# Patient Record
Sex: Male | Born: 1940
Health system: Southern US, Community
[De-identification: ages and names within clinical notes are randomized; demographics above are authoritative.]

## PROBLEM LIST (undated history)

## (undated) DIAGNOSIS — Z973 Presence of spectacles and contact lenses: Secondary | ICD-10-CM

## (undated) DIAGNOSIS — K08109 Complete loss of teeth, unspecified cause, unspecified class: Secondary | ICD-10-CM

## (undated) DIAGNOSIS — Z972 Presence of dental prosthetic device (complete) (partial): Secondary | ICD-10-CM

## (undated) DIAGNOSIS — Z87448 Personal history of other diseases of urinary system: Secondary | ICD-10-CM

## (undated) DIAGNOSIS — K219 Gastro-esophageal reflux disease without esophagitis: Secondary | ICD-10-CM

## (undated) DIAGNOSIS — Z87442 Personal history of urinary calculi: Secondary | ICD-10-CM

## (undated) DIAGNOSIS — K449 Diaphragmatic hernia without obstruction or gangrene: Secondary | ICD-10-CM

## (undated) DIAGNOSIS — E7800 Pure hypercholesterolemia, unspecified: Secondary | ICD-10-CM

## (undated) DIAGNOSIS — Z974 Presence of external hearing-aid: Secondary | ICD-10-CM

## (undated) DIAGNOSIS — N400 Enlarged prostate without lower urinary tract symptoms: Secondary | ICD-10-CM

## (undated) DIAGNOSIS — C679 Malignant neoplasm of bladder, unspecified: Secondary | ICD-10-CM

## (undated) HISTORY — DX: Pure hypercholesterolemia, unspecified: E78.00

## (undated) HISTORY — PX: OTHER SURGICAL HISTORY: SHX169

## (undated) HISTORY — PX: COLONOSCOPY WITH ESOPHAGOGASTRODUODENOSCOPY (EGD): SHX5779

## (undated) HISTORY — DX: Gastro-esophageal reflux disease without esophagitis: K21.9

## (undated) HISTORY — PX: VASECTOMY: SHX75

---

## 2001-01-21 ENCOUNTER — Encounter: Payer: Self-pay | Admitting: Urology

## 2001-01-21 ENCOUNTER — Ambulatory Visit (HOSPITAL_COMMUNITY): Admission: RE | Admit: 2001-01-21 | Discharge: 2001-01-21 | Payer: Self-pay | Admitting: Urology

## 2001-06-20 ENCOUNTER — Emergency Department (HOSPITAL_COMMUNITY): Admission: EM | Admit: 2001-06-20 | Discharge: 2001-06-21 | Payer: Self-pay | Admitting: Emergency Medicine

## 2001-06-21 ENCOUNTER — Encounter: Payer: Self-pay | Admitting: Emergency Medicine

## 2002-06-07 ENCOUNTER — Ambulatory Visit (HOSPITAL_COMMUNITY): Admission: RE | Admit: 2002-06-07 | Discharge: 2002-06-07 | Payer: Self-pay | Admitting: Internal Medicine

## 2003-05-31 ENCOUNTER — Observation Stay (HOSPITAL_COMMUNITY): Admission: RE | Admit: 2003-05-31 | Discharge: 2003-06-01 | Payer: Self-pay | Admitting: General Surgery

## 2005-05-23 ENCOUNTER — Ambulatory Visit (HOSPITAL_COMMUNITY): Admission: RE | Admit: 2005-05-23 | Discharge: 2005-05-23 | Payer: Self-pay | Admitting: Pulmonary Disease

## 2005-11-18 ENCOUNTER — Ambulatory Visit (HOSPITAL_COMMUNITY): Admission: RE | Admit: 2005-11-18 | Discharge: 2005-11-18 | Payer: Self-pay | Admitting: Pulmonary Disease

## 2006-01-01 ENCOUNTER — Ambulatory Visit: Payer: Self-pay | Admitting: Internal Medicine

## 2006-10-13 HISTORY — PX: UMBILICAL HERNIA REPAIR: SHX196

## 2011-05-12 ENCOUNTER — Other Ambulatory Visit (HOSPITAL_COMMUNITY): Payer: Self-pay | Admitting: Internal Medicine

## 2011-05-12 DIAGNOSIS — R17 Unspecified jaundice: Secondary | ICD-10-CM

## 2011-05-14 ENCOUNTER — Ambulatory Visit (HOSPITAL_COMMUNITY)
Admission: RE | Admit: 2011-05-14 | Discharge: 2011-05-14 | Disposition: A | Discharge status: Home / Self Care | Payer: Medicare Other | Source: Ambulatory Visit | Attending: Internal Medicine | Admitting: Internal Medicine

## 2011-05-14 DIAGNOSIS — K7689 Other specified diseases of liver: Secondary | ICD-10-CM | POA: Insufficient documentation

## 2011-05-14 DIAGNOSIS — R17 Unspecified jaundice: Secondary | ICD-10-CM

## 2011-05-26 ENCOUNTER — Ambulatory Visit (INDEPENDENT_AMBULATORY_CARE_PROVIDER_SITE_OTHER): Payer: Medicare Other | Admitting: Gastroenterology

## 2011-05-26 ENCOUNTER — Encounter: Payer: Self-pay | Admitting: Gastroenterology

## 2011-05-26 VITALS — BP 141/89 | HR 68 | Temp 97.8°F | Ht 68.0 in | Wt 173.0 lb

## 2011-05-26 DIAGNOSIS — R1031 Right lower quadrant pain: Secondary | ICD-10-CM | POA: Insufficient documentation

## 2011-05-26 DIAGNOSIS — R17 Unspecified jaundice: Secondary | ICD-10-CM | POA: Insufficient documentation

## 2011-05-26 NOTE — Progress Notes (Signed)
Cc to PCP 

## 2011-05-26 NOTE — Progress Notes (Signed)
Referring Provider: Dwana Melena, MD Primary Care Physician:  Dwana Melena, MD Primary Gastroenterologist:  Dr. Jena Gauss   Chief Complaint  Patient presents with  . Abdominal Pain  . Abnormal Lab    HPI:   [name] is a pleasant 70 year old Caucasian male who presents today at the request of Dr. Margo Aye secondary to elevated total bilirubin. All other LFTs normal. Tbili was 2.0 on July 2012. [place] of abdomen August 1 with question fatty infiltration of liver. CBD 3mm diameter normal.  Pt denies any fever or chills. Denies lack of appetite. Weight is stable. Reports RLQ discomfort, sometimes wrapping around to back, X 1 month. Intermittent, mainly with movement. Works part-time, does lifting/bending/pulling. Described as "having to go to bathroom". No alleviating factors. Takes Advil prn. Denies melena or brbpr.   Last colonoscopy in 2003 with normal rectum, scattered AVMs throughout sigmoid segment. Has distant family hx of colon cancer, with paternal grandfather.   Pt states prior to being told about "fatty liver", he did not notice the RLQ discomfort. States he wonders if he is worried about it now and notices it more.   Past Medical History  Diagnosis Date  . Kidney stones   . Bladder stone   . Hypercholesterolemia   . GERD (gastroesophageal reflux disease)   . S/P colonoscopy Aug 2003     Normal rectum. Scattered AVMs throughout sigmoid segment     Past Surgical History  Procedure Date  . Umbilical hernia repair   . Mole removal   . Cystoscopy     Current Outpatient Prescriptions  Medication Sig Dispense Refill  . aspirin 325 MG tablet Take 325 mg by mouth daily.        . niacin (NIASPAN) 500 MG CR tablet Take 500 mg by mouth at bedtime.        Marland Kitchen omeprazole (PRILOSEC) 20 MG capsule Take 20 mg by mouth daily.          Allergies as of 05/26/2011  . (No Known Allergies)    Family History  Problem Relation Age of Onset  . Colon cancer Paternal Grandfather     History    Social History  . Marital Status: Single    Spouse Name: N/A    Number of Children: N/A  . Years of Education: N/A   Occupational History  . Not on file.   Social History Main Topics  . Smoking status: Former Games developer  . Smokeless tobacco: Not on file   Comment: quit around 1998  . Alcohol Use: Yes     Socially  . Drug Use: Not on file  . Sexually Active: Not on file    Review of Systems: Gen: Denies any fever, chills, loss of appetite, fatigue, weight loss. CV: Denies chest pain, heart palpitations, syncope, peripheral edema. Resp: Denies shortness of breath with rest, cough, wheezing GI: Denies dysphagia or odynophagia. Denies hematemesis, fecal incontinence, or jaundice.  GU : Denies urinary burning, urinary frequency, urinary incontinence.  MS: Denies joint pain, muscle weakness, cramps, limited movement Derm: Denies rash, itching, dry skin Psych: Denies depression, anxiety, confusion or memory loss  Heme: Denies bruising, bleeding, and enlarged lymph nodes.  Physical Exam: BP 141/89  Pulse 68  Temp(Src) 97.8 F (36.6 C) (Temporal)  Ht 5\' 8"  (1.727 m)  Wt 173 lb (78.472 kg)  BMI 26.30 kg/m2 General:   Alert and oriented. Well-developed, well-nourished, pleasant and cooperative. Head:  Normocephalic and atraumatic. Eyes:  Conjunctiva pink, sclera clear, no icterus.   Conjunctiva pink.  Ears:  Normal auditory acuity. Nose:  No deformity, discharge,  or lesions. Mouth:  No deformity or lesions, mucosa pink and moist.  Neck:  Supple, without mass or thyromegaly. Lungs:  Clear to auscultation bilaterally, without wheezing, rales, or rhonchi.  Heart:  S1, S2 present without murmurs noted.  Abdomen:  +BS, soft, mildly obese, non-tender and non-distended. Without mass or HSM. No rebound or guarding. No hernias noted. No CVA tenderness.  Rectal:  Deferred  Msk:  Symmetrical without gross deformities. Normal posture. Extremities:  Without clubbing or edema. Neurologic:   Alert and  oriented x4;  grossly normal neurologically. Skin:  Intact, warm and dry without significant lesions or rashes Cervical Nodes:  No significant cervical adenopathy. Psych:  Alert and cooperative. Normal mood and affect.

## 2011-05-26 NOTE — Patient Instructions (Signed)
Please complete stool sample and return to our office. We may be proceeding with a colonoscopy if indicated. Also, complete labs. We will contact you with the results and further plan.   Please start incorporating high fiber foods to your diet. See handout.   Recommend 1-2# weight loss per week until ideal body weight through exercise & diet. Low fat/cholesterol diet. Gradually increase exercise from 15 min daily up to 1 hr per day 5 days/week. Limit alcohol use.   We will see you back in 6 months. If anything needs to be done in interim after review of labs/stool sample, we will do so.   Thanks!

## 2011-05-26 NOTE — Assessment & Plan Note (Signed)
70 year old Caucasian male with only elevated bilirubin at 2.0 on recent CMP. All other LFTs completely normal. [place] of abdomen with fatty liver, no CBD dilation, stones. RLQ discomfort, wrapping to back intermittently, X 1 month, worsened with activity. No melena or brbpr. No change in bowel habits. Otherwise asymptomatic. No wt loss, fever, chills, jaundice. Will proceed with hepatic function panel. Question of Gilbert's syndrome remains in differential. Fatty liver on [place] with need for dietary/behavior modification. Will proceed with further work-up as needed after review of updated HFP. RLQ etiology unclear at this time; addressed under "RLQ pain".   HFP Recommend 1-2# weight loss per week until ideal body weight through exercise & diet. Low fat/cholesterol diet. Gradually increase exercise from 15 min daily up to 1 hr per day 5 days/week. Limit alcohol use. Further recommendations after labs reviewed. Consider viral markers, etc.

## 2011-05-26 NOTE — Assessment & Plan Note (Signed)
Intermittent X 1 month, sometimes wrapping around to back. Worsened with movement. Works part-time doing bending/lifting/pulling. No change in bowel habits, but does report it feeling like "has to have a BM". No alleviating factors. No melena or brbpr. Question musculoskeletal etiology vs GI. Last colonoscopy in 2003. Distant hx of colon cancer in family (paternal grandfather). Obtain ifobt. Consider colonoscopy in near future vs CT scan, depending on ifobt findings. In interim, high fiber diet. Further recommendations to follow.  6 months follow-up.

## 2011-05-29 ENCOUNTER — Ambulatory Visit (INDEPENDENT_AMBULATORY_CARE_PROVIDER_SITE_OTHER): Payer: Medicare Other | Admitting: Gastroenterology

## 2011-05-29 DIAGNOSIS — R109 Unspecified abdominal pain: Secondary | ICD-10-CM

## 2011-05-29 NOTE — Progress Notes (Signed)
Noted.   Has pt completed labs (HFP?)

## 2011-06-02 NOTE — Progress Notes (Signed)
Mailed letter to pt

## 2011-06-03 LAB — HEPATIC FUNCTION PANEL
ALT: 17 U/L (ref 0–53)
Alkaline Phosphatase: 70 U/L (ref 39–117)
Indirect Bilirubin: 2 mg/dL — ABNORMAL HIGH (ref 0.0–0.9)
Total Protein: 6.5 g/dL (ref 6.0–8.3)

## 2011-06-10 NOTE — Progress Notes (Signed)
Quick Note:  Tried to call pt- NA ______ 

## 2011-06-11 ENCOUNTER — Telehealth: Payer: Self-pay

## 2011-06-11 ENCOUNTER — Other Ambulatory Visit: Payer: Self-pay

## 2011-06-11 DIAGNOSIS — R945 Abnormal results of liver function studies: Secondary | ICD-10-CM

## 2011-06-11 NOTE — Telephone Encounter (Signed)
Pt called and was informed of his iFOBT results negative, and also of his LFT's. He is aware to repeat labs in 3 months. He said he is not having pain now and is doing much better. Lab orders on file.

## 2011-06-17 ENCOUNTER — Other Ambulatory Visit: Payer: Self-pay | Admitting: Gastroenterology

## 2011-07-29 ENCOUNTER — Telehealth: Payer: Self-pay | Admitting: Gastroenterology

## 2011-07-29 NOTE — Telephone Encounter (Signed)
Pt came to window wanting to set up CT. He was told if he continued having abd pain to call or come by and we would set him up. I told [name] that I would have someone call him or he could wait. He said to call him at [phone] or [phone]

## 2011-07-30 NOTE — Telephone Encounter (Signed)
Pt came by to schedule CT- would you like for me to schedule it or have him come in for an OV and do it at that time

## 2011-07-30 NOTE — Telephone Encounter (Signed)
If he is agreeable, set up CT prior to OV so results may be reviewed at that time.  Have HFP, Creatinine drawn prior to CT.   Continue high fiber diet Miralax 17 grams daily as needed for BM This way, if colonoscopy needs to be set up at our appt, we can do so then.

## 2011-07-31 ENCOUNTER — Other Ambulatory Visit: Payer: Self-pay | Admitting: Gastroenterology

## 2011-07-31 NOTE — Telephone Encounter (Signed)
Pt advised of plan - will have blood work prior to CT and OV after CT-

## 2011-07-31 NOTE — Telephone Encounter (Signed)
CD has informed pt. Lab order faxed to lab.

## 2011-07-31 NOTE — Telephone Encounter (Signed)
Tried to call pt- LMOM 

## 2011-08-01 ENCOUNTER — Other Ambulatory Visit: Payer: Self-pay | Admitting: Gastroenterology

## 2011-08-02 LAB — HEPATIC FUNCTION PANEL
Alkaline Phosphatase: 81 U/L (ref 39–117)
Bilirubin, Direct: 0.4 mg/dL — ABNORMAL HIGH (ref 0.0–0.3)
Indirect Bilirubin: 1.9 mg/dL — ABNORMAL HIGH (ref 0.0–0.9)
Total Protein: 7 g/dL (ref 6.0–8.3)

## 2011-08-05 ENCOUNTER — Other Ambulatory Visit: Payer: Self-pay | Admitting: Gastroenterology

## 2011-08-05 DIAGNOSIS — R109 Unspecified abdominal pain: Secondary | ICD-10-CM

## 2011-08-05 NOTE — Telephone Encounter (Signed)
CT scheduled for 08/07/11 @ 11:00- LMOM

## 2011-08-07 ENCOUNTER — Telehealth: Payer: Self-pay | Admitting: Gastroenterology

## 2011-08-07 ENCOUNTER — Ambulatory Visit (HOSPITAL_COMMUNITY): Payer: Medicare Other

## 2011-08-07 NOTE — Telephone Encounter (Signed)
Pt is scheduled for CT on 11/01

## 2011-08-07 NOTE — Telephone Encounter (Signed)
Did pt reschedule CT or not show for the scheduled appt?

## 2011-08-14 ENCOUNTER — Ambulatory Visit (HOSPITAL_COMMUNITY)
Admission: RE | Admit: 2011-08-14 | Discharge: 2011-08-14 | Disposition: A | Discharge status: Home / Self Care | Payer: Medicare Other | Source: Ambulatory Visit | Attending: Gastroenterology | Admitting: Gastroenterology

## 2011-08-14 ENCOUNTER — Other Ambulatory Visit: Payer: Self-pay | Admitting: Gastroenterology

## 2011-08-14 ENCOUNTER — Ambulatory Visit (HOSPITAL_COMMUNITY)
Admission: RE | Admit: 2011-08-14 | Discharge: 2011-08-14 | Disposition: A | Discharge status: Home / Self Care | Payer: Medicare Other | Source: Ambulatory Visit | Attending: Internal Medicine | Admitting: Internal Medicine

## 2011-08-14 DIAGNOSIS — R109 Unspecified abdominal pain: Secondary | ICD-10-CM

## 2011-08-14 DIAGNOSIS — R1031 Right lower quadrant pain: Secondary | ICD-10-CM

## 2011-08-14 DIAGNOSIS — M5126 Other intervertebral disc displacement, lumbar region: Secondary | ICD-10-CM | POA: Insufficient documentation

## 2011-08-14 DIAGNOSIS — N400 Enlarged prostate without lower urinary tract symptoms: Secondary | ICD-10-CM | POA: Insufficient documentation

## 2011-08-14 MED ORDER — IOHEXOL 300 MG/ML  SOLN
100.0000 mL | Freq: Once | INTRAMUSCULAR | Status: AC | PRN
  Administered 2011-08-14: 100 mL via INTRAVENOUS

## 2011-08-15 NOTE — Progress Notes (Signed)
Quick Note:  Similar to 2 mos ago. CT without significant findings. Let's get a CBC on him. I thought we had one on file. How is RLQ discomfort? Bowel movements? Offer f/u visit if needed (was to be seen in 6 mos which would be Jan, but can move up if needed). ______

## 2011-08-15 NOTE — Progress Notes (Signed)
Quick Note:  Normal. Only mild enlargement of prostate. Please have pt follow-up with his PCP regarding this. ______

## 2011-08-18 ENCOUNTER — Other Ambulatory Visit: Payer: Self-pay | Admitting: Gastroenterology

## 2011-08-28 ENCOUNTER — Other Ambulatory Visit: Payer: Self-pay | Admitting: Gastroenterology

## 2011-08-29 LAB — CBC WITH DIFFERENTIAL/PLATELET
Basophils Relative: 0 % (ref 0–1)
Eosinophils Absolute: 0.2 10*3/uL (ref 0.0–0.7)
HCT: 45.8 % (ref 39.0–52.0)
Hemoglobin: 15 g/dL (ref 13.0–17.0)
Lymphs Abs: 1.9 10*3/uL (ref 0.7–4.0)
MCH: 31.4 pg (ref 26.0–34.0)
MCHC: 32.8 g/dL (ref 30.0–36.0)
MCV: 96 fL (ref 78.0–100.0)
Monocytes Absolute: 0.5 10*3/uL (ref 0.1–1.0)
Monocytes Relative: 7 % (ref 3–12)
Neutrophils Relative %: 64 % (ref 43–77)
RBC: 4.77 MIL/uL (ref 4.22–5.81)

## 2011-09-08 NOTE — Progress Notes (Signed)
Quick Note:  No anemia. Offer f/u visit to pt if needed, especially if still having RLQ pain. CT without significant findings. ______

## 2011-09-09 NOTE — Progress Notes (Signed)
Quick Note:  Tried to call pt- LMOM ______ 

## 2011-11-17 ENCOUNTER — Encounter: Payer: Self-pay | Admitting: Internal Medicine

## 2012-02-09 ENCOUNTER — Encounter: Payer: Self-pay | Admitting: Gastroenterology

## 2012-02-09 NOTE — Progress Notes (Unsigned)
Needs OV to set up TCS.  Hx of Gilbert's.

## 2012-03-05 ENCOUNTER — Encounter: Payer: Self-pay | Admitting: Gastroenterology

## 2012-03-05 NOTE — Progress Notes (Signed)
Pt is aware of OV on 6/14 at 11 with KJ and appt card was mailed

## 2012-03-26 ENCOUNTER — Encounter: Payer: Self-pay | Admitting: Urgent Care

## 2012-03-26 ENCOUNTER — Ambulatory Visit (INDEPENDENT_AMBULATORY_CARE_PROVIDER_SITE_OTHER): Payer: Medicare Other | Admitting: Urgent Care

## 2012-03-26 ENCOUNTER — Other Ambulatory Visit: Payer: Self-pay | Admitting: Internal Medicine

## 2012-03-26 VITALS — BP 134/87 | HR 67 | Temp 98.2°F | Ht 69.0 in | Wt 172.6 lb

## 2012-03-26 DIAGNOSIS — R109 Unspecified abdominal pain: Secondary | ICD-10-CM

## 2012-03-26 DIAGNOSIS — R1031 Right lower quadrant pain: Secondary | ICD-10-CM

## 2012-03-26 DIAGNOSIS — K219 Gastro-esophageal reflux disease without esophagitis: Secondary | ICD-10-CM | POA: Insufficient documentation

## 2012-03-26 DIAGNOSIS — Z1211 Encounter for screening for malignant neoplasm of colon: Secondary | ICD-10-CM

## 2012-03-26 DIAGNOSIS — R17 Unspecified jaundice: Secondary | ICD-10-CM

## 2012-03-26 MED ORDER — OMEPRAZOLE 20 MG PO CPDR: 20.0000 mg | DELAYED_RELEASE_CAPSULE | Freq: Every day | ORAL | Status: DC

## 2012-03-26 MED ORDER — PEG 3350-KCL-NA BICARB-NACL 420 G PO SOLR: ORAL | Status: AC

## 2012-03-26 NOTE — Progress Notes (Signed)
Primary Care Physician:  HALL,ZACK, MD Primary Gastroenterologist:  Dr. Rourk  Chief Complaint  Patient presents with  . Colonoscopy    HPI:  [name] is a 70 y.o. male here to set up screening colonoscopy.  He had previously been seen by Anna Sams, NP & had some abdominal pain.  This has completely resolved.  Ultrasound showed fatty liver 05/2011.  CT A/P showed prostatomegaly, but was otherwise normal.  Hx indirect hyperbilirubinemia.  He feels very well.  Hx chronic GERD well controlled on  omeprazole 20mg daily.  Denies dysphagia or odynophagia.   Denies any lower GI symptoms including constipation, diarrhea, rectal bleeding, melena or weight loss. Component     Latest Ref Rng 05/26/2011 08/01/2011  Total Bilirubin     0.3 - 1.2 mg/dL 2.3 (H) 2.3 (H)  Bilirubin, Direct     0.0 - 0.3 mg/dL 0.3 0.4 (H)  Indirect Bilirubin     0.0 - 0.9 mg/dL 2.0 (H) 1.9 (H)  Alkaline Phosphatase     39 - 117 U/L 70 81  AST     0 - 37 U/L 19 22  ALT     0 - 53 U/L 17 23  Total Protein     6.0 - 8.3 g/dL 6.5 7.0  Albumin     3.5 - 5.2 g/dL 4.4 4.8     Past Medical History  Diagnosis Date  . Kidney stones   . Bladder stone   . Hypercholesterolemia   . GERD (gastroesophageal reflux disease)   . S/P colonoscopy Aug 2003     Normal rectum. Scattered AVMs throughout sigmoid segment   . Fatty liver     Past Surgical History  Procedure Date  . Umbilical hernia repair 2008  . Mole removal   . Cystoscopy     Current Outpatient Prescriptions  Medication Sig Dispense Refill  . aspirin 325 MG tablet Take 325 mg by mouth daily.        . fenofibrate (TRICOR) 145 MG tablet Take 1 capsule by mouth Daily.      . niacin (NIASPAN) 500 MG CR tablet Take 500 mg by mouth at bedtime.        . omeprazole (PRILOSEC) 20 MG capsule Take 1 capsule (20 mg total) by mouth daily.  90 capsule  3  . DISCONTD: omeprazole (PRILOSEC) 20 MG capsule Take 20 mg by mouth daily.        . polyethylene  glycol-electrolytes (TRILYTE) 420 G solution Use as directed Also buy 1 fleet enema & 4 dulcolax tablets to use as directed  4000 mL  0    Allergies as of 03/26/2012  . (No Known Allergies)    Family History  Problem Relation Age of Onset  . Colon cancer Paternal Grandfather     History   Social History  . Marital Status: Single    Spouse Name: N/A    Number of Children: 2  . Years of Education: N/A   Occupational History  . retired, [employer]    Social History Main Topics  . Smoking status: Former Smoker    Quit date: 03/26/1998  . Smokeless tobacco: Not on file   Comment: quit around 1998  . Alcohol Use: Yes     Socially beer couple times week  . Drug Use: No  . Sexually Active: Not on file   Other Topics Concern  . Not on file   Social History Narrative   Lives w/ wife      Review of Systems: Gen: Denies any fever, chills, sweats, anorexia, fatigue, weakness, malaise, weight loss, and sleep disorder CV: Denies chest pain, angina, palpitations, syncope, orthopnea, PND, peripheral edema, and claudication. Resp: Denies dyspnea at rest, dyspnea with exercise, cough, sputum, wheezing, coughing up blood, and pleurisy. GI: Denies vomiting blood, jaundice, and fecal incontinence. GU : Denies urinary burning, blood in urine, urinary frequency, urinary hesitancy, nocturnal urination, and urinary incontinence. MS: Denies joint pain, limitation of movement, and swelling, stiffness, low back pain, extremity pain. Denies muscle weakness, cramps, atrophy.  Derm: Denies rash, itching, dry skin, hives, moles, warts, or unhealing ulcers.  Psych: Denies depression, anxiety, memory loss, suicidal ideation, hallucinations, paranoia, and confusion. Heme: Denies bruising, bleeding, and enlarged lymph nodes. Neuro:  Denies any headaches, dizziness, paresthesias. Endo:  Denies any problems with DM, thyroid, adrenal function.  Physical Exam: BP 134/87  Pulse 67  Temp 98.2 F (36.8  C) (Temporal)  Ht 5' 9" (1.753 m)  Wt 172 lb 9.6 oz (78.291 kg)  BMI 25.49 kg/m2 General:   Alert,  Well-developed, well-nourished, pleasant and cooperative in NAD Head:  Normocephalic and atraumatic. Eyes:  Sclera clear, no icterus.   Conjunctiva pink. Ears:  Normal auditory acuity. Nose:  No deformity, discharge, or lesions. Mouth:  No deformity or lesions,oropharynx pink & moist. Neck:  Supple; no masses or thyromegaly. Lungs:  Clear throughout to auscultation.   No wheezes, crackles, or rhonchi. No acute distress. Heart:  Regular rate and rhythm; no murmurs, clicks, rubs,  or gallops. Abdomen:  Normal bowel sounds.  No bruits.  Soft, non-tender and non-distended without masses, hepatosplenomegaly or hernias noted.  No guarding or rebound tenderness.   Rectal:  Deferred. Msk:  Symmetrical without gross deformities. Normal posture. Pulses:  Normal pulses noted. Extremities:  No clubbing or edema. Neurologic:  Alert and  oriented x4;  grossly normal neurologically. Skin:  Intact without significant lesions or rashes. Lymph Nodes:  No significant cervical adenopathy. Psych:  Alert and cooperative. Normal mood and affect.   

## 2012-03-26 NOTE — Assessment & Plan Note (Signed)
[name] is a pleasant 71 y.o. male here to set up screening colonoscopy.  I have discussed risks & benefits which include, but are not limited to, bleeding, infection, perforation & drug reaction.  The patient agrees with this plan & written consent will be obtained.

## 2012-03-26 NOTE — Assessment & Plan Note (Signed)
Resolved

## 2012-03-26 NOTE — Patient Instructions (Addendum)
Omeprazole 20mg  daily Screening colonoscopy & EGD w/ Dr Jena Gauss in the near future

## 2012-03-26 NOTE — Assessment & Plan Note (Addendum)
Chronic GERD well controlled on omeprazole 20mg  daily.  EGD to screen for Barrett's esophagus at the same time as colonoscopy.  I have discussed risks & benefits which include, but are not limited to, bleeding, infection, perforation & drug reaction.  The patient agrees with this plan & written consent will be obtained.

## 2012-03-26 NOTE — Assessment & Plan Note (Signed)
Indirect hyperbilirubinemia (Gilbert's) in setting of fatty liver.

## 2012-03-29 ENCOUNTER — Telehealth: Payer: Self-pay | Admitting: General Practice

## 2012-03-29 NOTE — Telephone Encounter (Signed)
I spoke with [name] and explained to him that his procedure is covered, however he has a $3,900.00 yearly deductible that he has only met $74.00 of.    He stated he would like to proceed with his procedure.   I confirmed insurance benefits with April.

## 2012-03-30 ENCOUNTER — Encounter (HOSPITAL_COMMUNITY): Payer: Self-pay | Admitting: Pharmacy Technician

## 2012-04-01 NOTE — Progress Notes (Signed)
Faxed to PCP

## 2012-04-08 ENCOUNTER — Ambulatory Visit (HOSPITAL_COMMUNITY)
Admission: RE | Admit: 2012-04-08 | Discharge: 2012-04-08 | Disposition: A | Discharge status: Home / Self Care | Payer: Medicare Other | Source: Ambulatory Visit | Attending: Internal Medicine | Admitting: Internal Medicine

## 2012-04-08 ENCOUNTER — Encounter (HOSPITAL_COMMUNITY): Payer: Self-pay

## 2012-04-08 ENCOUNTER — Encounter (HOSPITAL_COMMUNITY)
Admission: RE | Disposition: A | Discharge status: Home / Self Care | Payer: Self-pay | Source: Ambulatory Visit | Attending: Internal Medicine

## 2012-04-08 DIAGNOSIS — E7800 Pure hypercholesterolemia, unspecified: Secondary | ICD-10-CM | POA: Insufficient documentation

## 2012-04-08 DIAGNOSIS — Z1211 Encounter for screening for malignant neoplasm of colon: Secondary | ICD-10-CM

## 2012-04-08 DIAGNOSIS — D126 Benign neoplasm of colon, unspecified: Secondary | ICD-10-CM | POA: Insufficient documentation

## 2012-04-08 DIAGNOSIS — K621 Rectal polyp: Secondary | ICD-10-CM

## 2012-04-08 DIAGNOSIS — D128 Benign neoplasm of rectum: Secondary | ICD-10-CM | POA: Insufficient documentation

## 2012-04-08 DIAGNOSIS — K620 Anal polyp: Secondary | ICD-10-CM

## 2012-04-08 DIAGNOSIS — K2960 Other gastritis without bleeding: Secondary | ICD-10-CM

## 2012-04-08 DIAGNOSIS — K449 Diaphragmatic hernia without obstruction or gangrene: Secondary | ICD-10-CM | POA: Insufficient documentation

## 2012-04-08 DIAGNOSIS — K219 Gastro-esophageal reflux disease without esophagitis: Secondary | ICD-10-CM

## 2012-04-08 DIAGNOSIS — R109 Unspecified abdominal pain: Secondary | ICD-10-CM

## 2012-04-08 SURGERY — COLONOSCOPY WITH ESOPHAGOGASTRODUODENOSCOPY (EGD)
Anesthesia: Moderate Sedation

## 2012-04-08 MED ORDER — BUTAMBEN-TETRACAINE-BENZOCAINE 2-2-14 % EX AERO
INHALATION_SPRAY | CUTANEOUS | Status: DC | PRN
  Administered 2012-04-08: 2 via TOPICAL

## 2012-04-08 MED ORDER — MIDAZOLAM HCL 5 MG/5ML IJ SOLN
INTRAMUSCULAR | Status: DC | PRN
  Administered 2012-04-08: 1 mg via INTRAVENOUS
  Administered 2012-04-08: 2 mg via INTRAVENOUS
  Administered 2012-04-08: 1 mg via INTRAVENOUS
  Administered 2012-04-08: 2 mg via INTRAVENOUS

## 2012-04-08 MED ORDER — STERILE WATER FOR IRRIGATION IR SOLN
Status: DC | PRN
  Administered 2012-04-08: 07:00:00

## 2012-04-08 MED ORDER — MEPERIDINE HCL 100 MG/ML IJ SOLN
INTRAMUSCULAR | Status: AC
  Filled 2012-04-08: qty 2

## 2012-04-08 MED ORDER — SODIUM CHLORIDE 0.45 % IV SOLN
Freq: Once | INTRAVENOUS | Status: AC
  Administered 2012-04-08: 07:00:00 via INTRAVENOUS

## 2012-04-08 MED ORDER — MIDAZOLAM HCL 5 MG/5ML IJ SOLN
INTRAMUSCULAR | Status: AC
  Filled 2012-04-08: qty 10

## 2012-04-08 MED ORDER — MEPERIDINE HCL 100 MG/ML IJ SOLN
INTRAMUSCULAR | Status: DC | PRN
  Administered 2012-04-08: 50 mg via INTRAVENOUS
  Administered 2012-04-08 (×2): 25 mg via INTRAVENOUS

## 2012-04-08 NOTE — Op Note (Signed)
[facility] [address] [place], [place]  [address]  ENDOSCOPY PROCEDURE REPORT  PATIENT:  [name], [name]  MR#:  [id number] BIRTHDATE:  09/25/1941, 70 yrs. old  GENDER:  male  ENDOSCOPIST:  R. Roetta Sessions, MD Caleen Essex Referred by:  Catalina Pizza, M.D.  PROCEDURE DATE:  04/08/2012 PROCEDURE:  EGD with gastric biopsy  INDICATIONS:   screening-long-standing GERD  INFORMED CONSENT:   The risks, benefits, limitations, alternatives and imponderables have been discussed.  The potential for biopsy, esophogeal dilation, etc. have also been reviewed.  Questions have been answered.  All parties agreeable.  Please see the history and physical in the medical record for more information.  MEDICATIONS:     Versed 5 mg IV and Demerol 100 mg IV in divided doses. Cetacaine spray  DESCRIPTION OF PROCEDURE:   The [id number]-[id number] ([id number]) endoscope was introduced through the mouth and advanced to the second portion of the duodenum without difficulty or limitations.  The mucosal surfaces were surveyed very carefully during advancement of the scope and upon withdrawal.  Retroflexion view of the proximal stomach and esophagogastric junction was performed.  <<PROCEDUREIMAGES>>  FINDINGS:  Accentuated undulating Z line; otherwise normal esophagus. No Barrett's. Stomach empty. Small hiatal hernia. Focal antral     erosions and submucosal petechiae. No infiltrating process or ulcer seen. Pylorus patent. First and second portion of the     duodenum appeared normal.  THERAPEUTIC / DIAGNOSTIC MANEUVERS PERFORMED:  Biopsies of the gastric antrum taken  COMPLICATIONS:   None  IMPRESSION:    Small hiatal hernia. Abnormal gastric mucosa of uncertain significance as described above-status post  RECOMMENDATIONS:  Followup on pathology. See colonoscopy report.  ______________________________ R. Roetta Sessions, MD Caleen Essex  CC:  n. eSIGNED:   R. Casimiro Needle Kebron Pulse at 04/08/2012 08:09 AM  Bunnie Philips, [phone]

## 2012-04-08 NOTE — H&P (View-Only) (Signed)
Primary Care Physician:  Dwana Melena, MD Primary Gastroenterologist:  Dr. Jena Gauss  Chief Complaint  Patient presents with  . Colonoscopy    HPI:  [name] is a 71 y.o. male here to set up screening colonoscopy.  He had previously been seen by Damon Halls, NP & had some abdominal pain.  This has completely resolved.  Ultrasound showed fatty liver 05/2011.  CT A/P showed prostatomegaly, but was otherwise normal.  Hx indirect hyperbilirubinemia.  He feels very well.  Hx chronic GERD well controlled on  omeprazole 20mg  daily.  Denies dysphagia or odynophagia.   Denies any lower GI symptoms including constipation, diarrhea, rectal bleeding, melena or weight loss. Component     Latest Ref Rng 05/26/2011 08/01/2011  Total Bilirubin     0.3 - 1.2 mg/dL 2.3 (H) 2.3 (H)  Bilirubin, Direct     0.0 - 0.3 mg/dL 0.3 0.4 (H)  Indirect Bilirubin     0.0 - 0.9 mg/dL 2.0 (H) 1.9 (H)  Alkaline Phosphatase     39 - 117 U/L 70 81  AST     0 - 37 U/L 19 22  ALT     0 - 53 U/L 17 23  Total Protein     6.0 - 8.3 g/dL 6.5 7.0  Albumin     3.5 - 5.2 g/dL 4.4 4.8     Past Medical History  Diagnosis Date  . Kidney stones   . Bladder stone   . Hypercholesterolemia   . GERD (gastroesophageal reflux disease)   . S/P colonoscopy Aug 2003     Normal rectum. Scattered AVMs throughout sigmoid segment   . Fatty liver     Past Surgical History  Procedure Date  . Umbilical hernia repair 2008  . Mole removal   . Cystoscopy     Current Outpatient Prescriptions  Medication Sig Dispense Refill  . aspirin 325 MG tablet Take 325 mg by mouth daily.        . fenofibrate (TRICOR) 145 MG tablet Take 1 capsule by mouth Daily.      . niacin (NIASPAN) 500 MG CR tablet Take 500 mg by mouth at bedtime.        Marland Kitchen omeprazole (PRILOSEC) 20 MG capsule Take 1 capsule (20 mg total) by mouth daily.  90 capsule  3  . DISCONTD: omeprazole (PRILOSEC) 20 MG capsule Take 20 mg by mouth daily.        . polyethylene  glycol-electrolytes (TRILYTE) 420 G solution Use as directed Also buy 1 fleet enema & 4 dulcolax tablets to use as directed  4000 mL  0    Allergies as of 03/26/2012  . (No Known Allergies)    Family History  Problem Relation Age of Onset  . Colon cancer Paternal Grandfather     History   Social History  . Marital Status: Single    Spouse Name: N/A    Number of Children: 2  . Years of Education: N/A   Occupational History  . retired, [employer]    Social History Main Topics  . Smoking status: Former Smoker    Quit date: 03/26/1998  . Smokeless tobacco: Not on file   Comment: quit around 1998  . Alcohol Use: Yes     Socially beer couple times week  . Drug Use: No  . Sexually Active: Not on file   Other Topics Concern  . Not on file   Social History Narrative   Lives w/ wife  Review of Systems: Gen: Denies any fever, chills, sweats, anorexia, fatigue, weakness, malaise, weight loss, and sleep disorder CV: Denies chest pain, angina, palpitations, syncope, orthopnea, PND, peripheral edema, and claudication. Resp: Denies dyspnea at rest, dyspnea with exercise, cough, sputum, wheezing, coughing up blood, and pleurisy. GI: Denies vomiting blood, jaundice, and fecal incontinence. GU : Denies urinary burning, blood in urine, urinary frequency, urinary hesitancy, nocturnal urination, and urinary incontinence. MS: Denies joint pain, limitation of movement, and swelling, stiffness, low back pain, extremity pain. Denies muscle weakness, cramps, atrophy.  Derm: Denies rash, itching, dry skin, hives, moles, warts, or unhealing ulcers.  Psych: Denies depression, anxiety, memory loss, suicidal ideation, hallucinations, paranoia, and confusion. Heme: Denies bruising, bleeding, and enlarged lymph nodes. Neuro:  Denies any headaches, dizziness, paresthesias. Endo:  Denies any problems with DM, thyroid, adrenal function.  Physical Exam: BP 134/87  Pulse 67  Temp 98.2 F (36.8  C) (Temporal)  Ht 5\' 9"  (1.753 m)  Wt 172 lb 9.6 oz (78.291 kg)  BMI 25.49 kg/m2 General:   Alert,  Well-developed, well-nourished, pleasant and cooperative in NAD Head:  Normocephalic and atraumatic. Eyes:  Sclera clear, no icterus.   Conjunctiva pink. Ears:  Normal auditory acuity. Nose:  No deformity, discharge, or lesions. Mouth:  No deformity or lesions,oropharynx pink & moist. Neck:  Supple; no masses or thyromegaly. Lungs:  Clear throughout to auscultation.   No wheezes, crackles, or rhonchi. No acute distress. Heart:  Regular rate and rhythm; no murmurs, clicks, rubs,  or gallops. Abdomen:  Normal bowel sounds.  No bruits.  Soft, non-tender and non-distended without masses, hepatosplenomegaly or hernias noted.  No guarding or rebound tenderness.   Rectal:  Deferred. Msk:  Symmetrical without gross deformities. Normal posture. Pulses:  Normal pulses noted. Extremities:  No clubbing or edema. Neurologic:  Alert and  oriented x4;  grossly normal neurologically. Skin:  Intact without significant lesions or rashes. Lymph Nodes:  No significant cervical adenopathy. Psych:  Alert and cooperative. Normal mood and affect.

## 2012-04-08 NOTE — Discharge Instructions (Signed)
Colonoscopy Discharge Instructions  Read the instructions outlined below and refer to this sheet in the next few weeks. These discharge instructions provide you with general information on caring for yourself after you leave the hospital. Your doctor may also give you specific instructions. While your treatment has been planned according to the most current medical practices available, unavoidable complications occasionally occur. If you have any problems or questions after discharge, call Dr. Jena Gauss at [phone]. ACTIVITY  You may resume your regular activity, but move at a slower pace for the next 24 hours.   Take frequent rest periods for the next 24 hours.   Walking will help get rid of the air and reduce the bloated feeling in your belly (abdomen).   No driving for 24 hours (because of the medicine (anesthesia) used during the test).    Do not sign any important legal documents or operate any machinery for 24 hours (because of the anesthesia used during the test).  NUTRITION  Drink plenty of fluids.   You may resume your normal diet as instructed by your doctor.   Begin with a light meal and progress to your normal diet. Heavy or fried foods are harder to digest and may make you feel sick to your stomach (nauseated).   Avoid alcoholic beverages for 24 hours or as instructed.  MEDICATIONS  You may resume your normal medications unless your doctor tells you otherwise.  WHAT YOU CAN EXPECT TODAY  Some feelings of bloating in the abdomen.   Passage of more gas than usual.   Spotting of blood in your stool or on the toilet paper.  IF YOU HAD POLYPS REMOVED DURING THE COLONOSCOPY:  No aspirin products for 7 days or as instructed.   No alcohol for 7 days or as instructed.   Eat a soft diet for the next 24 hours.  FINDING OUT THE RESULTS OF YOUR TEST Not all test results are available during your visit. If your test results are not back during the visit, make an appointment  with your caregiver to find out the results. Do not assume everything is normal if you have not heard from your caregiver or the medical facility. It is important for you to follow up on all of your test results.  SEEK IMMEDIATE MEDICAL ATTENTION IF:  You have more than a spotting of blood in your stool.   Your belly is swollen (abdominal distention).   You are nauseated or vomiting.   You have a temperature over 101.   You have abdominal pain or discomfort that is severe or gets worse throughout the day.    Colonoscopy Discharge Instructions  Read the instructions outlined below and refer to this sheet in the next few weeks. These discharge instructions provide you with general information on caring for yourself after you leave the hospital. Your doctor may also give you specific instructions. While your treatment has been planned according to the most current medical practices available, unavoidable complications occasionally occur. If you have any problems or questions after discharge, call Dr. Jena Gauss at [phone]. ACTIVITY  You may resume your regular activity, but move at a slower pace for the next 24 hours.   Take frequent rest periods for the next 24 hours.   Walking will help get rid of the air and reduce the bloated feeling in your belly (abdomen).   No driving for 24 hours (because of the medicine (anesthesia) used during the test).    Do not sign any important legal  documents or operate any machinery for 24 hours (because of the anesthesia used during the test).  NUTRITION  Drink plenty of fluids.   You may resume your normal diet as instructed by your doctor.   Begin with a light meal and progress to your normal diet. Heavy or fried foods are harder to digest and may make you feel sick to your stomach (nauseated).   Avoid alcoholic beverages for 24 hours or as instructed.  MEDICATIONS  You may resume your normal medications unless your doctor tells you otherwise.    WHAT YOU CAN EXPECT TODAY  Some feelings of bloating in the abdomen.   Passage of more gas than usual.   Spotting of blood in your stool or on the toilet paper.  IF YOU HAD POLYPS REMOVED DURING THE COLONOSCOPY:  No aspirin products for 7 days or as instructed.   No alcohol for 7 days or as instructed.   Eat a soft diet for the next 24 hours.  FINDING OUT THE RESULTS OF YOUR TEST Not all test results are available during your visit. If your test results are not back during the visit, make an appointment with your caregiver to find out the results. Do not assume everything is normal if you have not heard from your caregiver or the medical facility. It is important for you to follow up on all of your test results.  SEEK IMMEDIATE MEDICAL ATTENTION IF:  You have more than a spotting of blood in your stool.   Your belly is swollen (abdominal distention).   You are nauseated or vomiting.   You have a temperature over 101.  You have abdominal pain or discomfort that is severe or gets worse throughout the day.   EGD Discharge instructions Please read the instructions outlined below and refer to this sheet in the next few weeks. These discharge instructions provide you with general information on caring for yourself after you leave the hospital. Your doctor may also give you specific instructions. While your treatment has been planned according to the most current medical practices available, unavoidable complications occasionally occur. If you have any problems or questions after discharge, please call your doctor. ACTIVITY You may resume your regular activity but move at a slower pace for the next 24 hours.  Take frequent rest periods for the next 24 hours.  Walking will help expel (get rid of) the air and reduce the bloated feeling in your abdomen.  No driving for 24 hours (because of the anesthesia (medicine) used during the test).  You may shower.  Do not sign any important legal  documents or operate any machinery for 24 hours (because of the anesthesia used during the test).  NUTRITION Drink plenty of fluids.  You may resume your normal diet.  Begin with a light meal and progress to your normal diet.  Avoid alcoholic beverages for 24 hours or as instructed by your caregiver.  MEDICATIONS You may resume your normal medications unless your caregiver tells you otherwise.  WHAT YOU CAN EXPECT TODAY You may experience abdominal discomfort such as a feeling of fullness or "gas" pains.  FOLLOW-UP Your doctor will discuss the results of your test with you.  SEEK IMMEDIATE MEDICAL ATTENTION IF ANY OF THE FOLLOWING OCCUR: Excessive nausea (feeling sick to your stomach) and/or vomiting.  Severe abdominal pain and distention (swelling).  Trouble swallowing.  Temperature over 101 F (37.8 C).  Rectal bleeding or vomiting of blood.     GERD and polyp information  provided.  Further recommendations to follow pending review of pathology report.     Colon Polyps A polyp is extra tissue that grows inside your body. Colon polyps grow in the large intestine. The large intestine, also called the colon, is part of your digestive system. It is a long, hollow tube at the end of your digestive tract where your body makes and stores stool. Most polyps are not dangerous. They are benign. This means they are not cancerous. But over time, some types of polyps can turn into cancer. Polyps that are smaller than a pea are usually not harmful. But larger polyps could someday become or may already be cancerous. To be safe, doctors remove all polyps and test them.  WHO GETS POLYPS? Anyone can get polyps, but certain people are more likely than others. You may have a greater chance of getting polyps if: You are over 50.  You have had polyps before.  Someone in your family has had polyps.  Someone in your family has had cancer of the large intestine.  Find out if someone in your family has  had polyps. You may also be more likely to get polyps if you:  Eat a lot of fatty foods.  Smoke.  Drink alcohol.  Do not exercise.  Eat too much.  SYMPTOMS  Most small polyps do not cause symptoms. People often do not know they have one until their caregiver finds it during a regular checkup or while testing them for something else. Some people do have symptoms like these: Bleeding from the anus. You might notice blood on your underwear or on toilet paper after you have had a bowel movement.  Constipation or diarrhea that lasts more than a week.  Blood in the stool. Blood can make stool look black or it can show up as red streaks in the stool.  If you have any of these symptoms, see your caregiver. HOW DOES THE DOCTOR TEST FOR POLYPS? The doctor can use four tests to check for polyps: Digital rectal exam. The caregiver wears gloves and checks your rectum (the last part of the large intestine) to see if it feels normal. This test would find polyps only in the rectum. Your caregiver may need to do one of the other tests listed below to find polyps higher up in the intestine.  Barium enema. The caregiver puts a liquid called barium into your rectum before taking x-rays of your large intestine. Barium makes your intestine look white in the pictures. Polyps are dark, so they are easy to see.  Sigmoidoscopy. With this test, the caregiver can see inside your large intestine. A thin flexible tube is placed into your rectum. The device is called a sigmoidoscope, which has a light and a tiny video camera in it. The caregiver uses the sigmoidoscope to look at the last third of your large intestine.  Colonoscopy. This test is like sigmoidoscopy, but the caregiver looks at all of the large intestine. It usually requires sedation. This is the most common method for finding and removing polyps.  TREATMENT  The caregiver will remove the polyp during sigmoidoscopy or colonoscopy. The polyp is then tested for  cancer.  If you have had polyps, your caregiver may want you to get tested regularly in the future.  PREVENTION  There is not one sure way to prevent polyps. You might be able to lower your risk of getting them if you: Eat more fruits and vegetables and less fatty food.  Do not  smoke.  Avoid alcohol.  Exercise every day.  Lose weight if you are overweight.  Eating more calcium and folate can also lower your risk of getting polyps. Some foods that are rich in calcium are milk, cheese, and broccoli. Some foods that are rich in folate are chickpeas, kidney beans, and spinach.  Aspirin might help prevent polyps. Studies are under way.  Document Released: 06/25/2004 Document Revised: 09/18/2011 Document Reviewed: 12/01/2007 [facility] Patient Information 2012 [place], [place].   Gastroesophageal Reflux Disease, Adult Gastroesophageal reflux disease (GERD) happens when acid from your stomach flows up into the esophagus. When acid comes in contact with the esophagus, the acid causes soreness (inflammation) in the esophagus. Over time, GERD may create small holes (ulcers) in the lining of the esophagus. CAUSES  Increased body weight. This puts pressure on the stomach, making acid rise from the stomach into the esophagus.  Smoking. This increases acid production in the stomach.  Drinking alcohol. This causes decreased pressure in the lower esophageal sphincter (valve or ring of muscle between the esophagus and stomach), allowing acid from the stomach into the esophagus.  Late evening meals and a full stomach. This increases pressure and acid production in the stomach.  A malformed lower esophageal sphincter.  Sometimes, no cause is found. SYMPTOMS  Burning pain in the lower part of the mid-chest behind the breastbone and in the mid-stomach area. This may occur twice a week or more often.  Trouble swallowing.  Sore throat.  Dry cough.  Asthma-like symptoms including chest tightness, shortness of  breath, or wheezing.  DIAGNOSIS  Your caregiver may be able to diagnose GERD based on your symptoms. In some cases, X-rays and other tests may be done to check for complications or to check the condition of your stomach and esophagus. TREATMENT  Your caregiver may recommend over-the-counter or prescription medicines to help decrease acid production. Ask your caregiver before starting or adding any new medicines.  HOME CARE INSTRUCTIONS  Change the factors that you can control. Ask your caregiver for guidance concerning weight loss, quitting smoking, and alcohol consumption.  Avoid foods and drinks that make your symptoms worse, such as:  Caffeine or alcoholic drinks.  Chocolate.  Peppermint or mint flavorings.  Garlic and onions.  Spicy foods.  Citrus fruits, such as oranges, lemons, or limes.  Tomato-based foods such as sauce, chili, salsa, and pizza.  Fried and fatty foods.  Avoid lying down for the 3 hours prior to your bedtime or prior to taking a nap.  Eat small, frequent meals instead of large meals.  Wear loose-fitting clothing. Do not wear anything tight around your waist that causes pressure on your stomach.  Raise the head of your bed 6 to 8 inches with wood blocks to help you sleep. Extra pillows will not help.  Only take over-the-counter or prescription medicines for pain, discomfort, or fever as directed by your caregiver.  Do not take aspirin, ibuprofen, or other nonsteroidal anti-inflammatory drugs (NSAIDs).  SEEK IMMEDIATE MEDICAL CARE IF:  You have pain in your arms, neck, jaw, teeth, or back.  Your pain increases or changes in intensity or duration.  You develop nausea, vomiting, or sweating (diaphoresis).  You develop shortness of breath, or you faint.  Your vomit is green, yellow, black, or looks like coffee grounds or blood.  Your stool is red, bloody, or black.  These symptoms could be signs of other problems, such as heart disease, gastric bleeding, or esophageal  bleeding. MAKE SURE YOU:  Understand  these instructions.  Will watch your condition.  Will get help right away if you are not doing well or get worse.  Document Released: 07/09/2005 Document Revised: 09/18/2011 Document Reviewed: 04/18/2011 [facility] Patient Information 2012 [place], [place].

## 2012-04-08 NOTE — Interval H&P Note (Signed)
History and Physical Interval Note:  04/08/2012 7:45 AM  [name]  has presented today for surgery, with the diagnosis of SCREENING COLONOSCOPY, CHRONIC GERD, ABD PAIN  The various methods of treatment have been discussed with the patient and family. After consideration of risks, benefits and other options for treatment, the patient has consented to  Procedure(s) (LRB): COLONOSCOPY WITH ESOPHAGOGASTRODUODENOSCOPY (EGD) (N/A) as a surgical intervention .  The patient's history has been reviewed, patient examined, no change in status, stable for surgery.  I have reviewed the patients' chart and labs.  Questions were answered to the patient's satisfaction.     [name]

## 2012-04-09 NOTE — Op Note (Signed)
[facility] [address] [place], [place]  [address]  COLONOSCOPY PROCEDURE REPORT  PATIENT:  Damon, Pena  MR#:  [id number] BIRTHDATE:  03-11-1941, 70 yrs. old  GENDER:  male ENDOSCOPIST:  R. Roetta Sessions, MD FACP [facility] REF. BY:  Catalina Pizza, M.D. PROCEDURE DATE:  04/08/2012 PROCEDURE:  ileocolonoscopy with biopsy  INDICATIONS:  Average risk colorectal cancer screening.  INFORMED CONSENT:  The risks, benefits, alternatives and imponderables including but not limited to bleeding, perforation as well as the possibility of a missed lesion have been reviewed. The potential for biopsy, lesion removal, etc. have also been discussed.  Questions have been answered.  All parties agreeable. Please see the history and physical in the medical record for more information.  MEDICATIONS:  Versed 6 mg IV and Demerol 100 mg IV in divided doses.  DESCRIPTION OF PROCEDURE:  After a digital rectal exam was performed, the EC-[id number] ([id number]) colonoscope was advanced from the anus through the rectum and colon to the area of the cecum, ileocecal valve and appendiceal orifice.  The cecum was deeply intubated.  These structures were well-seen and photographed for the record.  From the level of the cecum and ileocecal valve, the scope was slowly and cautiously withdrawn.  The mucosal surfaces were carefully surveyed utilizing scope tip deflection to facilitate fold flattening as needed.  The scope was pulled down into the rectum where a thorough examination including retroflexion was performed. <<PROCEDUREIMAGES>>  FINDINGS: Adequate preparation. Single diminutive polyp 5 cm in from the anal verge in the rectum; otherwise normal rectum. A single diminutive       polyp in the mid descending segment; otherwise, normal colonic mucosa aside from a scattered prominent vascular pattern. Distal 10 cm        of terminal ileal mucosa appeared normal.  THERAPEUTIC / DIAGNOSTIC MANEUVERS PERFORMED:   The chief above-mentioned polyps were cold biopsied/removed.  COMPLICATIONS:  None  CECAL WITHDRAWAL TIME: 12 minutes  IMPRESSION: Rectal and colonic polyps-removed as described above  RECOMMENDATIONS:  Followup on pathology. See EGD report  ______________________________ R. Roetta Sessions, MD Caleen Essex  CC:  Catalina Pizza, M.D.  n. eSIGNED:   R. Casimiro Needle Caniyah Murley at 04/08/2012 08:31 AM  Bunnie Philips, [phone]

## 2012-04-13 ENCOUNTER — Encounter: Payer: Self-pay | Admitting: Internal Medicine

## 2013-05-02 ENCOUNTER — Other Ambulatory Visit (HOSPITAL_COMMUNITY): Payer: Self-pay | Admitting: Urology

## 2013-05-02 ENCOUNTER — Ambulatory Visit (HOSPITAL_COMMUNITY)
Admission: RE | Admit: 2013-05-02 | Discharge: 2013-05-02 | Disposition: A | Discharge status: Home / Self Care | Payer: Medicare Other | Source: Ambulatory Visit | Attending: Urology | Admitting: Urology

## 2013-05-02 DIAGNOSIS — Z87442 Personal history of urinary calculi: Secondary | ICD-10-CM | POA: Insufficient documentation

## 2013-05-02 DIAGNOSIS — R310 Gross hematuria: Secondary | ICD-10-CM

## 2013-05-03 ENCOUNTER — Other Ambulatory Visit (HOSPITAL_COMMUNITY): Payer: Self-pay | Admitting: Urology

## 2013-05-03 ENCOUNTER — Other Ambulatory Visit: Payer: Self-pay | Admitting: Urology

## 2013-05-03 DIAGNOSIS — R310 Gross hematuria: Secondary | ICD-10-CM

## 2013-05-03 DIAGNOSIS — R972 Elevated prostate specific antigen [PSA]: Secondary | ICD-10-CM

## 2013-05-06 ENCOUNTER — Ambulatory Visit (HOSPITAL_COMMUNITY)
Admission: RE | Admit: 2013-05-06 | Discharge: 2013-05-06 | Disposition: A | Discharge status: Home / Self Care | Payer: Medicare Other | Source: Ambulatory Visit | Attending: Urology | Admitting: Urology

## 2013-05-06 DIAGNOSIS — N209 Urinary calculus, unspecified: Secondary | ICD-10-CM | POA: Insufficient documentation

## 2013-05-06 DIAGNOSIS — R310 Gross hematuria: Secondary | ICD-10-CM

## 2013-05-06 DIAGNOSIS — R972 Elevated prostate specific antigen [PSA]: Secondary | ICD-10-CM

## 2013-05-06 MED ORDER — IOHEXOL 300 MG/ML  SOLN
100.0000 mL | Freq: Once | INTRAMUSCULAR | Status: AC | PRN
  Administered 2013-05-06: 100 mL via INTRAVENOUS

## 2014-10-23 DIAGNOSIS — R7301 Impaired fasting glucose: Secondary | ICD-10-CM | POA: Diagnosis not present

## 2014-10-23 DIAGNOSIS — R972 Elevated prostate specific antigen [PSA]: Secondary | ICD-10-CM | POA: Diagnosis not present

## 2014-10-23 DIAGNOSIS — E785 Hyperlipidemia, unspecified: Secondary | ICD-10-CM | POA: Diagnosis not present

## 2014-10-25 DIAGNOSIS — N401 Enlarged prostate with lower urinary tract symptoms: Secondary | ICD-10-CM | POA: Diagnosis not present

## 2014-10-25 DIAGNOSIS — R944 Abnormal results of kidney function studies: Secondary | ICD-10-CM | POA: Diagnosis not present

## 2014-10-25 DIAGNOSIS — E782 Mixed hyperlipidemia: Secondary | ICD-10-CM | POA: Diagnosis not present

## 2014-11-06 DIAGNOSIS — H43819 Vitreous degeneration, unspecified eye: Secondary | ICD-10-CM | POA: Diagnosis not present

## 2014-11-06 DIAGNOSIS — H524 Presbyopia: Secondary | ICD-10-CM | POA: Diagnosis not present

## 2014-11-06 DIAGNOSIS — H52221 Regular astigmatism, right eye: Secondary | ICD-10-CM | POA: Diagnosis not present

## 2014-11-06 DIAGNOSIS — H5203 Hypermetropia, bilateral: Secondary | ICD-10-CM | POA: Diagnosis not present

## 2014-12-09 DIAGNOSIS — Z6826 Body mass index (BMI) 26.0-26.9, adult: Secondary | ICD-10-CM | POA: Diagnosis not present

## 2014-12-09 DIAGNOSIS — R944 Abnormal results of kidney function studies: Secondary | ICD-10-CM | POA: Diagnosis not present

## 2014-12-09 DIAGNOSIS — N401 Enlarged prostate with lower urinary tract symptoms: Secondary | ICD-10-CM | POA: Diagnosis not present

## 2014-12-09 DIAGNOSIS — E782 Mixed hyperlipidemia: Secondary | ICD-10-CM | POA: Diagnosis not present

## 2015-04-02 DIAGNOSIS — R05 Cough: Secondary | ICD-10-CM | POA: Diagnosis not present

## 2015-04-02 DIAGNOSIS — J040 Acute laryngitis: Secondary | ICD-10-CM | POA: Diagnosis not present

## 2015-04-23 DIAGNOSIS — R7301 Impaired fasting glucose: Secondary | ICD-10-CM | POA: Diagnosis not present

## 2015-04-23 DIAGNOSIS — E782 Mixed hyperlipidemia: Secondary | ICD-10-CM | POA: Diagnosis not present

## 2015-04-23 DIAGNOSIS — N401 Enlarged prostate with lower urinary tract symptoms: Secondary | ICD-10-CM | POA: Diagnosis not present

## 2015-04-26 DIAGNOSIS — R972 Elevated prostate specific antigen [PSA]: Secondary | ICD-10-CM | POA: Diagnosis not present

## 2015-04-26 DIAGNOSIS — E785 Hyperlipidemia, unspecified: Secondary | ICD-10-CM | POA: Diagnosis not present

## 2015-04-26 DIAGNOSIS — R7301 Impaired fasting glucose: Secondary | ICD-10-CM | POA: Diagnosis not present

## 2015-04-26 DIAGNOSIS — R944 Abnormal results of kidney function studies: Secondary | ICD-10-CM | POA: Diagnosis not present

## 2015-06-04 DIAGNOSIS — R222 Localized swelling, mass and lump, trunk: Secondary | ICD-10-CM | POA: Diagnosis not present

## 2015-08-27 DIAGNOSIS — S71112A Laceration without foreign body, left thigh, initial encounter: Secondary | ICD-10-CM | POA: Diagnosis not present

## 2015-09-05 DIAGNOSIS — M542 Cervicalgia: Secondary | ICD-10-CM | POA: Diagnosis not present

## 2015-11-06 DIAGNOSIS — E785 Hyperlipidemia, unspecified: Secondary | ICD-10-CM | POA: Diagnosis not present

## 2015-11-06 DIAGNOSIS — R7301 Impaired fasting glucose: Secondary | ICD-10-CM | POA: Diagnosis not present

## 2015-11-06 DIAGNOSIS — R972 Elevated prostate specific antigen [PSA]: Secondary | ICD-10-CM | POA: Diagnosis not present

## 2015-11-08 DIAGNOSIS — N400 Enlarged prostate without lower urinary tract symptoms: Secondary | ICD-10-CM | POA: Diagnosis not present

## 2015-11-08 DIAGNOSIS — R972 Elevated prostate specific antigen [PSA]: Secondary | ICD-10-CM | POA: Diagnosis not present

## 2015-11-08 DIAGNOSIS — E875 Hyperkalemia: Secondary | ICD-10-CM | POA: Diagnosis not present

## 2015-11-08 DIAGNOSIS — Z23 Encounter for immunization: Secondary | ICD-10-CM | POA: Diagnosis not present

## 2015-11-08 DIAGNOSIS — R944 Abnormal results of kidney function studies: Secondary | ICD-10-CM | POA: Diagnosis not present

## 2016-01-30 DIAGNOSIS — M62830 Muscle spasm of back: Secondary | ICD-10-CM | POA: Diagnosis not present

## 2016-01-30 DIAGNOSIS — M545 Low back pain: Secondary | ICD-10-CM | POA: Diagnosis not present

## 2016-02-13 DIAGNOSIS — H903 Sensorineural hearing loss, bilateral: Secondary | ICD-10-CM | POA: Diagnosis not present

## 2016-05-07 DIAGNOSIS — E291 Testicular hypofunction: Secondary | ICD-10-CM | POA: Diagnosis not present

## 2016-05-07 DIAGNOSIS — E782 Mixed hyperlipidemia: Secondary | ICD-10-CM | POA: Diagnosis not present

## 2016-05-07 DIAGNOSIS — R7301 Impaired fasting glucose: Secondary | ICD-10-CM | POA: Diagnosis not present

## 2016-05-13 DIAGNOSIS — E782 Mixed hyperlipidemia: Secondary | ICD-10-CM | POA: Diagnosis not present

## 2016-05-13 DIAGNOSIS — R944 Abnormal results of kidney function studies: Secondary | ICD-10-CM | POA: Diagnosis not present

## 2016-05-13 DIAGNOSIS — R7301 Impaired fasting glucose: Secondary | ICD-10-CM | POA: Diagnosis not present

## 2016-05-13 DIAGNOSIS — N400 Enlarged prostate without lower urinary tract symptoms: Secondary | ICD-10-CM | POA: Diagnosis not present

## 2016-05-13 DIAGNOSIS — K219 Gastro-esophageal reflux disease without esophagitis: Secondary | ICD-10-CM | POA: Diagnosis not present

## 2016-05-28 DIAGNOSIS — R944 Abnormal results of kidney function studies: Secondary | ICD-10-CM | POA: Diagnosis not present

## 2016-06-11 DIAGNOSIS — E875 Hyperkalemia: Secondary | ICD-10-CM | POA: Diagnosis not present

## 2016-06-11 DIAGNOSIS — R944 Abnormal results of kidney function studies: Secondary | ICD-10-CM | POA: Diagnosis not present

## 2016-08-18 DIAGNOSIS — K219 Gastro-esophageal reflux disease without esophagitis: Secondary | ICD-10-CM | POA: Diagnosis not present

## 2016-08-18 DIAGNOSIS — R05 Cough: Secondary | ICD-10-CM | POA: Diagnosis not present

## 2016-08-18 DIAGNOSIS — R319 Hematuria, unspecified: Secondary | ICD-10-CM | POA: Diagnosis not present

## 2016-08-18 DIAGNOSIS — Z87442 Personal history of urinary calculi: Secondary | ICD-10-CM | POA: Diagnosis not present

## 2016-08-27 DIAGNOSIS — H52221 Regular astigmatism, right eye: Secondary | ICD-10-CM | POA: Diagnosis not present

## 2016-08-27 DIAGNOSIS — H524 Presbyopia: Secondary | ICD-10-CM | POA: Diagnosis not present

## 2016-08-27 DIAGNOSIS — H11153 Pinguecula, bilateral: Secondary | ICD-10-CM | POA: Diagnosis not present

## 2016-08-27 DIAGNOSIS — H5203 Hypermetropia, bilateral: Secondary | ICD-10-CM | POA: Diagnosis not present

## 2016-09-30 DIAGNOSIS — Z0000 Encounter for general adult medical examination without abnormal findings: Secondary | ICD-10-CM | POA: Diagnosis not present

## 2016-10-16 DIAGNOSIS — R7301 Impaired fasting glucose: Secondary | ICD-10-CM | POA: Diagnosis not present

## 2016-10-16 DIAGNOSIS — E291 Testicular hypofunction: Secondary | ICD-10-CM | POA: Diagnosis not present

## 2016-10-16 DIAGNOSIS — E782 Mixed hyperlipidemia: Secondary | ICD-10-CM | POA: Diagnosis not present

## 2016-10-16 DIAGNOSIS — R972 Elevated prostate specific antigen [PSA]: Secondary | ICD-10-CM | POA: Diagnosis not present

## 2016-10-20 DIAGNOSIS — N400 Enlarged prostate without lower urinary tract symptoms: Secondary | ICD-10-CM | POA: Diagnosis not present

## 2016-10-20 DIAGNOSIS — R972 Elevated prostate specific antigen [PSA]: Secondary | ICD-10-CM | POA: Diagnosis not present

## 2016-10-20 DIAGNOSIS — E875 Hyperkalemia: Secondary | ICD-10-CM | POA: Diagnosis not present

## 2016-10-20 DIAGNOSIS — E782 Mixed hyperlipidemia: Secondary | ICD-10-CM | POA: Diagnosis not present

## 2016-10-20 DIAGNOSIS — R944 Abnormal results of kidney function studies: Secondary | ICD-10-CM | POA: Diagnosis not present

## 2017-05-05 DIAGNOSIS — R7301 Impaired fasting glucose: Secondary | ICD-10-CM | POA: Diagnosis not present

## 2017-05-05 DIAGNOSIS — E291 Testicular hypofunction: Secondary | ICD-10-CM | POA: Diagnosis not present

## 2017-05-05 DIAGNOSIS — E782 Mixed hyperlipidemia: Secondary | ICD-10-CM | POA: Diagnosis not present

## 2017-05-05 DIAGNOSIS — R972 Elevated prostate specific antigen [PSA]: Secondary | ICD-10-CM | POA: Diagnosis not present

## 2017-05-07 DIAGNOSIS — R944 Abnormal results of kidney function studies: Secondary | ICD-10-CM | POA: Diagnosis not present

## 2017-05-07 DIAGNOSIS — N400 Enlarged prostate without lower urinary tract symptoms: Secondary | ICD-10-CM | POA: Diagnosis not present

## 2017-05-07 DIAGNOSIS — E875 Hyperkalemia: Secondary | ICD-10-CM | POA: Diagnosis not present

## 2017-05-07 DIAGNOSIS — R300 Dysuria: Secondary | ICD-10-CM | POA: Diagnosis not present

## 2017-05-07 DIAGNOSIS — E782 Mixed hyperlipidemia: Secondary | ICD-10-CM | POA: Diagnosis not present

## 2017-08-06 DIAGNOSIS — R350 Frequency of micturition: Secondary | ICD-10-CM | POA: Diagnosis not present

## 2017-08-06 DIAGNOSIS — R319 Hematuria, unspecified: Secondary | ICD-10-CM | POA: Diagnosis not present

## 2017-11-05 DIAGNOSIS — E291 Testicular hypofunction: Secondary | ICD-10-CM | POA: Diagnosis not present

## 2017-11-05 DIAGNOSIS — R7301 Impaired fasting glucose: Secondary | ICD-10-CM | POA: Diagnosis not present

## 2017-11-05 DIAGNOSIS — E782 Mixed hyperlipidemia: Secondary | ICD-10-CM | POA: Diagnosis not present

## 2017-11-05 DIAGNOSIS — R972 Elevated prostate specific antigen [PSA]: Secondary | ICD-10-CM | POA: Diagnosis not present

## 2017-11-05 DIAGNOSIS — N400 Enlarged prostate without lower urinary tract symptoms: Secondary | ICD-10-CM | POA: Diagnosis not present

## 2017-11-09 DIAGNOSIS — E782 Mixed hyperlipidemia: Secondary | ICD-10-CM | POA: Diagnosis not present

## 2017-11-09 DIAGNOSIS — E875 Hyperkalemia: Secondary | ICD-10-CM | POA: Diagnosis not present

## 2017-11-09 DIAGNOSIS — E291 Testicular hypofunction: Secondary | ICD-10-CM | POA: Diagnosis not present

## 2017-11-09 DIAGNOSIS — R944 Abnormal results of kidney function studies: Secondary | ICD-10-CM | POA: Diagnosis not present

## 2017-11-09 DIAGNOSIS — K219 Gastro-esophageal reflux disease without esophagitis: Secondary | ICD-10-CM | POA: Diagnosis not present

## 2017-11-21 DIAGNOSIS — R319 Hematuria, unspecified: Secondary | ICD-10-CM | POA: Diagnosis not present

## 2017-11-21 DIAGNOSIS — S76091D Other specified injury of muscle, fascia and tendon of right hip, subsequent encounter: Secondary | ICD-10-CM | POA: Diagnosis not present

## 2017-11-24 ENCOUNTER — Other Ambulatory Visit (HOSPITAL_COMMUNITY): Payer: Self-pay | Admitting: Internal Medicine

## 2017-11-24 DIAGNOSIS — R319 Hematuria, unspecified: Secondary | ICD-10-CM

## 2017-11-24 DIAGNOSIS — R1031 Right lower quadrant pain: Secondary | ICD-10-CM

## 2017-11-27 ENCOUNTER — Ambulatory Visit (HOSPITAL_COMMUNITY)
Admission: RE | Admit: 2017-11-27 | Discharge: 2017-11-27 | Disposition: A | Discharge status: Home / Self Care | Payer: Medicare Other | Source: Ambulatory Visit | Attending: Internal Medicine | Admitting: Internal Medicine

## 2017-11-27 DIAGNOSIS — R319 Hematuria, unspecified: Secondary | ICD-10-CM | POA: Insufficient documentation

## 2017-11-27 DIAGNOSIS — I700 Atherosclerosis of aorta: Secondary | ICD-10-CM | POA: Diagnosis not present

## 2017-11-27 DIAGNOSIS — N400 Enlarged prostate without lower urinary tract symptoms: Secondary | ICD-10-CM | POA: Diagnosis not present

## 2017-11-27 DIAGNOSIS — N3289 Other specified disorders of bladder: Secondary | ICD-10-CM | POA: Diagnosis not present

## 2017-11-27 DIAGNOSIS — R1031 Right lower quadrant pain: Secondary | ICD-10-CM | POA: Diagnosis not present

## 2017-11-27 DIAGNOSIS — R109 Unspecified abdominal pain: Secondary | ICD-10-CM | POA: Diagnosis not present

## 2017-12-07 DIAGNOSIS — R972 Elevated prostate specific antigen [PSA]: Secondary | ICD-10-CM | POA: Diagnosis not present

## 2017-12-07 DIAGNOSIS — R310 Gross hematuria: Secondary | ICD-10-CM | POA: Diagnosis not present

## 2017-12-07 DIAGNOSIS — R3915 Urgency of urination: Secondary | ICD-10-CM | POA: Diagnosis not present

## 2017-12-07 DIAGNOSIS — C670 Malignant neoplasm of trigone of bladder: Secondary | ICD-10-CM | POA: Diagnosis not present

## 2017-12-07 DIAGNOSIS — N183 Chronic kidney disease, stage 3 (moderate): Secondary | ICD-10-CM | POA: Diagnosis not present

## 2017-12-08 ENCOUNTER — Encounter (HOSPITAL_BASED_OUTPATIENT_CLINIC_OR_DEPARTMENT_OTHER): Payer: Self-pay | Admitting: *Deleted

## 2017-12-08 ENCOUNTER — Other Ambulatory Visit: Payer: Self-pay

## 2017-12-08 ENCOUNTER — Other Ambulatory Visit: Payer: Self-pay | Admitting: Urology

## 2017-12-08 DIAGNOSIS — H11153 Pinguecula, bilateral: Secondary | ICD-10-CM | POA: Diagnosis not present

## 2017-12-08 DIAGNOSIS — H52221 Regular astigmatism, right eye: Secondary | ICD-10-CM | POA: Diagnosis not present

## 2017-12-08 DIAGNOSIS — H5203 Hypermetropia, bilateral: Secondary | ICD-10-CM | POA: Diagnosis not present

## 2017-12-08 DIAGNOSIS — H524 Presbyopia: Secondary | ICD-10-CM | POA: Diagnosis not present

## 2017-12-08 NOTE — Progress Notes (Addendum)
SPOKE W/ PT VIA PHONE FOR PRE-OP INTERVIEW.  NPO AFTER MN.  ARRIVE AT 0530.  NEEDS HG.  WILL TAKE AM MEDS W/ SIPS OF WATER DOS.  PT TO CALL BACK WITH OTHER MEDS TO UPDATE LIST IN Epic.     ADDENDUM:  PT CALLED BACK VIA PHONE W/ MEDICATIONS AND WAS UPDATED IN Epic.

## 2017-12-11 ENCOUNTER — Ambulatory Visit (HOSPITAL_BASED_OUTPATIENT_CLINIC_OR_DEPARTMENT_OTHER): Payer: Medicare Other | Admitting: Certified Registered Nurse Anesthetist

## 2017-12-11 ENCOUNTER — Ambulatory Visit (HOSPITAL_BASED_OUTPATIENT_CLINIC_OR_DEPARTMENT_OTHER)
Admission: RE | Admit: 2017-12-11 | Discharge: 2017-12-11 | Disposition: A | Discharge status: Home / Self Care | Payer: Medicare Other | Source: Ambulatory Visit | Attending: Urology | Admitting: Urology

## 2017-12-11 ENCOUNTER — Encounter (HOSPITAL_BASED_OUTPATIENT_CLINIC_OR_DEPARTMENT_OTHER)
Admission: RE | Disposition: A | Discharge status: Home / Self Care | Payer: Self-pay | Source: Ambulatory Visit | Attending: Urology

## 2017-12-11 ENCOUNTER — Encounter (HOSPITAL_BASED_OUTPATIENT_CLINIC_OR_DEPARTMENT_OTHER): Payer: Self-pay

## 2017-12-11 DIAGNOSIS — Z79899 Other long term (current) drug therapy: Secondary | ICD-10-CM | POA: Insufficient documentation

## 2017-12-11 DIAGNOSIS — C679 Malignant neoplasm of bladder, unspecified: Secondary | ICD-10-CM | POA: Diagnosis not present

## 2017-12-11 DIAGNOSIS — Z87442 Personal history of urinary calculi: Secondary | ICD-10-CM | POA: Diagnosis not present

## 2017-12-11 DIAGNOSIS — C673 Malignant neoplasm of anterior wall of bladder: Secondary | ICD-10-CM | POA: Insufficient documentation

## 2017-12-11 DIAGNOSIS — N400 Enlarged prostate without lower urinary tract symptoms: Secondary | ICD-10-CM | POA: Insufficient documentation

## 2017-12-11 DIAGNOSIS — R310 Gross hematuria: Secondary | ICD-10-CM | POA: Diagnosis not present

## 2017-12-11 DIAGNOSIS — K449 Diaphragmatic hernia without obstruction or gangrene: Secondary | ICD-10-CM | POA: Diagnosis not present

## 2017-12-11 DIAGNOSIS — C678 Malignant neoplasm of overlapping sites of bladder: Secondary | ICD-10-CM | POA: Diagnosis not present

## 2017-12-11 DIAGNOSIS — N3289 Other specified disorders of bladder: Secondary | ICD-10-CM | POA: Insufficient documentation

## 2017-12-11 DIAGNOSIS — K219 Gastro-esophageal reflux disease without esophagitis: Secondary | ICD-10-CM | POA: Insufficient documentation

## 2017-12-11 DIAGNOSIS — E7800 Pure hypercholesterolemia, unspecified: Secondary | ICD-10-CM | POA: Insufficient documentation

## 2017-12-11 DIAGNOSIS — R1031 Right lower quadrant pain: Secondary | ICD-10-CM | POA: Diagnosis not present

## 2017-12-11 DIAGNOSIS — Z87891 Personal history of nicotine dependence: Secondary | ICD-10-CM | POA: Diagnosis not present

## 2017-12-11 HISTORY — DX: Presence of spectacles and contact lenses: Z97.3

## 2017-12-11 HISTORY — DX: Personal history of other diseases of urinary system: Z87.448

## 2017-12-11 HISTORY — DX: Malignant neoplasm of bladder, unspecified: C67.9

## 2017-12-11 HISTORY — DX: Complete loss of teeth, unspecified cause, unspecified class: K08.109

## 2017-12-11 HISTORY — DX: Diaphragmatic hernia without obstruction or gangrene: K44.9

## 2017-12-11 HISTORY — PX: TRANSURETHRAL RESECTION OF BLADDER TUMOR: SHX2575

## 2017-12-11 HISTORY — DX: Personal history of urinary calculi: Z87.442

## 2017-12-11 HISTORY — DX: Benign prostatic hyperplasia without lower urinary tract symptoms: N40.0

## 2017-12-11 HISTORY — DX: Complete loss of teeth, unspecified cause, unspecified class: Z97.2

## 2017-12-11 HISTORY — DX: Presence of external hearing-aid: Z97.4

## 2017-12-11 LAB — POCT HEMOGLOBIN-HEMACUE: Hemoglobin: 14.6 g/dL (ref 13.0–17.0)

## 2017-12-11 SURGERY — TURBT (TRANSURETHRAL RESECTION OF BLADDER TUMOR)
Anesthesia: General | Site: Bladder

## 2017-12-11 MED ORDER — STERILE WATER FOR IRRIGATION IR SOLN
Status: DC | PRN
  Administered 2017-12-11: 500 mL

## 2017-12-11 MED ORDER — CEFAZOLIN SODIUM-DEXTROSE 2-4 GM/100ML-% IV SOLN
INTRAVENOUS | Status: AC
  Filled 2017-12-11: qty 100

## 2017-12-11 MED ORDER — FENTANYL CITRATE (PF) 100 MCG/2ML IJ SOLN
INTRAMUSCULAR | Status: AC
  Filled 2017-12-11: qty 2

## 2017-12-11 MED ORDER — TRAMADOL HCL 50 MG PO TABS
50.0000 mg | ORAL_TABLET | Freq: Four times a day (QID) | ORAL | Status: DC | PRN
  Administered 2017-12-11: 50 mg via ORAL
  Filled 2017-12-11: qty 1

## 2017-12-11 MED ORDER — ONDANSETRON HCL 4 MG/2ML IJ SOLN
INTRAMUSCULAR | Status: AC
  Filled 2017-12-11: qty 2

## 2017-12-11 MED ORDER — DEXAMETHASONE SODIUM PHOSPHATE 10 MG/ML IJ SOLN
INTRAMUSCULAR | Status: AC
  Filled 2017-12-11: qty 1

## 2017-12-11 MED ORDER — TRAMADOL HCL 50 MG PO TABS: 50.0000 mg | ORAL_TABLET | Freq: Four times a day (QID) | ORAL | 0 refills | Status: DC | PRN

## 2017-12-11 MED ORDER — CEPHALEXIN 500 MG PO CAPS: 500.0000 mg | ORAL_CAPSULE | Freq: Two times a day (BID) | ORAL | 0 refills | Status: DC

## 2017-12-11 MED ORDER — SODIUM CHLORIDE 0.9 % IR SOLN
Status: DC | PRN
Start: 2017-12-11 — End: 2017-12-11
  Administered 2017-12-11 (×5): 3000 mL

## 2017-12-11 MED ORDER — FENTANYL CITRATE (PF) 100 MCG/2ML IJ SOLN
INTRAMUSCULAR | Status: DC | PRN
  Administered 2017-12-11: 50 ug via INTRAVENOUS
  Administered 2017-12-11 (×2): 25 ug via INTRAVENOUS

## 2017-12-11 MED ORDER — LACTATED RINGERS IV SOLN
INTRAVENOUS | Status: DC
  Administered 2017-12-11 (×2): via INTRAVENOUS
  Filled 2017-12-11: qty 1000

## 2017-12-11 MED ORDER — METOCLOPRAMIDE HCL 5 MG/ML IJ SOLN
10.0000 mg | Freq: Once | INTRAMUSCULAR | Status: DC | PRN
  Filled 2017-12-11: qty 2

## 2017-12-11 MED ORDER — MEPERIDINE HCL 25 MG/ML IJ SOLN
6.2500 mg | INTRAMUSCULAR | Status: DC | PRN
  Filled 2017-12-11: qty 1

## 2017-12-11 MED ORDER — IOHEXOL 300 MG/ML  SOLN
INTRAMUSCULAR | Status: DC | PRN
  Administered 2017-12-11: 30 mL

## 2017-12-11 MED ORDER — LIDOCAINE 2% (20 MG/ML) 5 ML SYRINGE
INTRAMUSCULAR | Status: DC | PRN
  Administered 2017-12-11: 80 mg via INTRAVENOUS

## 2017-12-11 MED ORDER — PHENYLEPHRINE 40 MCG/ML (10ML) SYRINGE FOR IV PUSH (FOR BLOOD PRESSURE SUPPORT)
PREFILLED_SYRINGE | INTRAVENOUS | Status: AC
  Filled 2017-12-11: qty 10

## 2017-12-11 MED ORDER — LIDOCAINE 2% (20 MG/ML) 5 ML SYRINGE
INTRAMUSCULAR | Status: AC
  Filled 2017-12-11: qty 5

## 2017-12-11 MED ORDER — DEXAMETHASONE SODIUM PHOSPHATE 10 MG/ML IJ SOLN
INTRAMUSCULAR | Status: DC | PRN
  Administered 2017-12-11: 10 mg via INTRAVENOUS

## 2017-12-11 MED ORDER — PROPOFOL 10 MG/ML IV BOLUS
INTRAVENOUS | Status: AC
Start: 2017-12-11 — End: 2017-12-11
  Filled 2017-12-11: qty 40

## 2017-12-11 MED ORDER — CEFAZOLIN SODIUM-DEXTROSE 2-4 GM/100ML-% IV SOLN
2.0000 g | INTRAVENOUS | Status: AC
  Administered 2017-12-11: 2 g via INTRAVENOUS
  Filled 2017-12-11: qty 100

## 2017-12-11 MED ORDER — SENNOSIDES-DOCUSATE SODIUM 8.6-50 MG PO TABS: 1.0000 | ORAL_TABLET | Freq: Two times a day (BID) | ORAL | 0 refills | Status: DC

## 2017-12-11 MED ORDER — TRAMADOL HCL 50 MG PO TABS
ORAL_TABLET | ORAL | Status: AC
  Filled 2017-12-11: qty 1

## 2017-12-11 MED ORDER — PROPOFOL 10 MG/ML IV BOLUS
INTRAVENOUS | Status: DC | PRN
  Administered 2017-12-11: 50 mg via INTRAVENOUS
  Administered 2017-12-11: 150 mg via INTRAVENOUS

## 2017-12-11 MED ORDER — ONDANSETRON HCL 4 MG/2ML IJ SOLN
INTRAMUSCULAR | Status: DC | PRN
  Administered 2017-12-11: 4 mg via INTRAVENOUS

## 2017-12-11 MED ORDER — FENTANYL CITRATE (PF) 100 MCG/2ML IJ SOLN
25.0000 ug | INTRAMUSCULAR | Status: DC | PRN
  Administered 2017-12-11 (×2): 50 ug via INTRAVENOUS
  Filled 2017-12-11: qty 1

## 2017-12-11 SURGICAL SUPPLY — 27 items
BAG DRAIN URO-CYSTO SKYTR STRL (DRAIN) ×3 IMPLANT
BAG DRN ANRFLXCHMBR STRAP LEK (BAG)
BAG DRN UROCATH (DRAIN) ×1
BAG URINE DRAINAGE (UROLOGICAL SUPPLIES) ×2 IMPLANT
BAG URINE LEG 19OZ MD ST LTX (BAG) IMPLANT
BAG URINE LEG 500ML (DRAIN) IMPLANT
CATH FOLEY 2WAY SLVR  5CC 22FR (CATHETERS)
CATH FOLEY 2WAY SLVR 30CC 20FR (CATHETERS) IMPLANT
CATH FOLEY 2WAY SLVR 5CC 22FR (CATHETERS) IMPLANT
CATH HEMA 3WAY 30CC 24FR COUDE (CATHETERS) ×2 IMPLANT
CATH INTERMIT  6FR 70CM (CATHETERS) ×2 IMPLANT
CLOTH BEACON ORANGE TIMEOUT ST (SAFETY) ×3 IMPLANT
EVACUATOR MICROVAS BLADDER (UROLOGICAL SUPPLIES) IMPLANT
GLOVE BIO SURGEON STRL SZ7.5 (GLOVE) ×7 IMPLANT
GOWN STRL REUS W/ TWL LRG LVL3 (GOWN DISPOSABLE) ×1 IMPLANT
GOWN STRL REUS W/TWL LRG LVL3 (GOWN DISPOSABLE) ×9
GUIDEWIRE STR DUAL SENSOR (WIRE) ×2 IMPLANT
IV NS IRRIG 3000ML ARTHROMATIC (IV SOLUTION) ×10 IMPLANT
KIT TURNOVER CYSTO (KITS) ×3 IMPLANT
MANIFOLD NEPTUNE II (INSTRUMENTS) ×2 IMPLANT
PACK CYSTO (CUSTOM PROCEDURE TRAY) ×3 IMPLANT
PLUG CATH AND CAP STER (CATHETERS) ×2 IMPLANT
SYR 30ML LL (SYRINGE) ×2 IMPLANT
SYRINGE IRR TOOMEY STRL 70CC (SYRINGE) IMPLANT
TUBE CONNECTING 12'X1/4 (SUCTIONS) ×1
TUBE CONNECTING 12X1/4 (SUCTIONS) ×1 IMPLANT
WATER STERILE IRR 500ML POUR (IV SOLUTION) ×2 IMPLANT

## 2017-12-11 NOTE — Anesthesia Procedure Notes (Signed)
Procedure Name: LMA Insertion Date/Time: 12/11/2017 7:29 AM Performed by: Genelle Bal, CRNA Pre-anesthesia Checklist: Patient identified, Emergency Drugs available, Suction available and Patient being monitored Patient Re-evaluated:Patient Re-evaluated prior to induction Oxygen Delivery Method: Circle system utilized Preoxygenation: Pre-oxygenation with 100% oxygen Induction Type: IV induction Ventilation: Mask ventilation without difficulty LMA: LMA inserted LMA Size: 4.0 Number of attempts: 1 Airway Equipment and Method: Bite block Placement Confirmation: positive ETCO2 Tube secured with: Tape Dental Injury: Teeth and Oropharynx as per pre-operative assessment

## 2017-12-11 NOTE — Anesthesia Postprocedure Evaluation (Signed)
Anesthesia Post Note  Patient: [name]  Procedure(s) Performed: CYSTOSCOPY WITH BILATERAL RETROGRADE PYELOGRAM, TRANSURETHRAL RESECTION OF BLADDER TUMOR (TURBT) (N/A Bladder)     Patient location during evaluation: PACU Anesthesia Type: General Level of consciousness: awake and alert Pain management: pain level controlled Vital Signs Assessment: post-procedure vital signs reviewed and stable Respiratory status: spontaneous breathing, nonlabored ventilation, respiratory function stable and patient connected to nasal cannula oxygen Cardiovascular status: blood pressure returned to baseline and stable Postop Assessment: no apparent nausea or vomiting Anesthetic complications: no    Last Vitals:  Vitals:   12/11/17 0900 12/11/17 0956  BP: 133/90 (!) 167/86  Pulse: 80 81  Resp: 12 16  Temp:  37.1 C  SpO2: 94% 98%    Last Pain:  Vitals:   12/11/17 0956  TempSrc: Oral  PainSc:                  Montez Hageman

## 2017-12-11 NOTE — Transfer of Care (Signed)
Immediate Anesthesia Transfer of Care Note  Patient: [name]  Procedure(s) Performed: CYSTOSCOPY WITH BILATERAL RETROGRADE PYELOGRAM, TRANSURETHRAL RESECTION OF BLADDER TUMOR (TURBT) (N/A Bladder)  Patient Location: PACU  Anesthesia Type:General  Level of Consciousness: awake, alert  and oriented  Airway & Oxygen Therapy: Patient Spontanous Breathing and Patient connected to nasal cannula oxygen  Post-op Assessment: Report given to RN and Post -op Vital signs reviewed and stable  Post vital signs: Reviewed and stable  Last Vitals:  Vitals:   12/11/17 0547  BP: (!) 152/89  Pulse: 72  Resp: 16  Temp: 36.7 C  SpO2: 98%    Last Pain:  Vitals:   12/11/17 0547  TempSrc: Oral      Patients Stated Pain Goal: 5 (39/67/28 9791)  Complications: No apparent anesthesia complications

## 2017-12-11 NOTE — Anesthesia Preprocedure Evaluation (Signed)
Anesthesia Evaluation  Patient identified by MRN, date of birth, ID band Patient awake    Reviewed: Allergy & Precautions, NPO status , Patient's Chart, lab work & pertinent test results  Airway Mallampati: II  TM Distance: >3 FB Neck ROM: Full    Dental no notable dental hx. (+) Upper Dentures, Lower Dentures   Pulmonary former smoker,    Pulmonary exam normal breath sounds clear to auscultation       Cardiovascular negative cardio ROS Normal cardiovascular exam Rhythm:Regular Rate:Normal     Neuro/Psych negative neurological ROS  negative psych ROS   GI/Hepatic Neg liver ROS, hiatal hernia, GERD  Medicated and Controlled,  Endo/Other  negative endocrine ROS  Renal/GU negative Renal ROS  negative genitourinary   Musculoskeletal negative musculoskeletal ROS (+)   Abdominal   Peds negative pediatric ROS (+)  Hematology negative hematology ROS (+)   Anesthesia Other Findings   Reproductive/Obstetrics negative OB ROS                             Anesthesia Physical Anesthesia Plan  ASA: II  Anesthesia Plan: General   Post-op Pain Management:    Induction: Intravenous  PONV Risk Score and Plan: 2 and Ondansetron and Treatment may vary due to age or medical condition  Airway Management Planned: LMA  Additional Equipment:   Intra-op Plan:   Post-operative Plan:   Informed Consent: I have reviewed the patients History and Physical, chart, labs and discussed the procedure including the risks, benefits and alternatives for the proposed anesthesia with the patient or authorized representative who has indicated his/her understanding and acceptance.   Dental advisory given  Plan Discussed with: CRNA  Anesthesia Plan Comments:         Anesthesia Quick Evaluation

## 2017-12-11 NOTE — H&P (Signed)
[name] is an 77 y.o. male.    Chief Complaint: Pre-op Transurethral Resection of Bladder Tumor  HPI:   1 - Gross Hematuria / Bladder Mass - 3cm bladder mass by non-con CT 11/2017 on eval gross hematuria. Remote 30 PY smoker. Cysto 11/2017 confirms nodular anterior / dome mass.   2 - Enlarged Prostate with Urinary Urgency - on tamsulosin at baseline with good control of obstructive LUTS. Prostate vol 140mL with median lobe by CT 11/2017.   3 - Stage 3 Renal Insufficiency - Cr 1.4's by PCP labs. CT 2019 w/o hydro.   PMH sig for HLD, Umbilical hernia repair with mesh. NO ischemic CV disease / blood thinners. His PCP is [facility].   Today " [name]" is seen to proceed with TURBT / retrograes for new bladder cance.r No interval fevers. Most recent UA without infectious parameters.    Past Medical History:  Diagnosis Date  . Bladder cancer [facility])    urologist-  dr Tresa Moore  . BPH (benign prostatic hyperplasia)   . Full dentures   . GERD (gastroesophageal reflux disease)   . Hiatal hernia   . History of bladder stone   . History of kidney stones    per pt hx passed stones spontenaously  . Hypercholesterolemia   . Wears glasses   . Wears hearing aid in both ears     Past Surgical History:  Procedure Laterality Date  . COLONOSCOPY WITH ESOPHAGOGASTRODUODENOSCOPY (EGD)  04-08-2012  dr [name]  . CYSTO/  BLADDER STONE EXTRACTIONS  1998 approx.  Marland Kitchen UMBILICAL HERNIA REPAIR  2008  . VASECTOMY  1980s    Family History  Problem Relation Age of Onset  . Colon cancer Paternal Grandfather    Social History:  reports that he quit smoking about 19 years ago. His smoking use included cigarettes. He has a 10.00 pack-year smoking history. he has never used smokeless tobacco. He reports that he drinks alcohol. He reports that he does not use drugs.  Allergies: No Known Allergies  Medications Prior to Admission  Medication Sig Dispense Refill  . fenofibrate 160 MG tablet Take 160 mg  by mouth at bedtime.    Marland Kitchen omeprazole (PRILOSEC) 20 MG capsule Take 20 mg by mouth every morning.     . simvastatin (ZOCOR) 40 MG tablet Take 40 mg by mouth at bedtime.    . tamsulosin (FLOMAX) 0.4 MG CAPS capsule Take 0.4 mg by mouth daily after breakfast.      No results found for this or any previous visit (from the past 48 hour(s)). No results found.  Review of Systems  Constitutional: Negative.  Negative for chills and fever.  HENT: Negative.   Eyes: Negative.   Respiratory: Negative.   Cardiovascular: Negative.   Gastrointestinal: Negative.   Genitourinary: Negative.   Musculoskeletal: Negative.   Skin: Negative.   Neurological: Negative.   Endo/Heme/Allergies: Negative.   Psychiatric/Behavioral: Negative.     Blood pressure (!) 152/89, pulse 72, temperature 98 F (36.7 C), temperature source Oral, resp. rate 16, height 5\' 8"  (1.727 m), weight 75.2 kg (165 lb 12.8 oz), SpO2 98 %. Physical Exam  Constitutional: He appears well-developed.  HENT:  Head: Normocephalic.  Neck: Normal range of motion.  Cardiovascular: Normal rate.  GI: Soft.  Mild truncal obesity.   Genitourinary:  Genitourinary Comments: No CVAT  Neurological: He is alert.  Skin: Skin is warm.  Psychiatric: He has a normal mood and affect.     Assessment/Plan  Proceed as planned with cysto, bilateral retrogrades, TURBT for diagnostic and theraputic intent. Risks, benefits, alternatives, expected peri-op course, likley need for additional cancer therapy discussed previously and reiterated otday.  Alexis Frock, MD 12/11/2017, 6:21 AM

## 2017-12-11 NOTE — Discharge Instructions (Signed)
1 - You may have urinary urgency (bladder spasms) and bloody urine on / off  X few days. This is normal.  2 - Call MD or go to ER for fever >102, severe pain / nausea / vomiting not relieved by medications, or acute change in medical status.   Post Anesthesia Home Care Instructions  Activity: Get plenty of rest for the remainder of the day. A responsible individual must stay with you for 24 hours following the procedure.  For the next 24 hours, DO NOT: -Drive a car -Paediatric nurse -Drink alcoholic beverages -Take any medication unless instructed by your physician -Make any legal decisions or sign important papers.  Meals: Start with liquid foods such as gelatin or soup. Progress to regular foods as tolerated. Avoid greasy, spicy, heavy foods. If nausea and/or vomiting occur, drink only clear liquids until the nausea and/or vomiting subsides. Call your physician if vomiting continues.  Special Instructions/Symptoms: Your throat may feel dry or sore from the anesthesia or the breathing tube placed in your throat during surgery. If this causes discomfort, gargle with warm salt water. The discomfort should disappear within 24 hours.

## 2017-12-11 NOTE — Brief Op Note (Signed)
12/11/2017  8:11 AM  PATIENT:  [name]  77 y.o. male  PRE-OPERATIVE DIAGNOSIS:  BLADDER CANCER  POST-OPERATIVE DIAGNOSIS:  BLADDER CANCER  PROCEDURE:  Procedure(s): CYSTOSCOPY WITH BILATERAL RETROGRADE PYELOGRAM, TRANSURETHRAL RESECTION OF BLADDER TUMOR (TURBT) (N/A)  SURGEON:  Surgeon(s) and Role:    * Alexis Frock, MD - Primary  PHYSICIAN ASSISTANT:   ASSISTANTS: none   ANESTHESIA:   general  EBL:  52mL  BLOOD ADMINISTERED:none  DRAINS: 49f foley to gravity, irrigation port plugged   LOCAL MEDICATIONS USED:  NONE  SPECIMEN:  Source of Specimen:  1 - bladder tumor, 2 -base of bladder tumor, 3 - prostatic uretrha   DISPOSITION OF SPECIMEN:  PATHOLOGY  COUNTS:  YES  TOURNIQUET:  * No tourniquets in log *  DICTATION: .Other Dictation: Dictation Number [id number]  PLAN OF CARE: Discharge to home after PACU  PATIENT DISPOSITION:  PACU - hemodynamically stable.   Delay start of Pharmacological VTE agent (>24hrs) due to surgical blood loss or risk of bleeding: yes

## 2017-12-14 ENCOUNTER — Encounter (HOSPITAL_BASED_OUTPATIENT_CLINIC_OR_DEPARTMENT_OTHER): Payer: Self-pay | Admitting: Urology

## 2017-12-14 NOTE — Op Note (Signed)
NAME:  [name], [name]                      ACCOUNT NO.:  MEDICAL RECORD NO.:  [id number]  LOCATION:                                 FACILITY:  PHYSICIAN:  Alexis Frock, MD          DATE OF BIRTH:  DATE OF PROCEDURE: 12/11/2017                                OPERATIVE REPORT   DIAGNOSIS:  Bladder cancer.  Gross hematuria.  PROCEDURES: 1. Transurethral resection of bladder tumor, volume medium. 2. Bilateral retrograde pyelograms with interpretation.  ESTIMATED BLOOD LOSS:  50 cc.  COMPLICATION:  None.  SPECIMENS: 1. Bladder tumor. 2. Base of bladder tumor. 3. Prostatic urethral biopsy.  FINDINGS: 1. Some shaggy papillary changes of the left lobe of the prostate     worrisome for possible urothelial carcinoma, this was biopsied. 2. Nodular vascular broad-based tumor at the anterior wall of the     bladder, highly concerning for invasive high-grade disease. 3. Trilobar hypertrophy. 4. Unremarkable bilateral retrograde pyelograms.  INDICATION:  [name] is a very pleasant 77 year old gentleman, who was found on workup of recent gross hematuria to have a calcified mass in the bladder.  He was evaluated in the office with cystoscopy, which corroborated a nodular bladder cancer as well as trilobar prostatic hypertrophy.  Options were discussed including recommended path with operative transurethral resection for diagnostic and staging purposes and he wished to proceed.  Informed consent was obtained and placed in the medical record.  PROCEDURE IN DETAIL:  The patient being [name], was verified. Procedure being transurethral resection of bladder tumor and bilateral retrogrades was confirmed.  Procedure was carried out.  Time-out was performed.  Intravenous antibiotics were administered.  General anesthesia was introduced.  The patient was placed into a medium lithotomy position.  Sterile field was created by prepping and draping the patient's penis, perineum and  proximal thighs using iodine. Cystourethroscopy was initially performed using 22-French rigid scope with offset lens.  Inspection of the anterior-posterior urethra revealed large prostatic hypertrophy, trilobar, moderate trabeculation of the bladder.  There was a nodular broad-based tumor with calcifications of the anterior aspect of the bladder as anticipated.  Attention was directed to bilateral retrograde pyelograms.  The left ureteral orifice was cannulated with a 6-French end-hole catheter and left retrograde pyelogram was obtained.  Left retrograde pyelogram demonstrated a single left ureter with single- system left kidney.  No filling defects or narrowing noted.  There was J- hooking.  Similarly, right retrograde pyelogram was obtained.  Right retrograde pyelogram demonstrated a single right ureter with single-system right kidney.  No hydronephrosis, filling defects or narrowing noted.  The cystoscope was then exchanged for the 26-French resectoscope sheath with visual obturator and the area of papillary change at the prostatic urethra was biopsied using bipolar resectoscope loop, this was set aside for permanent pathology.  The base of this was coagulated.  Attention was directed at bladder tumor resection.  Very careful systematic resection was performed of the entire bladder tumor down to what appeared to be the superficial fibromuscular stroma of the bladder.  Taken exquisite care to avoid bladder perforation, which did not occur.  Grossly, there  appeared to be tumor still at the deep aspect within the muscle most consistent with likely invasive disease.  Bladder tumor fragments were irrigated and set aside for permanent pathology, labeled as such.  Next, cold cup biopsy forceps were used to obtain representative samples of the base of bladder tumor-muscular junction. This was set aside, labeled base of bladder tumor.  The resectoscope loop was once again used to coagulate  the entire base of the resection area and the edges.  There was no evidence of bladder perforation. Given the patient's large trilobar hypertrophy and relatively large tumor approximately 4 cm2 resected, it was felt that brief interval catheterization would be warranted.  As such, a new 22-French 3-way hematuria catheter was placed per urethra to straight drain, 10 cc of sterile water in the balloon.  Irrigation port was plugged, and the procedure was terminated.  The patient tolerated the procedure well. There were no immediate periprocedural complications.  The patient was taken to the postanesthesia care unit in stable condition.          ______________________________ Alexis Frock, MD     TM/MEDQ  D:  12/11/2017  T:  12/12/2017  Job:  [id number]

## 2017-12-16 ENCOUNTER — Encounter: Payer: Self-pay | Admitting: Oncology

## 2017-12-16 DIAGNOSIS — R339 Retention of urine, unspecified: Secondary | ICD-10-CM | POA: Diagnosis not present

## 2017-12-28 DIAGNOSIS — R972 Elevated prostate specific antigen [PSA]: Secondary | ICD-10-CM | POA: Diagnosis not present

## 2017-12-28 DIAGNOSIS — C670 Malignant neoplasm of trigone of bladder: Secondary | ICD-10-CM | POA: Diagnosis not present

## 2017-12-29 ENCOUNTER — Inpatient Hospital Stay: Payer: Medicare Other | Attending: Oncology | Admitting: Oncology

## 2017-12-29 ENCOUNTER — Inpatient Hospital Stay: Payer: Medicare Other

## 2017-12-29 DIAGNOSIS — C678 Malignant neoplasm of overlapping sites of bladder: Secondary | ICD-10-CM

## 2017-12-29 DIAGNOSIS — C679 Malignant neoplasm of bladder, unspecified: Secondary | ICD-10-CM | POA: Diagnosis not present

## 2017-12-29 LAB — CMP (CANCER CENTER ONLY)
ALBUMIN: 4.2 g/dL (ref 3.5–5.0)
ALT: 15 U/L (ref 0–55)
ANION GAP: 8 (ref 3–11)
AST: 17 U/L (ref 5–34)
Alkaline Phosphatase: 70 U/L (ref 40–150)
BUN: 22 mg/dL (ref 7–26)
CALCIUM: 10 mg/dL (ref 8.4–10.4)
CO2: 26 mmol/L (ref 22–29)
Chloride: 105 mmol/L (ref 98–109)
Creatinine: 1.22 mg/dL (ref 0.70–1.30)
GFR, Estimated: 57 mL/min — ABNORMAL LOW (ref >60)
GLUCOSE: 92 mg/dL (ref 70–140)
POTASSIUM: 4.3 mmol/L (ref 3.5–5.1)
SODIUM: 139 mmol/L (ref 136–145)
Total Bilirubin: 1.4 mg/dL — ABNORMAL HIGH (ref 0.2–1.2)
Total Protein: 7.4 g/dL (ref 6.4–8.3)

## 2017-12-29 LAB — CBC WITH DIFFERENTIAL (CANCER CENTER ONLY)
BASOS ABS: 0 10*3/uL (ref 0.0–0.1)
BASOS PCT: 0 %
Eosinophils Absolute: 0.2 10*3/uL (ref 0.0–0.5)
Eosinophils Relative: 2 %
HEMATOCRIT: 42.2 % (ref 38.4–49.9)
HEMOGLOBIN: 13.9 g/dL (ref 13.0–17.1)
LYMPHS PCT: 19 %
Lymphs Abs: 1.4 10*3/uL (ref 0.9–3.3)
MCH: 29.8 pg (ref 27.2–33.4)
MCHC: 32.9 g/dL (ref 32.0–36.0)
MCV: 90.4 fL (ref 79.3–98.0)
MONO ABS: 0.6 10*3/uL (ref 0.1–0.9)
MONOS PCT: 8 %
NEUTROS PCT: 71 %
Neutro Abs: 5.4 10*3/uL (ref 1.5–6.5)
Platelet Count: 236 10*3/uL (ref 140–400)
RBC: 4.67 MIL/uL (ref 4.20–5.82)
RDW: 12.8 % (ref 11.0–14.6)
WBC Count: 7.6 10*3/uL (ref 4.0–10.3)

## 2017-12-29 MED ORDER — PROCHLORPERAZINE MALEATE 10 MG PO TABS: 10.0000 mg | ORAL_TABLET | Freq: Four times a day (QID) | ORAL | 0 refills | Status: DC | PRN

## 2017-12-29 MED ORDER — LIDOCAINE-PRILOCAINE 2.5-2.5 % EX CREA: 1.0000 "application " | TOPICAL_CREAM | CUTANEOUS | 0 refills | Status: DC | PRN

## 2017-12-29 NOTE — Progress Notes (Signed)
START ON PATHWAY REGIMEN - Bladder     A cycle is every 21 days:     Gemcitabine      Cisplatin   **Always confirm dose/schedule in your pharmacy ordering system**  Patient Characteristics: Pre Cystectomy, Clinical T2-T4a, N0-1, M0, Cystectomy Eligible, Cisplatin-Based Chemotherapy Indicated (CrCl ? 50 mL/min and Minimal or No Symptoms) AJCC M Category: M0 AJCC N Category: N0 AJCC T Category: T2 Current evidence of distant metastases<= No AJCC 8 Stage Grouping: II Intent of Therapy: Curative Intent, Discussed with Patient 

## 2017-12-29 NOTE — Progress Notes (Signed)
Reason for Referral: Bladder cancer  HPI: 77 year old gentleman currently of [name] where he lived the majority of his life.  A rather healthy gentleman with history of BPH  no significant comorbid conditions.  He started developing right lower quadrant pain with periodic hematuria and was evaluated by his primary care physician.  CT scan obtained on November 27, 2017 of the abdomen and pelvis showed 2.3 x 1.8 cm lobulated mass within the bladder wall concerning for bladder neoplasm.  No hydronephrosis or lymphadenopathy noted.  He was subsequently evaluated by Dr. Tresa Moore and underwent a TURBT on December 11, 2017.  The final pathology showed high-grade invasive urothelial carcinoma with involvement of the muscularis propria at the base of the bladder.  He had another tumor that is superficial but still high-grade urothelial carcinoma.  He tolerated the procedure well and his abdominal pain has resolved.  He does report very periodic and rare hematuria.  He was evaluated by Dr. Tresa Moore and felt he would be a candidate for a radical cystectomy and potentially neoadjuvant chemotherapy.  He remains active and continues to work part-time although retiring in the near future.  He still able to drive and attends to activities of daily living.  He does not report any headaches, blurry vision, syncope or seizures. Does not report any fevers, chills or sweats.  Does not report any cough, wheezing or hemoptysis.  Does not report any chest pain, palpitation, orthopnea or leg edema.  Does not report any nausea, vomiting or abdominal pain.  Does not report any constipation or diarrhea.  Does not report any skeletal complaints.    Does not report frequency, urgency or hematuria.  Does not report any skin rashes or lesions. Does not report any heat or cold intolerance.  Does not report any lymphadenopathy or petechiae.  Does not report any anxiety or depression.  Remaining review of systems is negative.    Past Medical  History:  Diagnosis Date  . Bladder cancer [facility])    urologist-  dr Tresa Moore  . BPH (benign prostatic hyperplasia)   . Full dentures   . GERD (gastroesophageal reflux disease)   . Hiatal hernia   . History of bladder stone   . History of kidney stones    per pt hx passed stones spontenaously  . Hypercholesterolemia   . Wears glasses   . Wears hearing aid in both ears   :  Past Surgical History:  Procedure Laterality Date  . COLONOSCOPY WITH ESOPHAGOGASTRODUODENOSCOPY (EGD)  04-08-2012  dr [name]  . CYSTO/  BLADDER STONE EXTRACTIONS  1998 approx.  . TRANSURETHRAL RESECTION OF BLADDER TUMOR N/A 12/11/2017   Procedure: CYSTOSCOPY WITH BILATERAL RETROGRADE PYELOGRAM, TRANSURETHRAL RESECTION OF BLADDER TUMOR (TURBT);  Surgeon: Alexis Frock, MD;  Location: [facility];  Service: Urology;  Laterality: N/A;  . UMBILICAL HERNIA REPAIR  2008  . VASECTOMY  1980s  :   Current Outpatient Medications:  .  fenofibrate 160 MG tablet, Take 160 mg by mouth at bedtime., Disp: , Rfl:  .  omeprazole (PRILOSEC) 20 MG capsule, Take 20 mg by mouth every morning. , Disp: , Rfl:  .  senna-docusate (SENOKOT-S) 8.6-50 MG tablet, Take 1 tablet by mouth 2 (two) times daily. While taking strong pain meds to prevent constipation., Disp: 20 tablet, Rfl: 0 .  simvastatin (ZOCOR) 40 MG tablet, Take 40 mg by mouth at bedtime., Disp: , Rfl:  .  tamsulosin (FLOMAX) 0.4 MG CAPS capsule, Take 0.4 mg by mouth daily after breakfast.,  Disp: , Rfl:  .  lidocaine-prilocaine (EMLA) cream, Apply 1 application topically as needed., Disp: 30 g, Rfl: 0 .  prochlorperazine (COMPAZINE) 10 MG tablet, Take 1 tablet (10 mg total) by mouth every 6 (six) hours as needed for nausea or vomiting., Disp: 30 tablet, Rfl: 0:  No Known Allergies:  Family History  Problem Relation Age of Onset  . Colon cancer Paternal Grandfather   :  Social History   Socioeconomic History  . Marital status: Married    Spouse name: Not on  file  . Number of children: 2  . Years of education: Not on file  . Highest education level: Not on file  Social Needs  . Financial resource strain: Not on file  . Food insecurity - worry: Not on file  . Food insecurity - inability: Not on file  . Transportation needs - medical: Not on file  . Transportation needs - non-medical: Not on file  Occupational History  . Occupation: retired, [employer]    Employer: [employer]  Tobacco Use  . Smoking status: Former Smoker    Packs/day: 0.50    Years: 20.00    Pack years: 10.00    Types: Cigarettes    Last attempt to quit: 03/26/1998    Years since quitting: 19.7  . Smokeless tobacco: Never Used  Substance and Sexual Activity  . Alcohol use: Yes    Comment: occasional wine, beer  . Drug use: No  . Sexual activity: Not on file  Other Topics Concern  . Not on file  Social History Narrative   Lives w/ wife  :  Pertinent items are noted in HPI.  Exam: Blood pressure 133/76, pulse 77, temperature 97.9 F (36.6 C), temperature source Oral, resp. rate 20, height 5\' 8"  (1.727 m), weight 165 lb 14.4 oz (75.3 kg), SpO2 99 %.  ECOG 0 General appearance: alert and cooperative appeared without distress. Head: atraumatic without any abnormalities. Eyes: conjunctivae/corneas clear. PERRL.  Sclera anicteric. Throat: lips, mucosa, and tongue normal; without oral thrush or ulcers. Resp: clear to auscultation bilaterally without rhonchi, wheezes or dullness to percussion. Cardio: regular rate and rhythm, S1, S2 normal, no murmur, click, rub or gallop GI: soft, non-tender; bowel sounds normal; no masses,  no organomegaly Skin: Skin color, texture, turgor normal. No rashes or lesions Lymph nodes: Cervical, supraclavicular, and axillary nodes normal. Neurologic: Grossly normal without any motor, sensory or deep tendon reflexes. Musculoskeletal: No joint deformity or effusion.  CBC    Component Value Date/Time   WBC 7.2 08/28/2011  1025   RBC 4.77 08/28/2011 1025   HGB 14.6 12/11/2017 0628   HCT 45.8 08/28/2011 1025   PLT 173 08/28/2011 1025   MCV 96.0 08/28/2011 1025   MCH 31.4 08/28/2011 1025   MCHC 32.8 08/28/2011 1025   RDW 13.0 08/28/2011 1025   LYMPHSABS 1.9 08/28/2011 1025   MONOABS 0.5 08/28/2011 1025   EOSABS 0.2 08/28/2011 1025   BASOSABS 0.0 08/28/2011 1025     Chemistry      Component Value Date/Time   CREATININE 0.95 08/01/2011 1140      Component Value Date/Time   ALKPHOS 81 08/01/2011 1140   AST 22 08/01/2011 1140   ALT 23 08/01/2011 1140   BILITOT 2.3 (H) 08/01/2011 1140       Assessment and Plan:    77 year old gentleman with the following issues:  1.  High-grade urothelial carcinoma of the bladder diagnosed on December 11, 2017.  He presented with hematuria  and pelvic pain and found to have a tumor at the base of the bladder that is muscle invasive indicating at least T2N0 disease.  No lymphadenopathy or metastatic disease noted.  The natural course of this disease was reviewed today with the patient.  Options of therapy were discussed at this time.  Given his excellent performance status and reasonable health, radical cystectomy appears to be his best option at this time.  Trimodality therapy would be a reasonable alternative if surgery is not an option.  He is open to any idea of having curative surgery which offers him the best chance of cure at this time.  The rationale for using neoadjuvant chemotherapy was discussed today in detail.  Risks and benefits associated with this approach was reviewed including improvement overall survival close to 5% increment.  Complication associated with combination of gemcitabine and cisplatin was reviewed today.  These complications include nausea, vomiting, myelosuppression, neutropenia, neutropenic sepsis, electrolyte imbalance, infusion related complications, hypokalemia.  Serious complications include thrombosis, sepsis, hospitalization and rarely  death.  After discussion today, he is agreeable to proceed after chemotherapy education class.  The plan is to proceed with 4 cycles before proceeding with radical cystectomy.  2.  IV access: Risks and benefits of Port-A-Cath insertion was discussed today.  Complications that include bleeding, thrombosis and infection.  He is agreeable and prefers a Port-A-Cath insertion.  3.  Antiemetics: Prescription for Compazine was given to the patient today.  4.  Renal function surveillance: We will obtain baseline creatinine on him today and determine cisplatin eligibility.  His last creatinine in 2012 was within normal range.  We have discussed strategies to avoid nephrotoxic agents including nonsteroidal anti-inflammatories.  5.  Liver function test surveillance: We will repeat his liver function test as they were abnormal in 2012.  These will be monitored periodically.  6.  Follow-up: We will be in the next few weeks to start therapy.  60  minutes was spent with the patient face-to-face today.  More than 50% of time was dedicated to patient counseling, education and coordination of his multifaceted care.

## 2017-12-30 ENCOUNTER — Telehealth: Payer: Self-pay

## 2017-12-30 ENCOUNTER — Encounter: Payer: Self-pay | Admitting: Oncology

## 2017-12-30 ENCOUNTER — Inpatient Hospital Stay: Payer: Medicare Other

## 2017-12-30 NOTE — Telephone Encounter (Signed)
Spoke with patient on the phone concerning additional appointments added to his current schedule. He will pick up a copy after chemo edu. Class this afternoon. Per 3/20 los

## 2017-12-30 NOTE — Progress Notes (Signed)
Met with patient and spouse to introduce myself as Arboriculturist and to ask if they had any financial questions or concerns. Patient has two insurances and more than likely not need any assistance. Gave them an application for the [employer] for patients whom live in [place] with personal expenses while going through treatment. Advised he can mail application along with supporting documents or bring back to me to email.  Discussed the one-time $400 [facility]. Based on verbal income, they are overqualified. They have my card for any additional financial questions or concerns.

## 2018-01-04 ENCOUNTER — Encounter: Payer: Self-pay | Admitting: *Deleted

## 2018-01-05 ENCOUNTER — Other Ambulatory Visit: Payer: Self-pay | Admitting: Radiology

## 2018-01-07 ENCOUNTER — Ambulatory Visit (HOSPITAL_COMMUNITY)
Admission: RE | Admit: 2018-01-07 | Discharge: 2018-01-07 | Disposition: A | Discharge status: Home / Self Care | Payer: Medicare Other | Source: Ambulatory Visit | Attending: Oncology | Admitting: Oncology

## 2018-01-07 ENCOUNTER — Other Ambulatory Visit: Payer: Self-pay | Admitting: Oncology

## 2018-01-07 ENCOUNTER — Encounter (HOSPITAL_COMMUNITY): Payer: Self-pay

## 2018-01-07 DIAGNOSIS — C679 Malignant neoplasm of bladder, unspecified: Secondary | ICD-10-CM | POA: Insufficient documentation

## 2018-01-07 DIAGNOSIS — Z87891 Personal history of nicotine dependence: Secondary | ICD-10-CM | POA: Diagnosis not present

## 2018-01-07 DIAGNOSIS — Z5111 Encounter for antineoplastic chemotherapy: Secondary | ICD-10-CM | POA: Diagnosis not present

## 2018-01-07 DIAGNOSIS — K219 Gastro-esophageal reflux disease without esophagitis: Secondary | ICD-10-CM | POA: Diagnosis not present

## 2018-01-07 DIAGNOSIS — E7800 Pure hypercholesterolemia, unspecified: Secondary | ICD-10-CM | POA: Insufficient documentation

## 2018-01-07 DIAGNOSIS — C678 Malignant neoplasm of overlapping sites of bladder: Secondary | ICD-10-CM

## 2018-01-07 HISTORY — PX: IR US GUIDE VASC ACCESS RIGHT: IMG2390

## 2018-01-07 HISTORY — PX: IR FLUORO GUIDE PORT INSERTION RIGHT: IMG5741

## 2018-01-07 LAB — CBC WITH DIFFERENTIAL/PLATELET
Basophils Absolute: 0 10*3/uL (ref 0.0–0.1)
Basophils Relative: 0 %
EOS ABS: 0.1 10*3/uL (ref 0.0–0.7)
Eosinophils Relative: 2 %
HEMATOCRIT: 43.7 % (ref 39.0–52.0)
HEMOGLOBIN: 14.1 g/dL (ref 13.0–17.0)
LYMPHS ABS: 1.4 10*3/uL (ref 0.7–4.0)
LYMPHS PCT: 20 %
MCH: 29.5 pg (ref 26.0–34.0)
MCHC: 32.3 g/dL (ref 30.0–36.0)
MCV: 91.4 fL (ref 78.0–100.0)
MONOS PCT: 7 %
Monocytes Absolute: 0.5 10*3/uL (ref 0.1–1.0)
NEUTROS PCT: 71 %
Neutro Abs: 4.9 10*3/uL (ref 1.7–7.7)
Platelets: 229 10*3/uL (ref 150–400)
RBC: 4.78 MIL/uL (ref 4.22–5.81)
RDW: 13 % (ref 11.5–15.5)
WBC: 7 10*3/uL (ref 4.0–10.5)

## 2018-01-07 LAB — PROTIME-INR
INR: 1.06
Prothrombin Time: 13.7 seconds (ref 11.4–15.2)

## 2018-01-07 MED ORDER — FENTANYL CITRATE (PF) 100 MCG/2ML IJ SOLN
INTRAMUSCULAR | Status: AC
  Filled 2018-01-07: qty 4

## 2018-01-07 MED ORDER — CEFAZOLIN SODIUM-DEXTROSE 2-4 GM/100ML-% IV SOLN
INTRAVENOUS | Status: AC
  Administered 2018-01-07: 2 g via INTRAVENOUS
  Filled 2018-01-07: qty 100

## 2018-01-07 MED ORDER — FENTANYL CITRATE (PF) 100 MCG/2ML IJ SOLN
INTRAMUSCULAR | Status: AC | PRN
  Administered 2018-01-07: 25 ug via INTRAVENOUS
  Administered 2018-01-07 (×2): 50 ug via INTRAVENOUS
  Administered 2018-01-07: 25 ug via INTRAVENOUS

## 2018-01-07 MED ORDER — MIDAZOLAM HCL 2 MG/2ML IJ SOLN
INTRAMUSCULAR | Status: AC | PRN
  Administered 2018-01-07: 1 mg via INTRAVENOUS
  Administered 2018-01-07 (×3): 0.5 mg via INTRAVENOUS

## 2018-01-07 MED ORDER — LIDOCAINE HCL 1 % IJ SOLN
INTRAMUSCULAR | Status: AC
  Filled 2018-01-07: qty 20

## 2018-01-07 MED ORDER — LIDOCAINE HCL 1 % IJ SOLN
INTRAMUSCULAR | Status: AC | PRN
  Administered 2018-01-07: 20 mL

## 2018-01-07 MED ORDER — SODIUM CHLORIDE 0.9 % IV SOLN
INTRAVENOUS | Status: DC
  Administered 2018-01-07: 13:00:00 via INTRAVENOUS

## 2018-01-07 MED ORDER — MIDAZOLAM HCL 2 MG/2ML IJ SOLN
INTRAMUSCULAR | Status: AC
  Filled 2018-01-07: qty 4

## 2018-01-07 MED ORDER — HEPARIN SOD (PORK) LOCK FLUSH 100 UNIT/ML IV SOLN
INTRAVENOUS | Status: AC
  Filled 2018-01-07: qty 5

## 2018-01-07 MED ORDER — CEFAZOLIN SODIUM-DEXTROSE 2-4 GM/100ML-% IV SOLN
2.0000 g | INTRAVENOUS | Status: AC
  Administered 2018-01-07: 2 g via INTRAVENOUS

## 2018-01-07 NOTE — Sedation Documentation (Signed)
Port pocket being sutured at this time

## 2018-01-07 NOTE — Discharge Instructions (Signed)
Implanted Port Insertion, Care After °This sheet gives you information about how to care for yourself after your procedure. Your health care provider may also give you more specific instructions. If you have problems or questions, contact your health care provider. °What can I expect after the procedure? °After your procedure, it is common to have: °· Discomfort at the port insertion site. °· Bruising on the skin over the port. This should improve over 3-4 days. ° °Follow these instructions at home: °Port care °· After your port is placed, you will get a manufacturer's information card. The card has information about your port. Keep this card with you at all times. °· Take care of the port as told by your health care provider. Ask your health care provider if you or a family member can get training for taking care of the port at home. A home health care nurse may also take care of the port. °· Make sure to remember what type of port you have. °Incision care °· Follow instructions from your health care provider about how to take care of your port insertion site. Make sure you: °? Wash your hands with soap and water before you change your bandage (dressing). If soap and water are not available, use hand sanitizer. °? Change your dressing as told by your health care provider. °? Leave stitches (sutures), skin glue, or adhesive strips in place. These skin closures may need to stay in place for 2 weeks or longer. If adhesive strip edges start to loosen and curl up, you may trim the loose edges. Do not remove adhesive strips completely unless your health care provider tells you to do that. °· Check your port insertion site every day for signs of infection. Check for: °? More redness, swelling, or pain. °? More fluid or blood. °? Warmth. °? Pus or a bad smell. °General instructions °· Do not take baths, swim, or use a hot tub until your health care provider approves. °· Do not lift anything that is heavier than 10 lb (4.5  kg) for a week, or as told by your health care provider. °· Ask your health care provider when it is okay to: °? Return to work or school. °? Resume usual physical activities or sports. °· Do not drive for 24 hours if you were given a medicine to help you relax (sedative). °· Take over-the-counter and prescription medicines only as told by your health care provider. °· Wear a medical alert bracelet in case of an emergency. This will tell any health care providers that you have a port. °· Keep all follow-up visits as told by your health care provider. This is important. °Contact a health care provider if: °· You cannot flush your port with saline as directed, or you cannot draw blood from the port. °· You have a fever or chills. °· You have more redness, swelling, or pain around your port insertion site. °· You have more fluid or blood coming from your port insertion site. °· Your port insertion site feels warm to the touch. °· You have pus or a bad smell coming from the port insertion site. °Get help right away if: °· You have chest pain or shortness of breath. °· You have bleeding from your port that you cannot control. °Summary °· Take care of the port as told by your health care provider. °· Change your dressing as told by your health care provider. °· Keep all follow-up visits as told by your health care provider. °  This information is not intended to replace advice given to you by your health care provider. Make sure you discuss any questions you have with your health care provider. °Document Released: 07/20/2013 Document Revised: 08/20/2016 Document Reviewed: 08/20/2016 °Elsevier Interactive Patient Education © 2017 [employer] °Moderate Conscious Sedation, Adult, Care After °These instructions provide you with information about caring for yourself after your procedure. Your health care provider may also give you more specific instructions. Your treatment has been planned according to current medical  practices, but problems sometimes occur. Call your health care provider if you have any problems or questions after your procedure. °What can I expect after the procedure? °After your procedure, it is common: °· To feel sleepy for several hours. °· To feel clumsy and have poor balance for several hours. °· To have poor judgment for several hours. °· To vomit if you eat too soon. ° °Follow these instructions at home: °For at least 24 hours after the procedure: ° °· Do not: °? Participate in activities where you could fall or become injured. °? Drive. °? Use heavy machinery. °? Drink alcohol. °? Take sleeping pills or medicines that cause drowsiness. °? Make important decisions or sign legal documents. °? Take care of children on your own. °· Rest. °Eating and drinking °· Follow the diet recommended by your health care provider. °· If you vomit: °? Drink water, juice, or soup when you can drink without vomiting. °? Make sure you have little or no nausea before eating solid foods. °General instructions °· Have a responsible adult stay with you until you are awake and alert. °· Take over-the-counter and prescription medicines only as told by your health care provider. °· If you smoke, do not smoke without supervision. °· Keep all follow-up visits as told by your health care provider. This is important. °Contact a health care provider if: °· You keep feeling nauseous or you keep vomiting. °· You feel light-headed. °· You develop a rash. °· You have a fever. °Get help right away if: °· You have trouble breathing. °This information is not intended to replace advice given to you by your health care provider. Make sure you discuss any questions you have with your health care provider. °Document Released: 07/20/2013 Document Revised: 03/03/2016 Document Reviewed: 01/19/2016 °Elsevier Interactive Patient Education © 2018 [employer] ° °

## 2018-01-07 NOTE — Procedures (Signed)
Interventional Radiology Procedure Note  Procedure: Single Lumen Power Port Placement    Access:  Right IJ vein.  Findings: Catheter tip positioned at SVC/RA junction. Port is ready for immediate use.   Complications: None  EBL: < 10 mL  Recommendations:  - Ok to shower in 24 hours - Do not submerge for 7 days - Routine line care   Demetric Parslow T. Telisha Zawadzki, M.D Pager:  [phone]   

## 2018-01-07 NOTE — Consult Note (Signed)
Chief Complaint: Patient was seen in consultation today for Port-A-Cath placement  Referring Physician(s): [name]  Supervising Physician: [name]  Patient Status: [facility] - Out-pt  History of Present Illness: [name] is a 77 y.o. male, former smoker, with history of recently diagnosed high-grade bladder carcinoma who presents today for Port-A-Cath placement for chemotherapy prior to planned cystectomy.  Past Medical History:  Diagnosis Date  . Bladder cancer [facility])    urologist-  dr [name]  . BPH (benign prostatic hyperplasia)   . Full dentures   . GERD (gastroesophageal reflux disease)   . Hiatal hernia   . History of bladder stone   . History of kidney stones    per pt hx passed stones spontenaously  . Hypercholesterolemia   . Wears glasses   . Wears hearing aid in both ears     Past Surgical History:  Procedure Laterality Date  . COLONOSCOPY WITH ESOPHAGOGASTRODUODENOSCOPY (EGD)  04-08-2012  dr [name]  . CYSTO/  BLADDER STONE EXTRACTIONS  1998 approx.  . TRANSURETHRAL RESECTION OF BLADDER TUMOR N/A 12/11/2017   Procedure: CYSTOSCOPY WITH BILATERAL RETROGRADE PYELOGRAM, TRANSURETHRAL RESECTION OF BLADDER TUMOR (TURBT);  Surgeon: Damon Frock, MD;  Location: [facility];  Service: Urology;  Laterality: N/A;  . UMBILICAL HERNIA REPAIR  2008  . VASECTOMY  1980s    Allergies: Patient has no known allergies.  Medications: Prior to Admission medications   Medication Sig Start Date End Date Taking? Authorizing Provider  fenofibrate 160 MG tablet Take 160 mg by mouth at bedtime.   Yes [provider]  omeprazole (PRILOSEC) 20 MG capsule Take 20 mg by mouth every morning.  03/26/12  Yes Damon Meuse, NP  simvastatin (ZOCOR) 40 MG tablet Take 40 mg by mouth at bedtime.   Yes [provider]  tamsulosin (FLOMAX) 0.4 MG CAPS capsule Take 0.4 mg by mouth daily after breakfast.   Yes [provider]    lidocaine-prilocaine (EMLA) cream Apply 1 application topically as needed. 12/29/17   Damon Portela, MD  prochlorperazine (COMPAZINE) 10 MG tablet Take 1 tablet (10 mg total) by mouth every 6 (six) hours as needed for nausea or vomiting. 12/29/17   Damon Portela, MD     Family History  Problem Relation Age of Onset  . Colon cancer Paternal Grandfather     Social History   Socioeconomic History  . Marital status: Married    Spouse name: Not on file  . Number of children: 2  . Years of education: Not on file  . Highest education level: Not on file  Occupational History  . Occupation: retired, [employer]    Employer: [employer]  Social Needs  . Financial resource strain: Not on file  . Food insecurity:    Worry: Not on file    Inability: Not on file  . Transportation needs:    Medical: Not on file    Non-medical: Not on file  Tobacco Use  . Smoking status: Former Smoker    Packs/day: 0.50    Years: 20.00    Pack years: 10.00    Types: Cigarettes    Last attempt to quit: 03/26/1998    Years since quitting: 19.8  . Smokeless tobacco: Never Used  Substance and Sexual Activity  . Alcohol use: Yes    Comment: occasional wine, beer  . Drug use: No  . Sexual activity: Not on file  Lifestyle  . Physical activity:    Days  per week: Not on file    Minutes per session: Not on file  . Stress: Not on file  Relationships  . Social connections:    Talks on phone: Not on file    Gets together: Not on file    Attends religious service: Not on file    Active member of club or organization: Not on file    Attends meetings of clubs or organizations: Not on file    Relationship status: Not on file  Other Topics Concern  . Not on file  Social History Narrative   Lives w/ wife     Review of Systems he denies fever, headache, chest pain, dyspnea, cough, abdominal/back pain, nausea, vomiting.  Has had some hematuria but no dysuria.  Vital Signs: BP (!) 140/91 (BP  Location: Right Arm)   Pulse 80   Temp 97.9 F (36.6 C) (Oral)   Resp 18   SpO2 96%   Physical Exam awake, alert.  Chest clear to auscultation bilaterally.  Heart with regular rate and rhythm.  Abdomen soft, positive bowel sounds, nontender.  No lower extremity edema.  Imaging: No results found.  Labs:  CBC: Recent Labs    12/11/17 0628 12/29/17 1453 01/07/18 1241  WBC  --  7.6 7.0  HGB 14.6  --  14.1  HCT  --  42.2 43.7  PLT  --  236 229    COAGS: Recent Labs    01/07/18 1241  INR 1.06    BMP: Recent Labs    12/29/17 1453  NA 139  K 4.3  CL 105  CO2 26  GLUCOSE 92  BUN 22  CALCIUM 10.0  CREATININE 1.22  GFRNONAA 57*  GFRAA >60    LIVER FUNCTION TESTS: Recent Labs    12/29/17 1453  BILITOT 1.4*  AST 17  ALT 15  ALKPHOS 70  PROT 7.4  ALBUMIN 4.2    TUMOR MARKERS: No results for input(s): AFPTM, CEA, CA199, CHROMGRNA in the last 8760 hours.  Assessment and Plan: 77 y.o. male, former smoker, with history of recently diagnosed high-grade bladder carcinoma who presents today for Port-A-Cath placement for chemotherapy prior to planned cystectomy.Risks and benefits of image guided port-a-catheter placement was discussed with the patient/spouse including, but not limited to bleeding, infection, pneumothorax, or fibrin sheath development and need for additional procedures.  All of the patient's questions were answered, patient is agreeable to proceed. Consent signed and in chart.     Thank you for this interesting consult.  I greatly enjoyed meeting [name] and look forward to participating in their care.  A copy of this report was sent to the requesting provider on this date.  Electronically Signed: D. Rowe Robert, PA-C 01/07/2018, 1:13 PM   I spent a total of 25 minutes  in face to face in clinical consultation, greater than 50% of which was counseling/coordinating care for Port-A-Cath placement

## 2018-01-13 ENCOUNTER — Other Ambulatory Visit: Payer: Self-pay | Admitting: Oncology

## 2018-01-13 ENCOUNTER — Other Ambulatory Visit: Payer: Self-pay | Admitting: Medical

## 2018-01-13 ENCOUNTER — Inpatient Hospital Stay: Payer: Medicare Other | Attending: Oncology

## 2018-01-13 ENCOUNTER — Inpatient Hospital Stay: Payer: Medicare Other

## 2018-01-13 ENCOUNTER — Other Ambulatory Visit: Payer: Medicare Other

## 2018-01-13 ENCOUNTER — Ambulatory Visit (HOSPITAL_BASED_OUTPATIENT_CLINIC_OR_DEPARTMENT_OTHER): Payer: Medicare Other | Admitting: Medical

## 2018-01-13 VITALS — BP 142/96 | HR 72 | Temp 98.3°F | Resp 18 | Wt 166.0 lb

## 2018-01-13 DIAGNOSIS — T450X5A Adverse effect of antiallergic and antiemetic drugs, initial encounter: Secondary | ICD-10-CM

## 2018-01-13 DIAGNOSIS — R05 Cough: Secondary | ICD-10-CM | POA: Diagnosis not present

## 2018-01-13 DIAGNOSIS — Z5111 Encounter for antineoplastic chemotherapy: Secondary | ICD-10-CM | POA: Insufficient documentation

## 2018-01-13 DIAGNOSIS — L539 Erythematous condition, unspecified: Secondary | ICD-10-CM | POA: Diagnosis not present

## 2018-01-13 DIAGNOSIS — C678 Malignant neoplasm of overlapping sites of bladder: Secondary | ICD-10-CM

## 2018-01-13 DIAGNOSIS — C679 Malignant neoplasm of bladder, unspecified: Secondary | ICD-10-CM | POA: Diagnosis not present

## 2018-01-13 LAB — CMP (CANCER CENTER ONLY)
ALBUMIN: 4.1 g/dL (ref 3.5–5.0)
ALK PHOS: 67 U/L (ref 40–150)
ALT: 19 U/L (ref 0–55)
ANION GAP: 10 (ref 3–11)
AST: 22 U/L (ref 5–34)
BUN: 22 mg/dL (ref 7–26)
CALCIUM: 10.1 mg/dL (ref 8.4–10.4)
CHLORIDE: 106 mmol/L (ref 98–109)
CO2: 23 mmol/L (ref 22–29)
Creatinine: 1.17 mg/dL (ref 0.70–1.30)
GFR, Estimated: 60 mL/min — ABNORMAL LOW (ref >60)
GLUCOSE: 89 mg/dL (ref 70–140)
Potassium: 4.6 mmol/L (ref 3.5–5.1)
Sodium: 139 mmol/L (ref 136–145)
Total Bilirubin: 0.8 mg/dL (ref 0.2–1.2)
Total Protein: 7.2 g/dL (ref 6.4–8.3)

## 2018-01-13 MED ORDER — DEXAMETHASONE 4 MG PO TABS: 4.0000 mg | ORAL_TABLET | Freq: Two times a day (BID) | ORAL | 0 refills | Status: DC

## 2018-01-13 MED ORDER — SODIUM CHLORIDE 0.9% FLUSH
10.0000 mL | INTRAVENOUS | Status: DC | PRN
  Filled 2018-01-13: qty 10

## 2018-01-13 MED ORDER — PALONOSETRON HCL INJECTION 0.25 MG/5ML
0.2500 mg | Freq: Once | INTRAVENOUS | Status: AC
  Administered 2018-01-13: 0.25 mg via INTRAVENOUS

## 2018-01-13 MED ORDER — PALONOSETRON HCL INJECTION 0.25 MG/5ML
INTRAVENOUS | Status: AC
  Filled 2018-01-13: qty 5

## 2018-01-13 MED ORDER — SODIUM CHLORIDE 0.9 % IV SOLN
70.0000 mg/m2 | Freq: Once | INTRAVENOUS | Status: AC
  Administered 2018-01-13: 133 mg via INTRAVENOUS
  Filled 2018-01-13: qty 133

## 2018-01-13 MED ORDER — POTASSIUM CHLORIDE 2 MEQ/ML IV SOLN
Freq: Once | INTRAVENOUS | Status: AC
  Administered 2018-01-13: 10:00:00 via INTRAVENOUS
  Filled 2018-01-13: qty 10

## 2018-01-13 MED ORDER — SODIUM CHLORIDE 0.9 % IV SOLN
Freq: Once | INTRAVENOUS | Status: AC
  Administered 2018-01-13: 08:00:00 via INTRAVENOUS

## 2018-01-13 MED ORDER — SODIUM CHLORIDE 0.9 % IV SOLN
Freq: Once | INTRAVENOUS | Status: AC
  Administered 2018-01-13: 12:00:00 via INTRAVENOUS
  Filled 2018-01-13: qty 5

## 2018-01-13 MED ORDER — HEPARIN SOD (PORK) LOCK FLUSH 100 UNIT/ML IV SOLN
500.0000 [IU] | Freq: Once | INTRAVENOUS | Status: AC | PRN
  Administered 2018-01-13: 500 [IU]
  Filled 2018-01-13: qty 5

## 2018-01-13 MED ORDER — METHYLPREDNISOLONE SODIUM SUCC 125 MG IJ SOLR
125.0000 mg | Freq: Once | INTRAMUSCULAR | Status: AC
  Administered 2018-01-13: 125 mg via INTRAVENOUS

## 2018-01-13 MED ORDER — SODIUM CHLORIDE 0.9 % IV SOLN: 100.0000 mg | Freq: Once | INTRAVENOUS | Status: DC

## 2018-01-13 MED ORDER — DIPHENHYDRAMINE HCL 50 MG/ML IJ SOLN
50.0000 mg | Freq: Once | INTRAMUSCULAR | Status: AC
  Administered 2018-01-13: 50 mg via INTRAVENOUS

## 2018-01-13 MED ORDER — SODIUM CHLORIDE 0.9 % IV SOLN
1000.0000 mg/m2 | Freq: Once | INTRAVENOUS | Status: AC
  Administered 2018-01-13: 1900 mg via INTRAVENOUS
  Filled 2018-01-13: qty 50

## 2018-01-13 NOTE — Progress Notes (Signed)
1215pm RN informed that patient experienced reaction including flushing and shortness of breath, while receiving Emend. Per Cloyde Reams, RN reaction developed approx 5-7 minutes into infusion. Hypersensitivity protocol initiated. Lucianne Lei PA to chairside to assess patient. Treatment resumed upon relief of symptoms.   Discharge instructions printed and verbally reviewed. Questions answered to spouse and patient's satisfaction. No further questions or concerns at this time.

## 2018-01-13 NOTE — Patient Instructions (Addendum)
[facility] Discharge Instructions for Patients Receiving Chemotherapy  Today you received the following chemotherapy agents Gemzar; Cisplatin  To help prevent nausea and vomiting after your treatment, we encourage you to take your nausea medication as directed   If you develop nausea and vomiting that is not controlled by your nausea medication, call the clinic.   BELOW ARE SYMPTOMS THAT SHOULD BE REPORTED IMMEDIATELY:  *FEVER GREATER THAN 100.5 F  *CHILLS WITH OR WITHOUT FEVER  NAUSEA AND VOMITING THAT IS NOT CONTROLLED WITH YOUR NAUSEA MEDICATION  *UNUSUAL SHORTNESS OF BREATH  *UNUSUAL BRUISING OR BLEEDING  TENDERNESS IN MOUTH AND THROAT WITH OR WITHOUT PRESENCE OF ULCERS  *URINARY PROBLEMS  *BOWEL PROBLEMS  UNUSUAL RASH Items with * indicate a potential emergency and should be followed up as soon as possible.  Feel free to call the clinic should you have any questions or concerns. The clinic phone number is [phone].  Please show the [facility] at check-in to the Emergency Department and triage nurse.  Gemcitabine injection What is this medicine? GEMCITABINE (jem SIT a been) is a chemotherapy drug. This medicine is used to treat many types of cancer like breast cancer, lung cancer, pancreatic cancer, and ovarian cancer. This medicine may be used for other purposes; ask your health care provider or pharmacist if you have questions. COMMON BRAND NAME(S): Gemzar What should I tell my health care provider before I take this medicine? They need to know if you have any of these conditions: -blood disorders -infection -kidney disease -liver disease -recent or ongoing radiation therapy -an unusual or allergic reaction to gemcitabine, other chemotherapy, other medicines, foods, dyes, or preservatives -pregnant or trying to get pregnant -breast-feeding How should I use this medicine? This drug is given as an infusion into a vein. It is  administered in a hospital or clinic by a specially trained health care professional. Talk to your pediatrician regarding the use of this medicine in children. Special care may be needed. Overdosage: If you think you have taken too much of this medicine contact a poison control center or emergency room at once. NOTE: This medicine is only for you. Do not share this medicine with others. What if I miss a dose? It is important not to miss your dose. Call your doctor or health care professional if you are unable to keep an appointment. What may interact with this medicine? -medicines to increase blood counts like filgrastim, pegfilgrastim, sargramostim -some other chemotherapy drugs like cisplatin -vaccines Talk to your doctor or health care professional before taking any of these medicines: -acetaminophen -aspirin -ibuprofen -ketoprofen -naproxen This list may not describe all possible interactions. Give your health care provider a list of all the medicines, herbs, non-prescription drugs, or dietary supplements you use. Also tell them if you smoke, drink alcohol, or use illegal drugs. Some items may interact with your medicine. What should I watch for while using this medicine? Visit your doctor for checks on your progress. This drug may make you feel generally unwell. This is not uncommon, as chemotherapy can affect healthy cells as well as cancer cells. Report any side effects. Continue your course of treatment even though you feel ill unless your doctor tells you to stop. In some cases, you may be given additional medicines to help with side effects. Follow all directions for their use. Call your doctor or health care professional for advice if you get a fever, chills or sore throat, or other symptoms of a cold  or flu. Do not treat yourself. This drug decreases your body's ability to fight infections. Try to avoid being around people who are sick. This medicine may increase your risk to bruise  or bleed. Call your doctor or health care professional if you notice any unusual bleeding. Be careful brushing and flossing your teeth or using a toothpick because you may get an infection or bleed more easily. If you have any dental work done, tell your dentist you are receiving this medicine. Avoid taking products that contain aspirin, acetaminophen, ibuprofen, naproxen, or ketoprofen unless instructed by your doctor. These medicines may hide a fever. Women should inform their doctor if they wish to become pregnant or think they might be pregnant. There is a potential for serious side effects to an unborn child. Talk to your health care professional or pharmacist for more information. Do not breast-feed an infant while taking this medicine. What side effects may I notice from receiving this medicine? Side effects that you should report to your doctor or health care professional as soon as possible: -allergic reactions like skin rash, itching or hives, swelling of the face, lips, or tongue -low blood counts - this medicine may decrease the number of white blood cells, red blood cells and platelets. You may be at increased risk for infections and bleeding. -signs of infection - fever or chills, cough, sore throat, pain or difficulty passing urine -signs of decreased platelets or bleeding - bruising, pinpoint red spots on the skin, black, tarry stools, blood in the urine -signs of decreased red blood cells - unusually weak or tired, fainting spells, lightheadedness -breathing problems -chest pain -mouth sores -nausea and vomiting -pain, swelling, redness at site where injected -pain, tingling, numbness in the hands or feet -stomach pain -swelling of ankles, feet, hands -unusual bleeding Side effects that usually do not require medical attention (report to your doctor or health care professional if they continue or are bothersome): -constipation -diarrhea -hair loss -loss of appetite -stomach  upset This list may not describe all possible side effects. Call your doctor for medical advice about side effects. You may report side effects to FDA at 1-800-FDA-1088. Where should I keep my medicine? This drug is given in a hospital or clinic and will not be stored at home. NOTE: This sheet is a summary. It may not cover all possible information. If you have questions about this medicine, talk to your doctor, pharmacist, or health care provider.  2018 [employer]/Gold Standard (2008-02-08 18:45:54)  Cisplatin injection What is this medicine? CISPLATIN (SIS pla tin) is a chemotherapy drug. It targets fast dividing cells, like cancer cells, and causes these cells to die. This medicine is used to treat many types of cancer like bladder, ovarian, and testicular cancers. This medicine may be used for other purposes; ask your health care provider or pharmacist if you have questions. COMMON BRAND NAME(S): Platinol, Platinol -AQ What should I tell my health care provider before I take this medicine? They need to know if you have any of these conditions: -blood disorders -hearing problems -kidney disease -recent or ongoing radiation therapy -an unusual or allergic reaction to cisplatin, carboplatin, other chemotherapy, other medicines, foods, dyes, or preservatives -pregnant or trying to get pregnant -breast-feeding How should I use this medicine? This drug is given as an infusion into a vein. It is administered in a hospital or clinic by a specially trained health care professional. Talk to your pediatrician regarding the use of this medicine in children. Special care may  be needed. Overdosage: If you think you have taken too much of this medicine contact a poison control center or emergency room at once. NOTE: This medicine is only for you. Do not share this medicine with others. What if I miss a dose? It is important not to miss a dose. Call your doctor or health care professional if you are  unable to keep an appointment. What may interact with this medicine? -dofetilide -foscarnet -medicines for seizures -medicines to increase blood counts like filgrastim, pegfilgrastim, sargramostim -probenecid -pyridoxine used with altretamine -rituximab -some antibiotics like amikacin, gentamicin, neomycin, polymyxin B, streptomycin, tobramycin -sulfinpyrazone -vaccines -zalcitabine Talk to your doctor or health care professional before taking any of these medicines: -acetaminophen -aspirin -ibuprofen -ketoprofen -naproxen This list may not describe all possible interactions. Give your health care provider a list of all the medicines, herbs, non-prescription drugs, or dietary supplements you use. Also tell them if you smoke, drink alcohol, or use illegal drugs. Some items may interact with your medicine. What should I watch for while using this medicine? Your condition will be monitored carefully while you are receiving this medicine. You will need important blood work done while you are taking this medicine. This drug may make you feel generally unwell. This is not uncommon, as chemotherapy can affect healthy cells as well as cancer cells. Report any side effects. Continue your course of treatment even though you feel ill unless your doctor tells you to stop. In some cases, you may be given additional medicines to help with side effects. Follow all directions for their use. Call your doctor or health care professional for advice if you get a fever, chills or sore throat, or other symptoms of a cold or flu. Do not treat yourself. This drug decreases your body's ability to fight infections. Try to avoid being around people who are sick. This medicine may increase your risk to bruise or bleed. Call your doctor or health care professional if you notice any unusual bleeding. Be careful brushing and flossing your teeth or using a toothpick because you may get an infection or bleed more easily.  If you have any dental work done, tell your dentist you are receiving this medicine. Avoid taking products that contain aspirin, acetaminophen, ibuprofen, naproxen, or ketoprofen unless instructed by your doctor. These medicines may hide a fever. Do not become pregnant while taking this medicine. Women should inform their doctor if they wish to become pregnant or think they might be pregnant. There is a potential for serious side effects to an unborn child. Talk to your health care professional or pharmacist for more information. Do not breast-feed an infant while taking this medicine. Drink fluids as directed while you are taking this medicine. This will help protect your kidneys. Call your doctor or health care professional if you get diarrhea. Do not treat yourself. What side effects may I notice from receiving this medicine? Side effects that you should report to your doctor or health care professional as soon as possible: -allergic reactions like skin rash, itching or hives, swelling of the face, lips, or tongue -signs of infection - fever or chills, cough, sore throat, pain or difficulty passing urine -signs of decreased platelets or bleeding - bruising, pinpoint red spots on the skin, black, tarry stools, nosebleeds -signs of decreased red blood cells - unusually weak or tired, fainting spells, lightheadedness -breathing problems -changes in hearing -gout pain -low blood counts - This drug may decrease the number of white blood cells, red blood  cells and platelets. You may be at increased risk for infections and bleeding. -nausea and vomiting -pain, swelling, redness or irritation at the injection site -pain, tingling, numbness in the hands or feet -problems with balance, movement -trouble passing urine or change in the amount of urine Side effects that usually do not require medical attention (report to your doctor or health care professional if they continue or are bothersome): -changes  in vision -loss of appetite -metallic taste in the mouth or changes in taste This list may not describe all possible side effects. Call your doctor for medical advice about side effects. You may report side effects to FDA at 1-800-FDA-1088. Where should I keep my medicine? This drug is given in a hospital or clinic and will not be stored at home. NOTE: This sheet is a summary. It may not cover all possible information. If you have questions about this medicine, talk to your doctor, pharmacist, or health care provider.  2018 Elsevier/Gold Standard (2008-01-04 14:40:54)

## 2018-01-14 ENCOUNTER — Telehealth: Payer: Self-pay | Admitting: *Deleted

## 2018-01-14 NOTE — Telephone Encounter (Signed)
Followed up with patient regarding first chemotherapy. Patient stated,"I slept all night long. No problems here except the reaction to the Emend. That was scary."

## 2018-01-14 NOTE — Telephone Encounter (Signed)
-----   Message from Smith Mince, RN sent at 01/13/2018  3:31 PM EDT ----- Regarding: Alen Blew: First time chemo Patient tolerated chemo well; however,reaction to Emend noted with flushing and SOB.

## 2018-01-15 NOTE — Progress Notes (Signed)
[facility] Progress Note   [name] [id number] October 17, 1940 77 y.o.  [name] is managed by Dr. Oneita Jolly presents for:  Chemotherapy:  yes       Monoclonal Antibody: no      Immunoglobulin: no      Bisphosphonate: no       Transfusion:  no     Current Therapy: Cisplatin and gemcitabine  [name] was receiving Emend at the time of his reaction.  First dose of Emend: yes   Assessment: Plan:    Infusion reaction, initial encounter  [name] was seen in the infusion room for a suspected infusion reaction. He was receiving Emend at the time of his reaction. He had just begun receiving Emend prior to onset of symptoms. His symptoms included: Cough and.  Erythema of the face and neck He had received Aloxi prior to beginning Emend. Emend was stopped was paused and [name] was given Benadryl 50 mg IV after onset of his symptoms. [name] did  respond to intervention.   This case was discussed with Dr. Alen Blew, who directed that the following be done: Emend will be eliminated from the patient's chemotherapy.  The patient was given a prescription for dexamethasone 4 mg p.o. twice daily with instructions to take this the day before, day of and day after chemotherapy.  Please see After Visit Summary for patient specific instructions.  Future Appointments  Date Time Provider [place]  01/20/2018  8:00 AM [facility] INJ NURSE [facility] None  01/20/2018  8:00 AM [facility] LAB 2 [facility] None  01/20/2018  8:30 AM Wyatt Portela, MD [facility] None  01/20/2018  9:15 AM [facility] E14 [facility] None  02/03/2018  8:45 AM [facility] LAB 2 [facility] None  02/03/2018  9:00 AM [facility] INJ NURSE [facility] None  02/03/2018  9:30 AM Curcio, Roselie Awkward, NP [facility] None  02/03/2018 10:15 AM [facility] B4 [facility] None  02/10/2018  9:30 AM [facility] LAB 3 [facility] None  02/10/2018 10:00 AM [facility]  FLUSH NURSE [facility] None  02/10/2018 10:30 AM Shadad, Mathis Dad, MD [facility] None  02/10/2018 11:30 AM [facility] F20 [facility] None    No orders of the defined types were placed in this encounter.      Subjective:   Patient ID:  [name] is a 77 y.o. (DOB 22-Feb-1941) male.  Chief Complaint: No chief complaint on file.   HPI [name] was seen in the infusion room for a suspected infusion reaction. He was receiving Emend at the time of his reaction. He had just begun receiving Emend prior to onset of symptoms. His symptoms included: Cough and.  Erythema of the face and neck. He had received Aloxi prior to beginning Emend. Emend was stopped was paused and [name] was given Benadryl 50 mg IV after onset of his symptoms. [name] did  respond to intervention.  This case was discussed with Dr. Alen Blew, who directed that the following be done: Emend will be eliminated from the patient's chemotherapy.  The patient was given a prescription for dexamethasone 4 mg p.o. twice daily with instructions to take this the day before, day of and day after chemotherapy.  Medications: I have reviewed the patient's current medications.  Allergies: No Known Allergies  Past Medical History:  Diagnosis Date  . Bladder cancer [facility])    urologist-  dr Tresa Moore  . BPH (benign prostatic hyperplasia)   . Full  dentures   . GERD (gastroesophageal reflux disease)   . Hiatal hernia   . History of bladder stone   . History of kidney stones    per pt hx passed stones spontenaously  . Hypercholesterolemia   . Wears glasses   . Wears hearing aid in both ears     Past Surgical History:  Procedure Laterality Date  . COLONOSCOPY WITH ESOPHAGOGASTRODUODENOSCOPY (EGD)  04-08-2012  dr [name]  . CYSTO/  BLADDER STONE EXTRACTIONS  1998 approx.  . IR FLUORO GUIDE PORT INSERTION RIGHT  01/07/2018  . IR US GUIDE VASC ACCESS RIGHT  01/07/2018  . TRANSURETHRAL RESECTION OF BLADDER TUMOR N/A 12/11/2017    Procedure: CYSTOSCOPY WITH BILATERAL RETROGRADE PYELOGRAM, TRANSURETHRAL RESECTION OF BLADDER TUMOR (TURBT);  Surgeon: Alexis Frock, MD;  Location: [facility];  Service: Urology;  Laterality: N/A;  . UMBILICAL HERNIA REPAIR  2008  . VASECTOMY  1980s    Family History  Problem Relation Age of Onset  . Colon cancer Paternal Grandfather     Social History   Socioeconomic History  . Marital status: Married    Spouse name: Not on file  . Number of children: 2  . Years of education: Not on file  . Highest education level: Not on file  Occupational History  . Occupation: retired, [employer]    Employer: [employer]  Social Needs  . Financial resource strain: Not on file  . Food insecurity:    Worry: Not on file    Inability: Not on file  . Transportation needs:    Medical: Not on file    Non-medical: Not on file  Tobacco Use  . Smoking status: Former Smoker    Packs/day: 0.50    Years: 20.00    Pack years: 10.00    Types: Cigarettes    Last attempt to quit: 03/26/1998    Years since quitting: 19.8  . Smokeless tobacco: Never Used  Substance and Sexual Activity  . Alcohol use: Yes    Comment: occasional wine, beer  . Drug use: No  . Sexual activity: Not on file  Lifestyle  . Physical activity:    Days per week: Not on file    Minutes per session: Not on file  . Stress: Not on file  Relationships  . Social connections:    Talks on phone: Not on file    Gets together: Not on file    Attends religious service: Not on file    Active member of club or organization: Not on file    Attends meetings of clubs or organizations: Not on file    Relationship status: Not on file  . Intimate partner violence:    Fear of current or ex partner: Not on file    Emotionally abused: Not on file    Physically abused: Not on file    Forced sexual activity: Not on file  Other Topics Concern  . Not on file  Social History Narrative   Lives w/ wife     Past Medical History, Surgical history, Social history, and Family history were reviewed and updated as appropriate.   Please see review of systems for further details on the patient's review from today.   Review of Systems:  Review of Systems  Constitutional: Negative for chills and diaphoresis.  HENT: Negative for congestion, facial swelling and trouble swallowing.   Respiratory: Positive for cough. Negative for choking, chest tightness, shortness of breath and wheezing.   Cardiovascular: Negative for  chest pain and palpitations.  Gastrointestinal: Negative for nausea and vomiting.  Musculoskeletal: Negative for back pain.  Skin: Positive for color change (Erythema of the face and neck.). Negative for rash.  Neurological: Negative for dizziness, speech difficulty and headaches.  Psychiatric/Behavioral: The patient is not nervous/anxious.     Objective:   Physical Exam:  There were no vitals taken for this visit.  Physical Exam  Constitutional: No distress.  HENT:  Head: Normocephalic and atraumatic.  Cardiovascular: Normal rate, regular rhythm and normal heart sounds. Exam reveals no gallop and no friction rub.  No murmur heard. Pulmonary/Chest: Effort normal and breath sounds normal. No respiratory distress. He has no wheezes. He has no rales.  Neurological: He is alert.  Skin: Skin is warm and dry. No rash noted. He is not diaphoretic. There is erythema (Erythema of the face and neck which resolved after IV Benadryl was given.).    Lab Review:     Component Value Date/Time   NA 139 01/13/2018 0815   K 4.6 01/13/2018 0815   CL 106 01/13/2018 0815   CO2 23 01/13/2018 0815   GLUCOSE 89 01/13/2018 0815   BUN 22 01/13/2018 0815   CREATININE 1.17 01/13/2018 0815   CREATININE 0.95 08/01/2011 1140   CALCIUM 10.1 01/13/2018 0815   PROT 7.2 01/13/2018 0815   ALBUMIN 4.1 01/13/2018 0815   AST 22 01/13/2018 0815   ALT 19 01/13/2018 0815   ALKPHOS 67 01/13/2018 0815    BILITOT 0.8 01/13/2018 0815   GFRNONAA 60 (L) 01/13/2018 0815   GFRAA >60 01/13/2018 0815       Component Value Date/Time   WBC 7.0 01/07/2018 1241   RBC 4.78 01/07/2018 1241   HGB 14.1 01/07/2018 1241   HCT 43.7 01/07/2018 1241   PLT 229 01/07/2018 1241   PLT 236 12/29/2017 1453   MCV 91.4 01/07/2018 1241   MCH 29.5 01/07/2018 1241   MCHC 32.3 01/07/2018 1241   RDW 13.0 01/07/2018 1241   LYMPHSABS 1.4 01/07/2018 1241   MONOABS 0.5 01/07/2018 1241   EOSABS 0.1 01/07/2018 1241   BASOSABS 0.0 01/07/2018 1241   -------------------------------  Imaging from last 24 hours (if applicable):  Radiology interpretation: Ir US Guide Vasc Access Right  Result Date: 01/07/2018 CLINICAL DATA:  Bladder carcinoma and need for porta cath to begin chemotherapy. EXAM: IMPLANTED PORT A CATH PLACEMENT WITH ULTRASOUND AND FLUOROSCOPIC GUIDANCE ANESTHESIA/SEDATION: 2.5 mg IV Versed; 150 mcg IV Fentanyl Total Moderate Sedation Time:  31 minutes The patient's level of consciousness and physiologic status were continuously monitored during the procedure by Radiology nursing. Additional Medications: 2 g IV Ancef. FLUOROSCOPY TIME:  24 seconds.  3.8 mGy. PROCEDURE: The procedure, risks, benefits, and alternatives were explained to the patient. Questions regarding the procedure were encouraged and answered. The patient understands and consents to the procedure. A time-out was performed prior to initiating the procedure. Ultrasound was utilized to confirm patency of the right internal jugular vein. The right neck and chest were prepped with chlorhexidine in a sterile fashion, and a sterile drape was applied covering the operative field. Maximum barrier sterile technique with sterile gowns and gloves were used for the procedure. Local anesthesia was provided with 1% lidocaine. After creating a small venotomy incision, a 21 gauge needle was advanced into the right internal jugular vein under direct, real-time  ultrasound guidance. Ultrasound image documentation was performed. After securing guidewire access, an 8 Fr dilator was placed. A J-wire was kinked to measure appropriate catheter length.  A subcutaneous port pocket was then created along the upper chest wall utilizing sharp and blunt dissection. Portable cautery was utilized. The pocket was irrigated with sterile saline. A single lumen power injectable port was chosen for placement. The 8 Fr catheter was tunneled from the port pocket site to the venotomy incision. The port was placed in the pocket. External catheter was trimmed to appropriate length based on guidewire measurement. At the venotomy, an 8 Fr peel-away sheath was placed over a guidewire. The catheter was then placed through the sheath and the sheath removed. Final catheter positioning was confirmed and documented with a fluoroscopic spot image. The port was accessed with a needle and aspirated and flushed with heparinized saline. The access needle was removed. The venotomy and port pocket incisions were closed with subcutaneous 3-0 Monocryl and subcuticular 4-0 Vicryl. Dermabond was applied to both incisions. COMPLICATIONS: COMPLICATIONS None FINDINGS: After catheter placement, the tip lies at the cavo-atrial junction. The catheter aspirates normally and is ready for immediate use. IMPRESSION: Placement of single lumen port a cath via right internal jugular vein. The catheter tip lies at the cavo-atrial junction. A power injectable port a cath was placed and is ready for immediate use. Electronically Signed   By: Aletta Edouard M.D.   On: 01/07/2018 16:59   Ir Fluoro Guide Port Insertion Right  Result Date: 01/07/2018 CLINICAL DATA:  Bladder carcinoma and need for porta cath to begin chemotherapy. EXAM: IMPLANTED PORT A CATH PLACEMENT WITH ULTRASOUND AND FLUOROSCOPIC GUIDANCE ANESTHESIA/SEDATION: 2.5 mg IV Versed; 150 mcg IV Fentanyl Total Moderate Sedation Time:  31 minutes The patient's level of  consciousness and physiologic status were continuously monitored during the procedure by Radiology nursing. Additional Medications: 2 g IV Ancef. FLUOROSCOPY TIME:  24 seconds.  3.8 mGy. PROCEDURE: The procedure, risks, benefits, and alternatives were explained to the patient. Questions regarding the procedure were encouraged and answered. The patient understands and consents to the procedure. A time-out was performed prior to initiating the procedure. Ultrasound was utilized to confirm patency of the right internal jugular vein. The right neck and chest were prepped with chlorhexidine in a sterile fashion, and a sterile drape was applied covering the operative field. Maximum barrier sterile technique with sterile gowns and gloves were used for the procedure. Local anesthesia was provided with 1% lidocaine. After creating a small venotomy incision, a 21 gauge needle was advanced into the right internal jugular vein under direct, real-time ultrasound guidance. Ultrasound image documentation was performed. After securing guidewire access, an 8 Fr dilator was placed. A J-wire was kinked to measure appropriate catheter length. A subcutaneous port pocket was then created along the upper chest wall utilizing sharp and blunt dissection. Portable cautery was utilized. The pocket was irrigated with sterile saline. A single lumen power injectable port was chosen for placement. The 8 Fr catheter was tunneled from the port pocket site to the venotomy incision. The port was placed in the pocket. External catheter was trimmed to appropriate length based on guidewire measurement. At the venotomy, an 8 Fr peel-away sheath was placed over a guidewire. The catheter was then placed through the sheath and the sheath removed. Final catheter positioning was confirmed and documented with a fluoroscopic spot image. The port was accessed with a needle and aspirated and flushed with heparinized saline. The access needle was removed. The  venotomy and port pocket incisions were closed with subcutaneous 3-0 Monocryl and subcuticular 4-0 Vicryl. Dermabond was applied to both incisions. COMPLICATIONS: COMPLICATIONS None FINDINGS:  After catheter placement, the tip lies at the cavo-atrial junction. The catheter aspirates normally and is ready for immediate use. IMPRESSION: Placement of single lumen port a cath via right internal jugular vein. The catheter tip lies at the cavo-atrial junction. A power injectable port a cath was placed and is ready for immediate use. Electronically Signed   By: Aletta Edouard M.D.   On: 01/07/2018 16:59        This case was discussed Dr. Alen Blew. He expressed agreement with my management of this patient.

## 2018-01-20 ENCOUNTER — Inpatient Hospital Stay: Payer: Medicare Other

## 2018-01-20 ENCOUNTER — Inpatient Hospital Stay (HOSPITAL_BASED_OUTPATIENT_CLINIC_OR_DEPARTMENT_OTHER): Payer: Medicare Other | Admitting: Oncology

## 2018-01-20 VITALS — BP 150/87 | HR 80 | Temp 98.2°F | Resp 20 | Ht 68.0 in | Wt 165.2 lb

## 2018-01-20 DIAGNOSIS — C679 Malignant neoplasm of bladder, unspecified: Secondary | ICD-10-CM

## 2018-01-20 DIAGNOSIS — Z95828 Presence of other vascular implants and grafts: Secondary | ICD-10-CM

## 2018-01-20 DIAGNOSIS — C678 Malignant neoplasm of overlapping sites of bladder: Secondary | ICD-10-CM

## 2018-01-20 DIAGNOSIS — Z5111 Encounter for antineoplastic chemotherapy: Secondary | ICD-10-CM | POA: Diagnosis not present

## 2018-01-20 DIAGNOSIS — T8090XA Unspecified complication following infusion and therapeutic injection, initial encounter: Secondary | ICD-10-CM

## 2018-01-20 LAB — CBC WITH DIFFERENTIAL (CANCER CENTER ONLY)
Basophils Absolute: 0 10*3/uL (ref 0.0–0.1)
Basophils Relative: 0 %
Eosinophils Absolute: 0 10*3/uL (ref 0.0–0.5)
Eosinophils Relative: 0 %
HEMATOCRIT: 40.6 % (ref 38.4–49.9)
Hemoglobin: 13.8 g/dL (ref 13.0–17.1)
LYMPHS ABS: 0.7 10*3/uL — AB (ref 0.9–3.3)
LYMPHS PCT: 9 %
MCH: 30.2 pg (ref 27.2–33.4)
MCHC: 34 g/dL (ref 32.0–36.0)
MCV: 88.8 fL (ref 79.3–98.0)
MONO ABS: 0 10*3/uL — AB (ref 0.1–0.9)
Monocytes Relative: 0 %
NEUTROS ABS: 7.2 10*3/uL — AB (ref 1.5–6.5)
Neutrophils Relative %: 91 %
Platelet Count: 143 10*3/uL (ref 140–400)
RBC: 4.57 MIL/uL (ref 4.20–5.82)
RDW: 12.5 % (ref 11.0–14.6)
WBC Count: 7.9 10*3/uL (ref 4.0–10.3)

## 2018-01-20 LAB — CMP (CANCER CENTER ONLY)
ALBUMIN: 4.1 g/dL (ref 3.5–5.0)
ALT: 49 U/L (ref 0–55)
ANION GAP: 10 (ref 3–11)
AST: 23 U/L (ref 5–34)
Alkaline Phosphatase: 72 U/L (ref 40–150)
BUN: 27 mg/dL — ABNORMAL HIGH (ref 7–26)
CO2: 22 mmol/L (ref 22–29)
Calcium: 9.9 mg/dL (ref 8.4–10.4)
Chloride: 102 mmol/L (ref 98–109)
Creatinine: 1.18 mg/dL (ref 0.70–1.30)
GFR, Est AFR Am: >60 mL/min (ref >60)
GFR, Estimated: 59 mL/min — ABNORMAL LOW (ref >60)
GLUCOSE: 150 mg/dL — AB (ref 70–140)
POTASSIUM: 4.3 mmol/L (ref 3.5–5.1)
Sodium: 134 mmol/L — ABNORMAL LOW (ref 136–145)
Total Bilirubin: 0.9 mg/dL (ref 0.2–1.2)
Total Protein: 7.2 g/dL (ref 6.4–8.3)

## 2018-01-20 MED ORDER — SODIUM CHLORIDE 0.9% FLUSH
10.0000 mL | INTRAVENOUS | Status: DC | PRN
  Administered 2018-01-20: 10 mL via INTRAVENOUS
  Filled 2018-01-20: qty 10

## 2018-01-20 MED ORDER — SODIUM CHLORIDE 0.9% FLUSH
10.0000 mL | INTRAVENOUS | Status: DC | PRN
  Administered 2018-01-20: 10 mL
  Filled 2018-01-20: qty 10

## 2018-01-20 MED ORDER — PROCHLORPERAZINE MALEATE 10 MG PO TABS
ORAL_TABLET | ORAL | Status: AC
  Filled 2018-01-20: qty 1

## 2018-01-20 MED ORDER — SODIUM CHLORIDE 0.9 % IV SOLN
Freq: Once | INTRAVENOUS | Status: AC
  Administered 2018-01-20: 10:00:00 via INTRAVENOUS

## 2018-01-20 MED ORDER — GEMCITABINE HCL CHEMO INJECTION 1 GM/26.3ML
1000.0000 mg/m2 | Freq: Once | INTRAVENOUS | Status: AC
  Administered 2018-01-20: 1900 mg via INTRAVENOUS
  Filled 2018-01-20: qty 49.97

## 2018-01-20 MED ORDER — PROCHLORPERAZINE MALEATE 10 MG PO TABS
10.0000 mg | ORAL_TABLET | Freq: Once | ORAL | Status: AC
  Administered 2018-01-20: 10 mg via ORAL

## 2018-01-20 MED ORDER — HEPARIN SOD (PORK) LOCK FLUSH 100 UNIT/ML IV SOLN
500.0000 [IU] | Freq: Once | INTRAVENOUS | Status: AC | PRN
  Administered 2018-01-20: 500 [IU]
  Filled 2018-01-20: qty 5

## 2018-01-20 NOTE — Patient Instructions (Signed)
[facility] Discharge Instructions for Patients Receiving Chemotherapy  Today you received the following chemotherapy agents:  Gemzar (gemcitibine)  To help prevent nausea and vomiting after your treatment, we encourage you to take your nausea medication as prescribed.   If you develop nausea and vomiting that is not controlled by your nausea medication, call the clinic.   BELOW ARE SYMPTOMS THAT SHOULD BE REPORTED IMMEDIATELY:  *FEVER GREATER THAN 100.5 F  *CHILLS WITH OR WITHOUT FEVER  NAUSEA AND VOMITING THAT IS NOT CONTROLLED WITH YOUR NAUSEA MEDICATION  *UNUSUAL SHORTNESS OF BREATH  *UNUSUAL BRUISING OR BLEEDING  TENDERNESS IN MOUTH AND THROAT WITH OR WITHOUT PRESENCE OF ULCERS  *URINARY PROBLEMS  *BOWEL PROBLEMS  UNUSUAL RASH Items with * indicate a potential emergency and should be followed up as soon as possible.  Feel free to call the clinic should you have any questions or concerns. The clinic phone number is [phone].  Please show the CHEMO ALERT CARD at check-in to the Emergency Department and triage nurse.   

## 2018-01-20 NOTE — Progress Notes (Signed)
01/20/18 @ 1015  Received written in-basket 01/19/18 to discontinue Emend IV due to infusion reaction with cycle #1.  Give Dexamethasone 10 mg IV prior to Day 1 chemotherapy treatments.  Treatment plan modified per MD request.  W.O. Dr Chrystine Oiler, PharmD   Henreitta Leber, PharmD

## 2018-01-20 NOTE — Progress Notes (Signed)
Hematology and Oncology Follow Up Visit  [name] [id number] 1940/10/27 77 y.o. 01/20/2018 8:28 AM [name], MDHall, Damon Areola, MD   Principle Diagnosis: 78 year old man with high-grade urothelial carcinoma of the bladder diagnosed on December 11, 2017.  He was found to have T2N0 disease.  No lymphadenopathy or metastatic disease noted.   Prior Therapy:  He is status post TURBT on December 11, 2017.  The final pathology showed high-grade invasive urothelial carcinoma with involvement of the muscularis propria at the base of the bladder.   Current therapy: Neoadjuvant chemotherapy utilizing cisplatin and gemcitabine.  Day 1 of cycle 1 was 02/09/2018.  He is here for day 8 of cycle 1.  Interim History: [name] presents today for a follow-up visit.  Since her last visit, he started the first cycle of chemotherapy.  He tolerated chemotherapy reasonably well although he did develop an infusion related reaction to Emend.  He denied any nausea, vomiting or excessive fatigue or tiredness.  He denies any infusion related complications.  He is urinating freely without any hematuria or dysuria.  He denies any worsening neuropathy.  His Port-A-Cath has been inserted without complications including pain or discomfort.  He does not report any headaches, blurry vision, syncope or seizures. Does not report any fevers, chills or sweats.  Does not report any cough, wheezing or hemoptysis.  Does not report any chest pain, palpitation, orthopnea or leg edema.  Does not report any nausea, vomiting or abdominal pain.  Does not report any constipation or diarrhea.  Does not report any skeletal complaints.    Does not report frequency, urgency or hematuria.  Does not report any skin rashes or lesions. Does not report any heat or cold intolerance.  Does not report any lymphadenopathy or petechiae.  Does not report any anxiety or depression.  Remaining review of systems is negative.    Medications: I have reviewed the  patient's current medications.  Current Outpatient Medications  Medication Sig Dispense Refill  . dexamethasone (DECADRON) 4 MG tablet Take 1 tablet (4 mg total) by mouth 2 (two) times daily. 4 mg twice daily on day before, day of and day after chemotherapy 30 tablet 0  . fenofibrate 160 MG tablet Take 160 mg by mouth at bedtime.    . lidocaine-prilocaine (EMLA) cream Apply 1 application topically as needed. 30 g 0  . omeprazole (PRILOSEC) 20 MG capsule Take 20 mg by mouth every morning.     . prochlorperazine (COMPAZINE) 10 MG tablet Take 1 tablet (10 mg total) by mouth every 6 (six) hours as needed for nausea or vomiting. 30 tablet 0  . simvastatin (ZOCOR) 40 MG tablet Take 40 mg by mouth at bedtime.    . tamsulosin (FLOMAX) 0.4 MG CAPS capsule Take 0.4 mg by mouth daily after breakfast.     No current facility-administered medications for this visit.    Facility-Administered Medications Ordered in Other Visits  Medication Dose Route Frequency Provider Last Rate Last Dose  . sodium chloride flush (NS) 0.9 % injection 10 mL  10 mL Intravenous PRN Wyatt Portela, MD   10 mL at 01/20/18 0818     Allergies:  Allergies  Allergen Reactions  . Emend [Fosaprepitant Dimeglumine] Cough    Reaction during infusion with cough, erythema of face and neck.      Past Medical History, Surgical history, Social history, and Family History were reviewed and updated.  Review of Systems:  Remaining ROS negative.  Physical Exam: Blood  pressure (!) 150/87, pulse 80, temperature 98.2 F (36.8 C), temperature source Oral, resp. rate 20, height 5\' 8"  (1.727 m), weight 165 lb 3.2 oz (74.9 kg), SpO2 99 %.   ECOG: 0 General appearance: alert and cooperative appeared without distress. Head: Normocephalic, without obvious abnormality Oropharynx: No oral thrush or ulcers. Eyes: No scleral icterus.  Pupils are equal and round reactive to light. Lymph nodes: Cervical, supraclavicular, and axillary nodes  normal. Heart:regular rate and rhythm, S1, S2 normal, no murmur, click, rub or gallop Lung:chest clear, no wheezing, rales, normal symmetric air entry Abdomin: soft, non-tender, without masses or organomegaly. Neurological: No motor, sensory deficits.  Intact deep tendon reflexes. Skin: No rashes or lesions.  No ecchymosis or petechiae. Musculoskeletal: No joint deformity or effusion. Psychiatric: Mood and affect are appropriate.    Lab Results: Lab Results  Component Value Date   WBC 7.0 01/07/2018   HGB 14.1 01/07/2018   HCT 43.7 01/07/2018   MCV 91.4 01/07/2018   PLT 229 01/07/2018     Chemistry      Component Value Date/Time   NA 139 01/13/2018 0815   K 4.6 01/13/2018 0815   CL 106 01/13/2018 0815   CO2 23 01/13/2018 0815   BUN 22 01/13/2018 0815   CREATININE 1.17 01/13/2018 0815   CREATININE 0.95 08/01/2011 1140      Component Value Date/Time   CALCIUM 10.1 01/13/2018 0815   ALKPHOS 67 01/13/2018 0815   AST 22 01/13/2018 0815   ALT 19 01/13/2018 0815   BILITOT 0.8 01/13/2018 0815       Impression and Plan:  77 year old man with:  1.    T2 N0 high-grade urothelial carcinoma of the bladder diagnosed on December 11, 2017.    He is currently receiving neoadjuvant chemotherapy with gemcitabine and cisplatin.  Risks and benefits of continuing chemotherapy at this time was discussed and is agreeable to continue.  He will receive gemcitabine day 8 today without any dose reduction or delay.  Cycle 2 will start as scheduled in 2 weeks.  The plan is to proceed with total 4 cycles of therapy if he can tolerate it.   2.  IV access: Port-A-Cath inserted without complications.  No issues reported.  3.  Antiemetics: Compazine is available to the patient and continue to use as needed.  He was prescribed dexamethasone to take for 3 days after chemotherapy.  Dexamethasone will be given only after cisplatin and gemcitabine which will be day 1 of each cycle.  4.  Renal function  surveillance: His creatinine continues to be monitored with creatinine clearance continues to be close to 60 cc/min.  5.   Infusion related reaction: Related to demand which will be discontinued moving forward.  6.  Follow-up: We will be 2 weeks to start cycle 2 of chemotherapy.  25  minutes was spent with the patient face-to-face today.  More than 50% of time was dedicated to patient counseling, education and coordination of his multifaceted care   Zola Button, MD 4/10/20198:28 AM

## 2018-02-03 ENCOUNTER — Inpatient Hospital Stay: Payer: Medicare Other

## 2018-02-03 ENCOUNTER — Other Ambulatory Visit: Payer: Self-pay | Admitting: *Deleted

## 2018-02-03 ENCOUNTER — Inpatient Hospital Stay (HOSPITAL_BASED_OUTPATIENT_CLINIC_OR_DEPARTMENT_OTHER): Payer: Medicare Other | Admitting: Oncology

## 2018-02-03 ENCOUNTER — Encounter: Payer: Self-pay | Admitting: Oncology

## 2018-02-03 VITALS — BP 146/74 | HR 68 | Temp 97.5°F | Resp 18 | Ht 68.0 in | Wt 168.5 lb

## 2018-02-03 DIAGNOSIS — C679 Malignant neoplasm of bladder, unspecified: Secondary | ICD-10-CM

## 2018-02-03 DIAGNOSIS — C678 Malignant neoplasm of overlapping sites of bladder: Secondary | ICD-10-CM

## 2018-02-03 DIAGNOSIS — Z95828 Presence of other vascular implants and grafts: Secondary | ICD-10-CM

## 2018-02-03 DIAGNOSIS — Z5111 Encounter for antineoplastic chemotherapy: Secondary | ICD-10-CM | POA: Insufficient documentation

## 2018-02-03 LAB — CBC WITH DIFFERENTIAL (CANCER CENTER ONLY)
BASOS ABS: 0 10*3/uL (ref 0.0–0.1)
Basophils Relative: 1 %
Eosinophils Absolute: 0 10*3/uL (ref 0.0–0.5)
Eosinophils Relative: 0 %
HEMATOCRIT: 35.9 % — AB (ref 38.4–49.9)
Hemoglobin: 12.2 g/dL — ABNORMAL LOW (ref 13.0–17.1)
LYMPHS PCT: 20 %
Lymphs Abs: 0.8 10*3/uL — ABNORMAL LOW (ref 0.9–3.3)
MCH: 29.8 pg (ref 27.2–33.4)
MCHC: 34 g/dL (ref 32.0–36.0)
MCV: 87.7 fL (ref 79.3–98.0)
Monocytes Absolute: 0.9 10*3/uL (ref 0.1–0.9)
Monocytes Relative: 22 %
NEUTROS ABS: 2.2 10*3/uL (ref 1.5–6.5)
Neutrophils Relative %: 57 %
Platelet Count: 381 10*3/uL (ref 140–400)
RBC: 4.1 MIL/uL — AB (ref 4.20–5.82)
RDW: 13.4 % (ref 11.0–14.6)
WBC: 3.9 10*3/uL — AB (ref 4.0–10.3)

## 2018-02-03 LAB — CMP (CANCER CENTER ONLY)
ALBUMIN: 4.1 g/dL (ref 3.5–5.0)
ALT: 25 U/L (ref 0–55)
ANION GAP: 10 (ref 3–11)
AST: 18 U/L (ref 5–34)
Alkaline Phosphatase: 68 U/L (ref 40–150)
BILIRUBIN TOTAL: 0.7 mg/dL (ref 0.2–1.2)
BUN: 20 mg/dL (ref 7–26)
CO2: 22 mmol/L (ref 22–29)
Calcium: 9.9 mg/dL (ref 8.4–10.4)
Chloride: 104 mmol/L (ref 98–109)
Creatinine: 1.1 mg/dL (ref 0.70–1.30)
GFR, Est AFR Am: >60 mL/min (ref >60)
GLUCOSE: 111 mg/dL (ref 70–140)
POTASSIUM: 4.4 mmol/L (ref 3.5–5.1)
Sodium: 136 mmol/L (ref 136–145)
TOTAL PROTEIN: 7 g/dL (ref 6.4–8.3)

## 2018-02-03 MED ORDER — DEXAMETHASONE SODIUM PHOSPHATE 10 MG/ML IJ SOLN
10.0000 mg | Freq: Once | INTRAMUSCULAR | Status: AC
  Administered 2018-02-03: 10 mg via INTRAVENOUS

## 2018-02-03 MED ORDER — SODIUM CHLORIDE 0.9 % IV SOLN
1000.0000 mg/m2 | Freq: Once | INTRAVENOUS | Status: AC
  Administered 2018-02-03: 1900 mg via INTRAVENOUS
  Filled 2018-02-03: qty 49.97

## 2018-02-03 MED ORDER — SODIUM CHLORIDE 0.9 % IV SOLN
Freq: Once | INTRAVENOUS | Status: AC
  Administered 2018-02-03: 13:00:00 via INTRAVENOUS

## 2018-02-03 MED ORDER — PALONOSETRON HCL INJECTION 0.25 MG/5ML
0.2500 mg | Freq: Once | INTRAVENOUS | Status: AC
  Administered 2018-02-03: 0.25 mg via INTRAVENOUS

## 2018-02-03 MED ORDER — CISPLATIN CHEMO INJECTION 100MG/100ML
70.0000 mg/m2 | Freq: Once | INTRAVENOUS | Status: AC
  Administered 2018-02-03: 133 mg via INTRAVENOUS
  Filled 2018-02-03: qty 133

## 2018-02-03 MED ORDER — HEPARIN SOD (PORK) LOCK FLUSH 100 UNIT/ML IV SOLN
500.0000 [IU] | Freq: Once | INTRAVENOUS | Status: AC | PRN
  Administered 2018-02-03: 500 [IU]
  Filled 2018-02-03: qty 5

## 2018-02-03 MED ORDER — DEXAMETHASONE SODIUM PHOSPHATE 10 MG/ML IJ SOLN
INTRAMUSCULAR | Status: AC
  Filled 2018-02-03: qty 1

## 2018-02-03 MED ORDER — PALONOSETRON HCL INJECTION 0.25 MG/5ML
INTRAVENOUS | Status: AC
Start: 2018-02-03 — End: 2018-02-03
  Filled 2018-02-03: qty 5

## 2018-02-03 MED ORDER — POTASSIUM CHLORIDE 2 MEQ/ML IV SOLN
Freq: Once | INTRAVENOUS | Status: AC
  Administered 2018-02-03: 11:00:00 via INTRAVENOUS
  Filled 2018-02-03: qty 10

## 2018-02-03 MED ORDER — SODIUM CHLORIDE 0.9% FLUSH
10.0000 mL | INTRAVENOUS | Status: DC | PRN
  Administered 2018-02-03: 10 mL
  Filled 2018-02-03: qty 10

## 2018-02-03 MED ORDER — OLANZAPINE 10 MG PO TABS: 5.0000 mg | ORAL_TABLET | Freq: Every day | ORAL | 0 refills | Status: DC

## 2018-02-03 MED ORDER — SODIUM CHLORIDE 0.9% FLUSH
10.0000 mL | INTRAVENOUS | Status: DC | PRN
  Administered 2018-02-03: 10 mL via INTRAVENOUS
  Filled 2018-02-03: qty 10

## 2018-02-03 MED ORDER — OLANZAPINE 5 MG PO TBDP
5.0000 mg | ORAL_TABLET | Freq: Once | ORAL | Status: AC
  Administered 2018-02-03: 5 mg via ORAL
  Filled 2018-02-03: qty 1

## 2018-02-03 NOTE — Progress Notes (Signed)
Emend has been discontinued by Dr. Alen Blew due to previous reaction. Will add Zyprexa 5mg  today - may increase to 10mg  next cycle per MD, if needed. Patient will also take dexamethasone after treatment.  Raul Del, PharmD, BCPS, BCOP

## 2018-02-03 NOTE — Patient Instructions (Signed)
[facility] Discharge Instructions for Patients Receiving Chemotherapy  Today you received the following chemotherapy agents Gemzar and Cisplatin  To help prevent nausea and vomiting after your treatment, we encourage you to take your nausea medication as directed   If you develop nausea and vomiting that is not controlled by your nausea medication, call the clinic.   BELOW ARE SYMPTOMS THAT SHOULD BE REPORTED IMMEDIATELY:  *FEVER GREATER THAN 100.5 F  *CHILLS WITH OR WITHOUT FEVER  NAUSEA AND VOMITING THAT IS NOT CONTROLLED WITH YOUR NAUSEA MEDICATION  *UNUSUAL SHORTNESS OF BREATH  *UNUSUAL BRUISING OR BLEEDING  TENDERNESS IN MOUTH AND THROAT WITH OR WITHOUT PRESENCE OF ULCERS  *URINARY PROBLEMS  *BOWEL PROBLEMS  UNUSUAL RASH Items with * indicate a potential emergency and should be followed up as soon as possible.  Feel free to call the clinic should you have any questions or concerns. The clinic phone number is [phone].  Please show the CHEMO ALERT CARD at check-in to the Emergency Department and triage nurse.   

## 2018-02-03 NOTE — Progress Notes (Signed)
[name] OFFICE PROGRESS NOTE  Damon Squibb, MD [address] [name] [address]  DIAGNOSIS: 76 year old man with high-grade urothelial carcinoma of the bladder diagnosed on December 11, 2017.  He was found to have T2N0 disease. No lymphadenopathy or metastatic disease noted.  PRIOR THERAPY: He is status post TURBT on December 11, 2017. The final pathology showed high-grade invasive urothelial carcinoma with involvement of the muscularis propria at the base of the bladder.   CURRENT THERAPY: Neoadjuvant chemotherapy utilizing cisplatin and gemcitabine.  Day 1 of cycle 1 was 02/09/2018.  He is here for day 1 of cycle 2.  INTERVAL HISTORY: [name] 77 y.o. male returns for routine follow-up visit accompanied by his wife.  Patient continues to tolerate his chemotherapy fairly well.  He denies fevers and chills.  Denies chest pain, shortness breath, cough, hemoptysis.  Denies nausea, vomiting, constipation, diarrhea.  Denies weight loss night sweats.  The patient denies hematuria and dysuria.  Denies headaches, blurred vision, syncope, seizures.  Denies skeletal complaints.  Denies skin rashes or lesions.  The remaining review of systems is negative.  The patient is here for evaluation prior to day 1 of cycle 2.  MEDICAL HISTORY: Past Medical History:  Diagnosis Date  . Bladder cancer [facility])    urologist-  dr Tresa Moore  . BPH (benign prostatic hyperplasia)   . Full dentures   . GERD (gastroesophageal reflux disease)   . Hiatal hernia   . History of bladder stone   . History of kidney stones    per pt hx passed stones spontenaously  . Hypercholesterolemia   . Wears glasses   . Wears hearing aid in both ears     ALLERGIES:  is allergic to [employer] dimeglumine].  MEDICATIONS:  Current Outpatient Medications  Medication Sig Dispense Refill  . dexamethasone (DECADRON) 4 MG tablet Take 1 tablet (4 mg total) by mouth 2 (two) times daily. 4 mg twice daily on  day before, day of and day after chemotherapy 30 tablet 0  . fenofibrate 160 MG tablet Take 160 mg by mouth at bedtime.    . lidocaine-prilocaine (EMLA) cream Apply 1 application topically as needed. 30 g 0  . omeprazole (PRILOSEC) 20 MG capsule Take 20 mg by mouth every morning.     . prochlorperazine (COMPAZINE) 10 MG tablet Take 1 tablet (10 mg total) by mouth every 6 (six) hours as needed for nausea or vomiting. 30 tablet 0  . simvastatin (ZOCOR) 40 MG tablet Take 40 mg by mouth at bedtime.    . tamsulosin (FLOMAX) 0.4 MG CAPS capsule Take 0.4 mg by mouth daily after breakfast.     No current facility-administered medications for this visit.     SURGICAL HISTORY:  Past Surgical History:  Procedure Laterality Date  . COLONOSCOPY WITH ESOPHAGOGASTRODUODENOSCOPY (EGD)  04-08-2012  dr [name]  . CYSTO/  BLADDER STONE EXTRACTIONS  1998 approx.  . IR FLUORO GUIDE PORT INSERTION RIGHT  01/07/2018  . IR US GUIDE VASC ACCESS RIGHT  01/07/2018  . TRANSURETHRAL RESECTION OF BLADDER TUMOR N/A 12/11/2017   Procedure: CYSTOSCOPY WITH BILATERAL RETROGRADE PYELOGRAM, TRANSURETHRAL RESECTION OF BLADDER TUMOR (TURBT);  Surgeon: Alexis Frock, MD;  Location: [facility];  Service: Urology;  Laterality: N/A;  . UMBILICAL HERNIA REPAIR  2008  . VASECTOMY  1980s    REVIEW OF SYSTEMS:   Review of Systems  Constitutional: Negative for appetite change, chills, fatigue, fever and unexpected weight change.  HENT:   Negative for mouth sores, nosebleeds, sore throat and trouble swallowing.   Eyes: Negative for eye problems and icterus.  Respiratory: Negative for cough, hemoptysis, shortness of breath and wheezing.   Cardiovascular: Negative for chest pain and leg swelling.  Gastrointestinal: Negative for abdominal pain, constipation, diarrhea, nausea and vomiting.  Genitourinary: Negative for bladder incontinence, difficulty urinating, dysuria, frequency and hematuria.   Musculoskeletal: Negative  for back pain, gait problem, neck pain and neck stiffness.  Skin: Negative for itching and rash.  Neurological: Negative for dizziness, extremity weakness, gait problem, headaches, light-headedness and seizures.  Hematological: Negative for adenopathy. Does not bruise/bleed easily.  Psychiatric/Behavioral: Negative for confusion, depression and sleep disturbance. The patient is not nervous/anxious.     PHYSICAL EXAMINATION:  Blood pressure (!) 146/74, pulse 68, temperature (!) 97.5 F (36.4 C), temperature source Oral, resp. rate 18, height 5\' 8"  (1.727 m), weight 168 lb 8 oz (76.4 kg), SpO2 100 %.  ECOG PERFORMANCE STATUS: 0 - Asymptomatic  Physical Exam  Constitutional: Oriented to person, place, and time and well-developed, well-nourished, and in no distress. No distress.  HENT:  Head: Normocephalic and atraumatic.  Mouth/Throat: Oropharynx is clear and moist. No oropharyngeal exudate.  Eyes: Conjunctivae are normal. Right eye exhibits no discharge. Left eye exhibits no discharge. No scleral icterus.  Neck: Normal range of motion. Neck supple.  Cardiovascular: Normal rate, regular rhythm, normal heart sounds and intact distal pulses.   Pulmonary/Chest: Effort normal and breath sounds normal. No respiratory distress. No wheezes. No rales.  Abdominal: Soft. Bowel sounds are normal. Exhibits no distension and no mass. There is no tenderness.  Musculoskeletal: Normal range of motion. Exhibits no edema.  Lymphadenopathy:    No cervical adenopathy.  Neurological: Alert and oriented to person, place, and time. Exhibits normal muscle tone. Gait normal. Coordination normal.  Skin: Skin is warm and dry. No rash noted. Not diaphoretic. No erythema. No pallor.  Psychiatric: Mood, memory and judgment normal.  Vitals reviewed.  LABORATORY DATA: Lab Results  Component Value Date   WBC 3.9 (L) 02/03/2018   HGB 12.2 (L) 02/03/2018   HCT 35.9 (L) 02/03/2018   MCV 87.7 02/03/2018   PLT 381  02/03/2018      Chemistry      Component Value Date/Time   NA 134 (L) 01/20/2018 0815   K 4.3 01/20/2018 0815   CL 102 01/20/2018 0815   CO2 22 01/20/2018 0815   BUN 27 (H) 01/20/2018 0815   CREATININE 1.18 01/20/2018 0815   CREATININE 0.95 08/01/2011 1140      Component Value Date/Time   CALCIUM 9.9 01/20/2018 0815   ALKPHOS 72 01/20/2018 0815   AST 23 01/20/2018 0815   ALT 49 01/20/2018 0815   BILITOT 0.9 01/20/2018 0815       RADIOGRAPHIC STUDIES:  Ir US Guide Vasc Access Right  Result Date: 01/07/2018 CLINICAL DATA:  Bladder carcinoma and need for porta cath to begin chemotherapy. EXAM: IMPLANTED PORT A CATH PLACEMENT WITH ULTRASOUND AND FLUOROSCOPIC GUIDANCE ANESTHESIA/SEDATION: 2.5 mg IV Versed; 150 mcg IV Fentanyl Total Moderate Sedation Time:  31 minutes The patient's level of consciousness and physiologic status were continuously monitored during the procedure by Radiology nursing. Additional Medications: 2 g IV Ancef. FLUOROSCOPY TIME:  24 seconds.  3.8 mGy. PROCEDURE: The procedure, risks, benefits, and alternatives were explained to the patient. Questions regarding the procedure were encouraged and answered. The patient understands and consents to the procedure. A time-out was performed prior to initiating  the procedure. Ultrasound was utilized to confirm patency of the right internal jugular vein. The right neck and chest were prepped with chlorhexidine in a sterile fashion, and a sterile drape was applied covering the operative field. Maximum barrier sterile technique with sterile gowns and gloves were used for the procedure. Local anesthesia was provided with 1% lidocaine. After creating a small venotomy incision, a 21 gauge needle was advanced into the right internal jugular vein under direct, real-time ultrasound guidance. Ultrasound image documentation was performed. After securing guidewire access, an 8 Fr dilator was placed. A J-wire was kinked to measure appropriate  catheter length. A subcutaneous port pocket was then created along the upper chest wall utilizing sharp and blunt dissection. Portable cautery was utilized. The pocket was irrigated with sterile saline. A single lumen power injectable port was chosen for placement. The 8 Fr catheter was tunneled from the port pocket site to the venotomy incision. The port was placed in the pocket. External catheter was trimmed to appropriate length based on guidewire measurement. At the venotomy, an 8 Fr peel-away sheath was placed over a guidewire. The catheter was then placed through the sheath and the sheath removed. Final catheter positioning was confirmed and documented with a fluoroscopic spot image. The port was accessed with a needle and aspirated and flushed with heparinized saline. The access needle was removed. The venotomy and port pocket incisions were closed with subcutaneous 3-0 Monocryl and subcuticular 4-0 Vicryl. Dermabond was applied to both incisions. COMPLICATIONS: COMPLICATIONS None FINDINGS: After catheter placement, the tip lies at the cavo-atrial junction. The catheter aspirates normally and is ready for immediate use. IMPRESSION: Placement of single lumen port a cath via right internal jugular vein. The catheter tip lies at the cavo-atrial junction. A power injectable port a cath was placed and is ready for immediate use. Electronically Signed   By: Aletta Edouard M.D.   On: 01/07/2018 16:59   Ir Fluoro Guide Port Insertion Right  Result Date: 01/07/2018 CLINICAL DATA:  Bladder carcinoma and need for porta cath to begin chemotherapy. EXAM: IMPLANTED PORT A CATH PLACEMENT WITH ULTRASOUND AND FLUOROSCOPIC GUIDANCE ANESTHESIA/SEDATION: 2.5 mg IV Versed; 150 mcg IV Fentanyl Total Moderate Sedation Time:  31 minutes The patient's level of consciousness and physiologic status were continuously monitored during the procedure by Radiology nursing. Additional Medications: 2 g IV Ancef. FLUOROSCOPY TIME:  24  seconds.  3.8 mGy. PROCEDURE: The procedure, risks, benefits, and alternatives were explained to the patient. Questions regarding the procedure were encouraged and answered. The patient understands and consents to the procedure. A time-out was performed prior to initiating the procedure. Ultrasound was utilized to confirm patency of the right internal jugular vein. The right neck and chest were prepped with chlorhexidine in a sterile fashion, and a sterile drape was applied covering the operative field. Maximum barrier sterile technique with sterile gowns and gloves were used for the procedure. Local anesthesia was provided with 1% lidocaine. After creating a small venotomy incision, a 21 gauge needle was advanced into the right internal jugular vein under direct, real-time ultrasound guidance. Ultrasound image documentation was performed. After securing guidewire access, an 8 Fr dilator was placed. A J-wire was kinked to measure appropriate catheter length. A subcutaneous port pocket was then created along the upper chest wall utilizing sharp and blunt dissection. Portable cautery was utilized. The pocket was irrigated with sterile saline. A single lumen power injectable port was chosen for placement. The 8 Fr catheter was tunneled from the port pocket  site to the venotomy incision. The port was placed in the pocket. External catheter was trimmed to appropriate length based on guidewire measurement. At the venotomy, an 8 Fr peel-away sheath was placed over a guidewire. The catheter was then placed through the sheath and the sheath removed. Final catheter positioning was confirmed and documented with a fluoroscopic spot image. The port was accessed with a needle and aspirated and flushed with heparinized saline. The access needle was removed. The venotomy and port pocket incisions were closed with subcutaneous 3-0 Monocryl and subcuticular 4-0 Vicryl. Dermabond was applied to both incisions. COMPLICATIONS:  COMPLICATIONS None FINDINGS: After catheter placement, the tip lies at the cavo-atrial junction. The catheter aspirates normally and is ready for immediate use. IMPRESSION: Placement of single lumen port a cath via right internal jugular vein. The catheter tip lies at the cavo-atrial junction. A power injectable port a cath was placed and is ready for immediate use. Electronically Signed   By: Aletta Edouard M.D.   On: 01/07/2018 16:59     ASSESSMENT/PLAN:   77 year old man with:  1.   T2 N0 high-grade urothelial carcinoma of the bladder diagnosed on December 11, 2017.   He is currently receiving neoadjuvant chemotherapy with gemcitabine and cisplatin.  Risks and benefits of continuing chemotherapy at this time was discussed and is agreeable to continue.  He will receive gemcitabine day 1 of cycle 2 today without any dose reduction or delay. The plan is to proceed with total 4 cycles of therapy if he can tolerate it.  2. IV access: Port-A-Cath inserted without complications.  No issues reported.  3. Antiemetics: Compazine is available to the patient and continue to use as needed.  He has dexamethasone to take for 3 days after chemotherapy.  Dexamethasone will be given only after cisplatin and gemcitabine which will be day 1 of each cycle.  4. Renal function surveillance: His creatinine continues to be monitored with creatinine clearance continues to be close to 60 cc/min.  Creatinine is pending today.  5.  Infusion related reaction: Related to Emend which will be discontinued moving forward.  6. Follow-up: We will be  in 1 week for evaluation prior to day 8 of cycle 2.  No orders of the defined types were placed in this encounter.  Mikey Bussing, DNP, AGPCNP-BC, AOCNP 02/03/18

## 2018-02-10 ENCOUNTER — Inpatient Hospital Stay: Payer: Medicare Other

## 2018-02-10 ENCOUNTER — Inpatient Hospital Stay: Payer: Medicare Other | Attending: Oncology | Admitting: Oncology

## 2018-02-10 VITALS — BP 129/79 | HR 77 | Temp 97.8°F | Resp 18 | Ht 68.0 in | Wt 167.3 lb

## 2018-02-10 DIAGNOSIS — C679 Malignant neoplasm of bladder, unspecified: Secondary | ICD-10-CM | POA: Insufficient documentation

## 2018-02-10 DIAGNOSIS — C678 Malignant neoplasm of overlapping sites of bladder: Secondary | ICD-10-CM

## 2018-02-10 DIAGNOSIS — Z5111 Encounter for antineoplastic chemotherapy: Secondary | ICD-10-CM | POA: Diagnosis not present

## 2018-02-10 DIAGNOSIS — Z95828 Presence of other vascular implants and grafts: Secondary | ICD-10-CM | POA: Insufficient documentation

## 2018-02-10 DIAGNOSIS — C671 Malignant neoplasm of dome of bladder: Secondary | ICD-10-CM

## 2018-02-10 LAB — CBC WITH DIFFERENTIAL (CANCER CENTER ONLY)
Basophils Absolute: 0 10*3/uL (ref 0.0–0.1)
Basophils Relative: 0 %
Eosinophils Absolute: 0 10*3/uL (ref 0.0–0.5)
Eosinophils Relative: 0 %
HEMATOCRIT: 36 % — AB (ref 38.4–49.9)
Hemoglobin: 12.1 g/dL — ABNORMAL LOW (ref 13.0–17.1)
LYMPHS ABS: 1.2 10*3/uL (ref 0.9–3.3)
Lymphocytes Relative: 46 %
MCH: 29.7 pg (ref 27.2–33.4)
MCHC: 33.6 g/dL (ref 32.0–36.0)
MCV: 88.5 fL (ref 79.3–98.0)
MONO ABS: 0.1 10*3/uL (ref 0.1–0.9)
Monocytes Relative: 3 %
NEUTROS ABS: 1.3 10*3/uL — AB (ref 1.5–6.5)
Neutrophils Relative %: 51 %
PLATELETS: 179 10*3/uL (ref 140–400)
RBC: 4.07 MIL/uL — AB (ref 4.20–5.82)
RDW: 13.5 % (ref 11.0–14.6)
WBC Count: 2.6 10*3/uL — ABNORMAL LOW (ref 4.0–10.3)

## 2018-02-10 LAB — CMP (CANCER CENTER ONLY)
ALK PHOS: 78 U/L (ref 40–150)
ALT: 50 U/L (ref 0–55)
ANION GAP: 8 (ref 3–11)
AST: 36 U/L — ABNORMAL HIGH (ref 5–34)
Albumin: 4.1 g/dL (ref 3.5–5.0)
BUN: 20 mg/dL (ref 7–26)
CO2: 25 mmol/L (ref 22–29)
CREATININE: 1.09 mg/dL (ref 0.70–1.30)
Calcium: 9.5 mg/dL (ref 8.4–10.4)
Chloride: 105 mmol/L (ref 98–109)
Glucose, Bld: 98 mg/dL (ref 70–140)
Potassium: 4.7 mmol/L (ref 3.5–5.1)
Sodium: 138 mmol/L (ref 136–145)
Total Bilirubin: 0.8 mg/dL (ref 0.2–1.2)
Total Protein: 6.8 g/dL (ref 6.4–8.3)

## 2018-02-10 MED ORDER — PROCHLORPERAZINE MALEATE 10 MG PO TABS
10.0000 mg | ORAL_TABLET | Freq: Once | ORAL | Status: AC
  Administered 2018-02-10: 10 mg via ORAL

## 2018-02-10 MED ORDER — SODIUM CHLORIDE 0.9% FLUSH
10.0000 mL | INTRAVENOUS | Status: DC | PRN
  Administered 2018-02-10: 10 mL
  Filled 2018-02-10: qty 10

## 2018-02-10 MED ORDER — SODIUM CHLORIDE 0.9% FLUSH
10.0000 mL | Freq: Once | INTRAVENOUS | Status: AC
  Administered 2018-02-10: 10 mL
  Filled 2018-02-10: qty 10

## 2018-02-10 MED ORDER — SODIUM CHLORIDE 0.9 % IV SOLN
Freq: Once | INTRAVENOUS | Status: AC
  Administered 2018-02-10: 12:00:00 via INTRAVENOUS

## 2018-02-10 MED ORDER — PROCHLORPERAZINE MALEATE 10 MG PO TABS
ORAL_TABLET | ORAL | Status: AC
  Filled 2018-02-10: qty 1

## 2018-02-10 MED ORDER — HEPARIN SOD (PORK) LOCK FLUSH 100 UNIT/ML IV SOLN
500.0000 [IU] | Freq: Once | INTRAVENOUS | Status: AC | PRN
  Administered 2018-02-10: 500 [IU]
  Filled 2018-02-10: qty 5

## 2018-02-10 MED ORDER — SODIUM CHLORIDE 0.9 % IV SOLN
1000.0000 mg/m2 | Freq: Once | INTRAVENOUS | Status: AC
  Administered 2018-02-10: 1900 mg via INTRAVENOUS
  Filled 2018-02-10: qty 50

## 2018-02-10 NOTE — Progress Notes (Signed)
Okay to treat with ANC of 1.3 per Dr. Shadad. 

## 2018-02-10 NOTE — Patient Instructions (Signed)
[facility] °Discharge Instructions for Patients Receiving Chemotherapy ° °Today you received the following chemotherapy agents Gemzar ° °To help prevent nausea and vomiting after your treatment, we encourage you to take your nausea medication as directed. °  °If you develop nausea and vomiting that is not controlled by your nausea medication, call the clinic.  ° °BELOW ARE SYMPTOMS THAT SHOULD BE REPORTED IMMEDIATELY: °· *FEVER GREATER THAN 100.5 F °· *CHILLS WITH OR WITHOUT FEVER °· NAUSEA AND VOMITING THAT IS NOT CONTROLLED WITH YOUR NAUSEA MEDICATION °· *UNUSUAL SHORTNESS OF BREATH °· *UNUSUAL BRUISING OR BLEEDING °· TENDERNESS IN MOUTH AND THROAT WITH OR WITHOUT PRESENCE OF ULCERS °· *URINARY PROBLEMS °· *BOWEL PROBLEMS °· UNUSUAL RASH °Items with * indicate a potential emergency and should be followed up as soon as possible. ° °Feel free to call the clinic should you have any questions or concerns. The clinic phone number is [phone]. ° °Please show the CHEMO ALERT CARD at check-in to the Emergency Department and triage nurse. ° ° °

## 2018-02-10 NOTE — Progress Notes (Signed)
Hematology and Oncology Follow Up Visit  [name] [id number] [birth year] 77 y.o. 02/10/2018 9:53 AM [name], MDHall, Damon Areola, MD   Principle Diagnosis: 77 year old man with T2N0 high-grade urothelial carcinoma of the bladder diagnosed on December 11, 2017.   Prior Therapy:  He is status post TURBT on December 11, 2017.  The final pathology showed high-grade invasive urothelial carcinoma with involvement of the muscularis propria at the base of the bladder.   Current therapy: Neoadjuvant chemotherapy utilizing cisplatin and gemcitabine.  Cycle 1 started on January 13, 2018.  He is here for day 8 of cycle 2 of therapy.  Interim History: [name] is here for a follow-up visit.  He reports no major changes since the last visit.  He continues to tolerate chemotherapy without complications.  He did report grade 1 fatigue associated with day 1 of cycle 2 of chemotherapy that lasted for 2 days.  After that, he regained all activities of daily living and back to baseline function.  He denies any nausea or vomiting.  He denies any worsening neuropathy.  He took Zyprexa for 1 day and thought it was associated with excessive fatigue and did not take it after that.  His appetite and performance status remains excellent.  He denies any urinary symptoms including hematuria or dysuria.   He does not report any headaches, blurry vision, syncope or seizures. Does not report any fevers, chills or sweats.  Does not report any cough, wheezing or hemoptysis.  Does not report any chest pain, palpitation, orthopnea or leg edema.  Does not report any nausea, vomiting or abdominal pain.  Does not report any constipation or diarrhea.  Does not report any skeletal complaints.    Does not report frequency, urgency or hematuria.  Does not report any skin rashes or lesions. Does not report any heat or cold intolerance.  Does not report any lymphadenopathy or petechiae.  Does not report any anxiety or depression.  Remaining review of  systems is negative.    Medications: I have reviewed the patient's current medications.  Current Outpatient Medications  Medication Sig Dispense Refill  . dexamethasone (DECADRON) 4 MG tablet Take 1 tablet (4 mg total) by mouth 2 (two) times daily. 4 mg twice daily on day before, day of and day after chemotherapy 30 tablet 0  . fenofibrate 160 MG tablet Take 160 mg by mouth at bedtime.    . lidocaine-prilocaine (EMLA) cream Apply 1 application topically as needed. 30 g 0  . OLANZapine (ZYPREXA) 10 MG tablet Take 0.5 tablets (5 mg total) by mouth at bedtime. 10 tablet 0  . omeprazole (PRILOSEC) 20 MG capsule Take 20 mg by mouth every morning.     . prochlorperazine (COMPAZINE) 10 MG tablet Take 1 tablet (10 mg total) by mouth every 6 (six) hours as needed for nausea or vomiting. 30 tablet 0  . simvastatin (ZOCOR) 40 MG tablet Take 40 mg by mouth at bedtime.    . tamsulosin (FLOMAX) 0.4 MG CAPS capsule Take 0.4 mg by mouth daily after breakfast.     No current facility-administered medications for this visit.      Allergies:  Allergies  Allergen Reactions  . Emend [Fosap[employer]ant Dimeglumine] Cough    Reaction during infusion with cough, erythema of face and neck.      Past Medical History, Surgical history, Social history, and Family History were reviewed and updated.    Physical Exam:  Blood pressure 129/79, pulse 77, temperature 97.8 F (36.6  C), temperature source Oral, resp. rate 18, height 5\' 8"  (1.727 m), weight 167 lb 4.8 oz (75.9 kg), SpO2 100 %.   ECOG: 0 General appearance: Well-appearing gentleman without distress. Head: Atraumatic without abnormalities Oropharynx: No oral ulcers or thrush. Eyes: Conjunctiva is clear. Lymph nodes: No lymphadenopathy noted in the cervical, supraclavicular, and axillary nodes  Heart:regular rate and rhythm, without any murmurs. Lung: clear to auscultation without any rhonchi, wheezes.  No dullness to percussion. Abdomin: soft,  nondistended with good bowel sounds.  No rebound or guarding. Neurological: No deficits in the motor or sensory exam. Skin: No petechia or ecchymosis.  Skin appear moist. Musculoskeletal: Clubbing or cyanosis. Psychiatric: Appropriate mood and affect.    Lab Results: Lab Results  Component Value Date   WBC 3.9 (L) 02/03/2018   HGB 12.2 (L) 02/03/2018   HCT 35.9 (L) 02/03/2018   MCV 87.7 02/03/2018   PLT 381 02/03/2018     Chemistry      Component Value Date/Time   NA 136 02/03/2018 0822   K 4.4 02/03/2018 0822   CL 104 02/03/2018 0822   CO2 22 02/03/2018 0822   BUN 20 02/03/2018 0822   CREATININE 1.10 02/03/2018 0822   CREATININE 0.95 08/01/2011 1140      Component Value Date/Time   CALCIUM 9.9 02/03/2018 0822   ALKPHOS 68 02/03/2018 0822   AST 18 02/03/2018 0822   ALT 25 02/03/2018 0822   BILITOT 0.7 02/03/2018 0822       Impression and Plan:  77 year old man with:  1.  High-grade urothelial carcinoma of the bladder presented with a T2N0 on December 11, 2017.    He is receiving neoadjuvant chemotherapy with cisplatin and gemcitabine without any major complications.  He is ready to proceed with day 8 of cycle 2 of therapy for planned of 4 cycles.  The rationale for completing chemotherapy was discussed today.  The likelihood of admitting day 8 of subsequent cycles was also discussed related to myelosuppression.  Plan is to complete 4 cycles of therapy which will conclude on March 24, 2018.  At that time will have a follow-up with Dr. Tresa Moore to pursue radical cystectomy.   2.  IV access: Port-A-Cath remains in use without any issues.  3.  Antiemetics: He is allergic to Emend and has used dexamethasone to prevent delayed nausea and vomiting.  Zyprexa was added but did not take it with the last cycle of therapy.  Overall he has not experienced any nausea or vomiting.  4.  Renal function surveillance: Creatinine remained stable with adequate urine output.  5.    Infusion related reaction: No further issues reported at this time.  6.  Follow-up: We will be 2 weeks for the start of cycle 3 of therapy.  25  minutes was spent with the patient face-to-face today.  More than 50% of time was dedicated to patient counseling, education and answering questions regarding his future plan of care.   Zola Button, MD 5/1/20199:53 AM

## 2018-02-11 ENCOUNTER — Telehealth: Payer: Self-pay

## 2018-02-11 NOTE — Telephone Encounter (Signed)
Spoke with patient concerning upcoming appointment. Also mailed calender and letter. Per 5/1 los

## 2018-02-24 ENCOUNTER — Inpatient Hospital Stay: Payer: Medicare Other

## 2018-02-24 ENCOUNTER — Inpatient Hospital Stay (HOSPITAL_BASED_OUTPATIENT_CLINIC_OR_DEPARTMENT_OTHER): Payer: Medicare Other | Admitting: Oncology

## 2018-02-24 VITALS — BP 145/98 | HR 76 | Temp 97.8°F | Resp 17 | Ht 68.0 in | Wt 169.0 lb

## 2018-02-24 DIAGNOSIS — C671 Malignant neoplasm of dome of bladder: Secondary | ICD-10-CM

## 2018-02-24 DIAGNOSIS — Z95828 Presence of other vascular implants and grafts: Secondary | ICD-10-CM

## 2018-02-24 DIAGNOSIS — Z5111 Encounter for antineoplastic chemotherapy: Secondary | ICD-10-CM | POA: Diagnosis not present

## 2018-02-24 DIAGNOSIS — C679 Malignant neoplasm of bladder, unspecified: Secondary | ICD-10-CM

## 2018-02-24 DIAGNOSIS — C678 Malignant neoplasm of overlapping sites of bladder: Secondary | ICD-10-CM

## 2018-02-24 LAB — CBC WITH DIFFERENTIAL (CANCER CENTER ONLY)
Basophils Absolute: 0 10*3/uL (ref 0.0–0.1)
Basophils Relative: 0 %
Eosinophils Absolute: 0 10*3/uL (ref 0.0–0.5)
Eosinophils Relative: 0 %
HCT: 35.5 % — ABNORMAL LOW (ref 38.4–49.9)
HEMOGLOBIN: 12.1 g/dL — AB (ref 13.0–17.1)
LYMPHS PCT: 12 %
Lymphs Abs: 0.7 10*3/uL — ABNORMAL LOW (ref 0.9–3.3)
MCH: 30.2 pg (ref 27.2–33.4)
MCHC: 34.2 g/dL (ref 32.0–36.0)
MCV: 88.4 fL (ref 79.3–98.0)
Monocytes Absolute: 0.3 10*3/uL (ref 0.1–0.9)
Monocytes Relative: 5 %
NEUTROS PCT: 83 %
Neutro Abs: 4.9 10*3/uL (ref 1.5–6.5)
Platelet Count: 210 10*3/uL (ref 140–400)
RBC: 4.01 MIL/uL — AB (ref 4.20–5.82)
RDW: 16.4 % — ABNORMAL HIGH (ref 11.0–14.6)
WBC: 5.9 10*3/uL (ref 4.0–10.3)

## 2018-02-24 LAB — CMP (CANCER CENTER ONLY)
ALT: 21 U/L (ref 0–55)
AST: 18 U/L (ref 5–34)
Albumin: 4.3 g/dL (ref 3.5–5.0)
Alkaline Phosphatase: 71 U/L (ref 40–150)
Anion gap: 8 (ref 3–11)
BUN: 24 mg/dL (ref 7–26)
CHLORIDE: 106 mmol/L (ref 98–109)
CO2: 23 mmol/L (ref 22–29)
Calcium: 9.5 mg/dL (ref 8.4–10.4)
Creatinine: 1.26 mg/dL (ref 0.70–1.30)
GFR, EST NON AFRICAN AMERICAN: 54 mL/min — AB (ref >60)
Glucose, Bld: 202 mg/dL — ABNORMAL HIGH (ref 70–140)
POTASSIUM: 4.3 mmol/L (ref 3.5–5.1)
SODIUM: 137 mmol/L (ref 136–145)
Total Bilirubin: 0.7 mg/dL (ref 0.2–1.2)
Total Protein: 7.2 g/dL (ref 6.4–8.3)

## 2018-02-24 MED ORDER — DEXAMETHASONE SODIUM PHOSPHATE 10 MG/ML IJ SOLN
INTRAMUSCULAR | Status: AC
  Filled 2018-02-24: qty 1

## 2018-02-24 MED ORDER — GEMCITABINE HCL CHEMO INJECTION 1 GM/26.3ML
1000.0000 mg/m2 | Freq: Once | INTRAVENOUS | Status: AC
  Administered 2018-02-24: 1900 mg via INTRAVENOUS
  Filled 2018-02-24: qty 49.97

## 2018-02-24 MED ORDER — SODIUM CHLORIDE 0.9% FLUSH
10.0000 mL | Freq: Once | INTRAVENOUS | Status: AC
  Administered 2018-02-24: 10 mL
  Filled 2018-02-24: qty 10

## 2018-02-24 MED ORDER — SODIUM CHLORIDE 0.9 % IV SOLN
70.0000 mg/m2 | Freq: Once | INTRAVENOUS | Status: AC
  Administered 2018-02-24: 133 mg via INTRAVENOUS
  Filled 2018-02-24: qty 133

## 2018-02-24 MED ORDER — PALONOSETRON HCL INJECTION 0.25 MG/5ML
0.2500 mg | Freq: Once | INTRAVENOUS | Status: AC
  Administered 2018-02-24: 0.25 mg via INTRAVENOUS

## 2018-02-24 MED ORDER — PALONOSETRON HCL INJECTION 0.25 MG/5ML
INTRAVENOUS | Status: AC
  Filled 2018-02-24: qty 5

## 2018-02-24 MED ORDER — SODIUM CHLORIDE 0.9 % IV SOLN: Freq: Once | INTRAVENOUS | Status: DC

## 2018-02-24 MED ORDER — DEXAMETHASONE SODIUM PHOSPHATE 10 MG/ML IJ SOLN
10.0000 mg | Freq: Once | INTRAMUSCULAR | Status: AC
  Administered 2018-02-24: 10 mg via INTRAVENOUS

## 2018-02-24 MED ORDER — SODIUM CHLORIDE 0.9% FLUSH
10.0000 mL | INTRAVENOUS | Status: DC | PRN
  Administered 2018-02-24: 10 mL
  Filled 2018-02-24: qty 10

## 2018-02-24 MED ORDER — POTASSIUM CHLORIDE 2 MEQ/ML IV SOLN
Freq: Once | INTRAVENOUS | Status: AC
  Administered 2018-02-24: 11:00:00 via INTRAVENOUS
  Filled 2018-02-24: qty 10

## 2018-02-24 MED ORDER — HEPARIN SOD (PORK) LOCK FLUSH 100 UNIT/ML IV SOLN
500.0000 [IU] | Freq: Once | INTRAVENOUS | Status: AC | PRN
  Administered 2018-02-24: 500 [IU]
  Filled 2018-02-24: qty 5

## 2018-02-24 MED ORDER — HEPARIN SOD (PORK) LOCK FLUSH 100 UNIT/ML IV SOLN
500.0000 [IU] | Freq: Once | INTRAVENOUS | Status: DC
  Filled 2018-02-24: qty 5

## 2018-02-24 NOTE — Progress Notes (Signed)
02/24/18 @ 1015  DC Zyprexa from pre-medications and home medications due to side effects.  T.O. Dr Creta Levin, PharmD

## 2018-02-24 NOTE — Patient Instructions (Signed)
[facility] Discharge Instructions for Patients Receiving Chemotherapy  Today you received the following chemotherapy agents Cisplatin; Gemzar  To help prevent nausea and vomiting after your treatment, we encourage you to take your nausea medication as directed  If you develop nausea and vomiting that is not controlled by your nausea medication, call the clinic.   BELOW ARE SYMPTOMS THAT SHOULD BE REPORTED IMMEDIATELY:  *FEVER GREATER THAN 100.5 F  *CHILLS WITH OR WITHOUT FEVER  NAUSEA AND VOMITING THAT IS NOT CONTROLLED WITH YOUR NAUSEA MEDICATION  *UNUSUAL SHORTNESS OF BREATH  *UNUSUAL BRUISING OR BLEEDING  TENDERNESS IN MOUTH AND THROAT WITH OR WITHOUT PRESENCE OF ULCERS  *URINARY PROBLEMS  *BOWEL PROBLEMS  UNUSUAL RASH Items with * indicate a potential emergency and should be followed up as soon as possible.  Feel free to call the clinic should you have any questions or concerns. The clinic phone number is [phone].  Please show the [place] at check-in to the Emergency Department and triage nurse.

## 2018-02-24 NOTE — Progress Notes (Signed)
Hematology and Oncology Follow Up Visit  ANANTH FIALLOS [id number] 02/05/1941 77 y.o. 02/24/2018 8:55 AM [name], MDHall, Edwinna Areola, MD   Principle Diagnosis: 77 year old man with T2N0 high-grade carcinoma of the bladder diagnosed in March of 2019.   Prior Therapy:  He is status post TURBT on December 11, 2017.  The final pathology showed high-grade invasive urothelial carcinoma with involvement of the muscularis propria at the base of the bladder.   Current therapy: Neoadjuvant chemotherapy utilizing cisplatin and gemcitabine.  Cycle 1 started on January 13, 2018.  He is here for day 1 of cycle 3 of therapy.  Interim History: Mr. Clopper is today for a follow-up.  He tolerated the last cycle of chemotherapy without complications.  He denies any nausea, vomiting or neuropathy.  He continues to attend to activities of daily living without any decline.  He has been lifting heavy objects and noticed some pain in his right lower quadrant.  His pain is a mild nagging pain and not exacerbated by breathing or diet.  He is able to ambulate without any difficulties.  Is moving his bowels and urinating without any difficulties.  He does not report any headaches, blurry vision, syncope or seizures. Does not report any fevers, chills or sweats.  Does not report any cough, wheezing or hemoptysis.  Does not report any chest pain, palpitation, orthopnea or leg edema.  Does not report any nausea, vomiting or abdominal pain.  Does not report any hematochezia or melena. Does not report any paralysis or myalgias   Does not report frequency, urgency or hematuria.  Does not report any skin rashes or lesions. Does not report any heat or cold intolerance.  Does not report any lymphadenopathy or petechiae.  Does not report any gastric issues.  Remaining review of systems is negative.    Medications: I have reviewed the patient's current medications.  Current Outpatient Medications  Medication Sig Dispense Refill  .  dexamethasone (DECADRON) 4 MG tablet Take 1 tablet (4 mg total) by mouth 2 (two) times daily. 4 mg twice daily on day before, day of and day after chemotherapy 30 tablet 0  . fenofibrate 160 MG tablet Take 160 mg by mouth at bedtime.    . lidocaine-prilocaine (EMLA) cream Apply 1 application topically as needed. 30 g 0  . OLANZapine (ZYPREXA) 10 MG tablet Take 0.5 tablets (5 mg total) by mouth at bedtime. 10 tablet 0  . omeprazole (PRILOSEC) 20 MG capsule Take 20 mg by mouth every morning.     . prochlorperazine (COMPAZINE) 10 MG tablet Take 1 tablet (10 mg total) by mouth every 6 (six) hours as needed for nausea or vomiting. 30 tablet 0  . simvastatin (ZOCOR) 40 MG tablet Take 40 mg by mouth at bedtime.    . tamsulosin (FLOMAX) 0.4 MG CAPS capsule Take 0.4 mg by mouth daily after breakfast.     No current facility-administered medications for this visit.    Facility-Administered Medications Ordered in Other Visits  Medication Dose Route Frequency Provider Last Rate Last Dose  . heparin lock flush 100 unit/mL  500 Units Intracatheter Once Wyatt Portela, MD         Allergies:  Allergies  Allergen Reactions  . Emend [Fosaprepitant Dimeglumine] Cough    Reaction during infusion with cough, erythema of face and neck.      Past Medical History, Surgical history, Social history, and Family History were reviewed and updated.    Physical Exam:  Blood pressure Marland Kitchen)  145/98, pulse 76, temperature 97.8 F (36.6 C), temperature source Oral, resp. rate 17, height 5\' 8"  (1.727 m), weight 169 lb (76.7 kg), SpO2 100 %.    ECOG: 0 General appearance: Alert, awake gentleman without distress. Head: Normocephalic without abnormalities. Oropharynx: No thrush or ulcers. Eyes: Pills are equal and round reactive to light. Lymph nodes: No cervical, supraclavicular, or axillary nodes enlargement. Heart: Regular rate without any murmurs or gallops Lung: clear in all lung fields without any rhonchi,  wheezes or dullness to percussion. Abdomin: soft without any rebound or guarding. Neurological: No motor or sensory deficits. Skin: No ecchymosis or petechiae. Musculoskeletal: No clubbing or cyanosis.  Full range of motion noted. Psychiatric: Mood and affect remains appropriate.    Lab Results: Lab Results  Component Value Date   WBC 2.6 (L) 02/10/2018   HGB 12.1 (L) 02/10/2018   HCT 36.0 (L) 02/10/2018   MCV 88.5 02/10/2018   PLT 179 02/10/2018     Chemistry      Component Value Date/Time   NA 138 02/10/2018 0940   K 4.7 02/10/2018 0940   CL 105 02/10/2018 0940   CO2 25 02/10/2018 0940   BUN 20 02/10/2018 0940   CREATININE 1.09 02/10/2018 0940   CREATININE 0.95 08/01/2011 1140      Component Value Date/Time   CALCIUM 9.5 02/10/2018 0940   ALKPHOS 78 02/10/2018 0940   AST 36 (H) 02/10/2018 0940   ALT 50 02/10/2018 0940   BILITOT 0.8 02/10/2018 0940       Impression and Plan:  77 year old man with:  1.    T2N0 high-grade urothelial carcinoma of the bladder diagnosed in March 2019 without any evidence of metastatic disease.    He is ready to start cycle 3 of neoadjuvant chemotherapy utilizing cisplatin and gemcitabine.  Risks and benefits of continuing chemotherapy was reviewed today is agreeable to continue.  His white cell count appears adequate so has his platelets.  Plan is to complete 4 cycles of therapy and we arrange for staging CT scan at the time.  He will require radical cystectomy afterwards which is the definitive approach at this time.  This was discussed with him today in detail he is agreeable with the plan.  2.  IV access: Port-A-Cath is in use for chemotherapy without complications.  3.  Antiemetics: No nausea or vomiting reported at this time.  He will use dexamethasone to prevent delayed nausea and vomiting.    4.  Renal function surveillance: Creatinine continues to be within normal range.  5.  Follow-up: We will be in 1 week for day 8  cycle 3.  25  minutes was spent with the patient face-to-face today.  More than 50% of time was dedicated to patient counseling, education and pronating his multifaceted care.   Zola Button, MD 5/15/20198:55 AM

## 2018-02-25 ENCOUNTER — Telehealth: Payer: Self-pay

## 2018-02-25 NOTE — Progress Notes (Signed)
Disability successfully faxed to [facility] at [phone]. Mailed copy to address on file.

## 2018-02-25 NOTE — Telephone Encounter (Signed)
Per 5/15 no los

## 2018-03-03 ENCOUNTER — Inpatient Hospital Stay: Payer: Medicare Other

## 2018-03-03 VITALS — BP 142/83 | HR 88 | Temp 97.9°F | Resp 16 | Wt 167.8 lb

## 2018-03-03 DIAGNOSIS — C671 Malignant neoplasm of dome of bladder: Secondary | ICD-10-CM

## 2018-03-03 DIAGNOSIS — C678 Malignant neoplasm of overlapping sites of bladder: Secondary | ICD-10-CM

## 2018-03-03 DIAGNOSIS — Z5111 Encounter for antineoplastic chemotherapy: Secondary | ICD-10-CM | POA: Diagnosis not present

## 2018-03-03 DIAGNOSIS — Z95828 Presence of other vascular implants and grafts: Secondary | ICD-10-CM

## 2018-03-03 DIAGNOSIS — C679 Malignant neoplasm of bladder, unspecified: Secondary | ICD-10-CM | POA: Diagnosis not present

## 2018-03-03 LAB — CBC WITH DIFFERENTIAL (CANCER CENTER ONLY)
BASOS PCT: 1 %
Basophils Absolute: 0 10*3/uL (ref 0.0–0.1)
EOS ABS: 0 10*3/uL (ref 0.0–0.5)
Eosinophils Relative: 1 %
HCT: 34.5 % — ABNORMAL LOW (ref 38.4–49.9)
Hemoglobin: 11.7 g/dL — ABNORMAL LOW (ref 13.0–17.1)
Lymphocytes Relative: 30 %
Lymphs Abs: 1.2 10*3/uL (ref 0.9–3.3)
MCH: 30.1 pg (ref 27.2–33.4)
MCHC: 34 g/dL (ref 32.0–36.0)
MCV: 88.6 fL (ref 79.3–98.0)
MONOS PCT: 4 %
Monocytes Absolute: 0.2 10*3/uL (ref 0.1–0.9)
NEUTROS PCT: 64 %
Neutro Abs: 2.5 10*3/uL (ref 1.5–6.5)
Platelet Count: 181 10*3/uL (ref 140–400)
RBC: 3.89 MIL/uL — AB (ref 4.20–5.82)
RDW: 15.4 % — ABNORMAL HIGH (ref 11.0–14.6)
WBC: 3.9 10*3/uL — AB (ref 4.0–10.3)

## 2018-03-03 LAB — CMP (CANCER CENTER ONLY)
ALBUMIN: 4.1 g/dL (ref 3.5–5.0)
ALT: 32 U/L (ref 0–55)
ANION GAP: 7 (ref 3–11)
AST: 24 U/L (ref 5–34)
Alkaline Phosphatase: 74 U/L (ref 40–150)
BILIRUBIN TOTAL: 0.9 mg/dL (ref 0.2–1.2)
BUN: 24 mg/dL (ref 7–26)
CALCIUM: 9.5 mg/dL (ref 8.4–10.4)
CO2: 25 mmol/L (ref 22–29)
CREATININE: 1.26 mg/dL (ref 0.70–1.30)
Chloride: 105 mmol/L (ref 98–109)
GFR, EST NON AFRICAN AMERICAN: 54 mL/min — AB (ref >60)
GFR, Est AFR Am: >60 mL/min (ref >60)
Glucose, Bld: 105 mg/dL (ref 70–140)
POTASSIUM: 4.9 mmol/L (ref 3.5–5.1)
Sodium: 137 mmol/L (ref 136–145)
Total Protein: 6.9 g/dL (ref 6.4–8.3)

## 2018-03-03 MED ORDER — SODIUM CHLORIDE 0.9% FLUSH
10.0000 mL | Freq: Once | INTRAVENOUS | Status: AC
  Administered 2018-03-03: 10 mL
  Filled 2018-03-03: qty 10

## 2018-03-03 MED ORDER — SODIUM CHLORIDE 0.9 % IV SOLN
Freq: Once | INTRAVENOUS | Status: AC
  Administered 2018-03-03: 13:00:00 via INTRAVENOUS

## 2018-03-03 MED ORDER — PROCHLORPERAZINE MALEATE 10 MG PO TABS
10.0000 mg | ORAL_TABLET | Freq: Once | ORAL | Status: AC
  Administered 2018-03-03: 10 mg via ORAL

## 2018-03-03 MED ORDER — HEPARIN SOD (PORK) LOCK FLUSH 100 UNIT/ML IV SOLN
500.0000 [IU] | Freq: Once | INTRAVENOUS | Status: AC | PRN
  Administered 2018-03-03: 500 [IU]
  Filled 2018-03-03: qty 5

## 2018-03-03 MED ORDER — SODIUM CHLORIDE 0.9 % IV SOLN: 1000.0000 mg/m2 | Freq: Once | INTRAVENOUS | Status: DC

## 2018-03-03 MED ORDER — SODIUM CHLORIDE 0.9% FLUSH
10.0000 mL | INTRAVENOUS | Status: DC | PRN
  Administered 2018-03-03: 10 mL
  Filled 2018-03-03: qty 10

## 2018-03-03 MED ORDER — SODIUM CHLORIDE 0.9 % IV SOLN
1000.0000 mg/m2 | Freq: Once | INTRAVENOUS | Status: AC
  Administered 2018-03-03: 1900 mg via INTRAVENOUS
  Filled 2018-03-03: qty 49.97

## 2018-03-03 MED ORDER — PROCHLORPERAZINE MALEATE 10 MG PO TABS
ORAL_TABLET | ORAL | Status: AC
  Filled 2018-03-03: qty 1

## 2018-03-03 NOTE — Patient Instructions (Signed)
[facility] °Discharge Instructions for Patients Receiving Chemotherapy ° °Today you received the following chemotherapy agents Gemzar ° °To help prevent nausea and vomiting after your treatment, we encourage you to take your nausea medication as directed. °  °If you develop nausea and vomiting that is not controlled by your nausea medication, call the clinic.  ° °BELOW ARE SYMPTOMS THAT SHOULD BE REPORTED IMMEDIATELY: °· *FEVER GREATER THAN 100.5 F °· *CHILLS WITH OR WITHOUT FEVER °· NAUSEA AND VOMITING THAT IS NOT CONTROLLED WITH YOUR NAUSEA MEDICATION °· *UNUSUAL SHORTNESS OF BREATH °· *UNUSUAL BRUISING OR BLEEDING °· TENDERNESS IN MOUTH AND THROAT WITH OR WITHOUT PRESENCE OF ULCERS °· *URINARY PROBLEMS °· *BOWEL PROBLEMS °· UNUSUAL RASH °Items with * indicate a potential emergency and should be followed up as soon as possible. ° °Feel free to call the clinic should you have any questions or concerns. The clinic phone number is [phone]. ° °Please show the CHEMO ALERT CARD at check-in to the Emergency Department and triage nurse. ° ° °

## 2018-03-17 ENCOUNTER — Inpatient Hospital Stay: Payer: Medicare Other

## 2018-03-17 ENCOUNTER — Inpatient Hospital Stay: Payer: Medicare Other | Attending: Oncology | Admitting: Oncology

## 2018-03-17 ENCOUNTER — Telehealth: Payer: Self-pay | Admitting: Oncology

## 2018-03-17 VITALS — BP 150/93

## 2018-03-17 VITALS — BP 152/95 | HR 80 | Temp 97.9°F | Resp 18 | Ht 68.0 in | Wt 171.9 lb

## 2018-03-17 DIAGNOSIS — Z5111 Encounter for antineoplastic chemotherapy: Secondary | ICD-10-CM | POA: Diagnosis not present

## 2018-03-17 DIAGNOSIS — C671 Malignant neoplasm of dome of bladder: Secondary | ICD-10-CM

## 2018-03-17 DIAGNOSIS — D6481 Anemia due to antineoplastic chemotherapy: Secondary | ICD-10-CM | POA: Diagnosis not present

## 2018-03-17 DIAGNOSIS — Z95828 Presence of other vascular implants and grafts: Secondary | ICD-10-CM

## 2018-03-17 DIAGNOSIS — T451X5A Adverse effect of antineoplastic and immunosuppressive drugs, initial encounter: Secondary | ICD-10-CM

## 2018-03-17 DIAGNOSIS — C678 Malignant neoplasm of overlapping sites of bladder: Secondary | ICD-10-CM

## 2018-03-17 DIAGNOSIS — C679 Malignant neoplasm of bladder, unspecified: Secondary | ICD-10-CM | POA: Insufficient documentation

## 2018-03-17 LAB — CMP (CANCER CENTER ONLY)
ALBUMIN: 4.3 g/dL (ref 3.5–5.0)
ALK PHOS: 67 U/L (ref 40–150)
ALT: 16 U/L (ref 0–55)
AST: 20 U/L (ref 5–34)
Anion gap: 10 (ref 3–11)
BILIRUBIN TOTAL: 0.8 mg/dL (ref 0.2–1.2)
BUN: 15 mg/dL (ref 7–26)
CO2: 23 mmol/L (ref 22–29)
CREATININE: 1.26 mg/dL (ref 0.70–1.30)
Calcium: 9.5 mg/dL (ref 8.4–10.4)
Chloride: 103 mmol/L (ref 98–109)
GFR, Est AFR Am: >60 mL/min (ref >60)
GFR, Estimated: 54 mL/min — ABNORMAL LOW (ref >60)
GLUCOSE: 148 mg/dL — AB (ref 70–140)
POTASSIUM: 4.6 mmol/L (ref 3.5–5.1)
Sodium: 136 mmol/L (ref 136–145)
TOTAL PROTEIN: 7 g/dL (ref 6.4–8.3)

## 2018-03-17 LAB — CBC WITH DIFFERENTIAL (CANCER CENTER ONLY)
BASOS ABS: 0 10*3/uL (ref 0.0–0.1)
Basophils Relative: 0 %
EOS PCT: 0 %
Eosinophils Absolute: 0 10*3/uL (ref 0.0–0.5)
HCT: 34.1 % — ABNORMAL LOW (ref 38.4–49.9)
Hemoglobin: 11.5 g/dL — ABNORMAL LOW (ref 13.0–17.1)
LYMPHS PCT: 9 %
Lymphs Abs: 0.4 10*3/uL — ABNORMAL LOW (ref 0.9–3.3)
MCH: 30.7 pg (ref 27.2–33.4)
MCHC: 33.6 g/dL (ref 32.0–36.0)
MCV: 91.4 fL (ref 79.3–98.0)
MONO ABS: 0.1 10*3/uL (ref 0.1–0.9)
Monocytes Relative: 3 %
Neutro Abs: 3.8 10*3/uL (ref 1.5–6.5)
Neutrophils Relative %: 88 %
PLATELETS: 255 10*3/uL (ref 140–400)
RBC: 3.73 MIL/uL — ABNORMAL LOW (ref 4.20–5.82)
RDW: 19.4 % — AB (ref 11.0–14.6)
WBC Count: 4.4 10*3/uL (ref 4.0–10.3)

## 2018-03-17 MED ORDER — DEXAMETHASONE SODIUM PHOSPHATE 10 MG/ML IJ SOLN
INTRAMUSCULAR | Status: AC
  Filled 2018-03-17: qty 1

## 2018-03-17 MED ORDER — HEPARIN SOD (PORK) LOCK FLUSH 100 UNIT/ML IV SOLN
500.0000 [IU] | Freq: Once | INTRAVENOUS | Status: AC | PRN
  Administered 2018-03-17: 500 [IU]
  Filled 2018-03-17: qty 5

## 2018-03-17 MED ORDER — SODIUM CHLORIDE 0.9% FLUSH
10.0000 mL | Freq: Once | INTRAVENOUS | Status: AC
  Administered 2018-03-17: 10 mL
  Filled 2018-03-17: qty 10

## 2018-03-17 MED ORDER — CISPLATIN CHEMO INJECTION 100MG/100ML
70.0000 mg/m2 | Freq: Once | INTRAVENOUS | Status: AC
  Administered 2018-03-17: 133 mg via INTRAVENOUS
  Filled 2018-03-17: qty 133

## 2018-03-17 MED ORDER — SODIUM CHLORIDE 0.9 % IV SOLN
1000.0000 mg/m2 | Freq: Once | INTRAVENOUS | Status: AC
  Administered 2018-03-17: 1900 mg via INTRAVENOUS
  Filled 2018-03-17: qty 50

## 2018-03-17 MED ORDER — DEXAMETHASONE SODIUM PHOSPHATE 10 MG/ML IJ SOLN
10.0000 mg | Freq: Once | INTRAMUSCULAR | Status: AC
  Administered 2018-03-17: 10 mg via INTRAVENOUS

## 2018-03-17 MED ORDER — SODIUM CHLORIDE 0.9 % IV SOLN
Freq: Once | INTRAVENOUS | Status: AC
  Administered 2018-03-17: 10:00:00 via INTRAVENOUS

## 2018-03-17 MED ORDER — PALONOSETRON HCL INJECTION 0.25 MG/5ML
INTRAVENOUS | Status: AC
  Filled 2018-03-17: qty 5

## 2018-03-17 MED ORDER — PALONOSETRON HCL INJECTION 0.25 MG/5ML
0.2500 mg | Freq: Once | INTRAVENOUS | Status: AC
  Administered 2018-03-17: 0.25 mg via INTRAVENOUS

## 2018-03-17 MED ORDER — SODIUM CHLORIDE 0.9% FLUSH
10.0000 mL | INTRAVENOUS | Status: DC | PRN
  Administered 2018-03-17: 10 mL
  Filled 2018-03-17: qty 10

## 2018-03-17 MED ORDER — POTASSIUM CHLORIDE 2 MEQ/ML IV SOLN
Freq: Once | INTRAVENOUS | Status: AC
  Administered 2018-03-17: 11:00:00 via INTRAVENOUS
  Filled 2018-03-17: qty 10

## 2018-03-17 NOTE — Patient Instructions (Signed)
[name] Discharge Instructions for Patients Receiving Chemotherapy  Today you received the following chemotherapy agents Cisplatin; Gemzar  To help prevent nausea and vomiting after your treatment, we encourage you to take your nausea medication as directed  If you develop nausea and vomiting that is not controlled by your nausea medication, call the clinic.   BELOW ARE SYMPTOMS THAT SHOULD BE REPORTED IMMEDIATELY:  *FEVER GREATER THAN 100.5 F  *CHILLS WITH OR WITHOUT FEVER  NAUSEA AND VOMITING THAT IS NOT CONTROLLED WITH YOUR NAUSEA MEDICATION  *UNUSUAL SHORTNESS OF BREATH  *UNUSUAL BRUISING OR BLEEDING  TENDERNESS IN MOUTH AND THROAT WITH OR WITHOUT PRESENCE OF ULCERS  *URINARY PROBLEMS  *BOWEL PROBLEMS  UNUSUAL RASH Items with * indicate a potential emergency and should be followed up as soon as possible.  Feel free to call the clinic should you have any questions or concerns. The clinic phone number is [phone].  Please show the [place] at check-in to the Emergency Department and triage nurse.

## 2018-03-17 NOTE — Telephone Encounter (Signed)
Scheduled appt per 6/5 los - Gave patient aVS and calender per los. Central radiology to contact patient with ct scan .

## 2018-03-17 NOTE — Progress Notes (Signed)
Hematology and Oncology Follow Up Visit  [name] [id number] 05/10/1941 77 y.o. 03/17/2018 9:19 AM [name], MDHall, Damon Areola, MD   Principle Diagnosis: 77 year old man with bladder cancer diagnosed in March 2019.  He was found to have T2N0 high-grade carcinoma on TURBT.  Prior Therapy:  He is status post TURBT on December 11, 2017.  The  pathology showed high-grade invasive urothelial carcinoma at the base of the bladder.   Current therapy: Neoadjuvant chemotherapy utilizing cisplatin and gemcitabine.  Cycle 1 started on January 13, 2018.  He is here for day 1 of cycle 4.  Interim History: [name] presents today for an evaluation.  He continues to tolerate chemotherapy without major complications.  He does report increased fatigue associated with cisplatin chemotherapy lasting close to 5 days.  He denies any nausea, vomiting or worsening neuropathy.  He is still able to function and attends to activities of daily living.  He denies any hearing deficits.  He does not report any flank pain or abdominal discomfort.  He denies any hematuria or change in his bowel habits.  He denies any infusion related complications of therapy.  He denies any issues associated with his Port-A-Cath usage.   He does not report any headaches, blurry vision, syncope or seizures.  He denies any neuropathy or alteration in mental status.  Does not report any fevers, chills or sweats.    His appetite and weight remain stable.  Does not report any cough, wheezing or hemoptysis.  Does not report any chest pain, palpitation, orthopnea or leg edema.  Does not report any nausea, vomiting or abdominal pain.  Does not report any hematochezia or melena. Does not report any bone pain or pathological fractures.  Does not report frequency, urgency or hematuria.  Does not report any skin rashes or lesions. Does not report any heat or cold intolerance.  Does not report any lymphadenopathy or petechiae.  Does not report any easy bruising.   He denies any anxiety or depression.  Remaining review of systems is negative.    Medications: I have reviewed the patient's current medications.  Current Outpatient Medications  Medication Sig Dispense Refill  . dexamethasone (DECADRON) 4 MG tablet Take 1 tablet (4 mg total) by mouth 2 (two) times daily. 4 mg twice daily on day before, day of and day after chemotherapy 30 tablet 0  . fenofibrate 160 MG tablet Take 160 mg by mouth at bedtime.    . lidocaine-prilocaine (EMLA) cream Apply 1 application topically as needed. 30 g 0  . omeprazole (PRILOSEC) 20 MG capsule Take 20 mg by mouth every morning.     . prochlorperazine (COMPAZINE) 10 MG tablet Take 1 tablet (10 mg total) by mouth every 6 (six) hours as needed for nausea or vomiting. 30 tablet 0  . simvastatin (ZOCOR) 40 MG tablet Take 40 mg by mouth at bedtime.    . tamsulosin (FLOMAX) 0.4 MG CAPS capsule Take 0.4 mg by mouth daily after breakfast.     No current facility-administered medications for this visit.      Allergies:  Allergies  Allergen Reactions  . Emend [Fosaprepitant Dimeglumine] Cough    Reaction during infusion with cough, erythema of face and neck.      Past Medical History, Surgical history, Social history, and Family History were reviewed and updated.    Physical Exam:  Blood pressure (!) 152/95, pulse 80, temperature 97.9 F (36.6 C), temperature source Oral, resp. rate 18, height 5\' 8"  (1.727  m), weight 171 lb 14.4 oz (78 kg), SpO2 99 %.   ECOG: 0 General appearance: Well-appearing gentleman without distress. Head: Atraumatic without abnormalities. Oropharynx: Week's membranes are moist and pink. Eyes: Sclera anicteric. Lymph nodes: No lymphadenopathy noted in the cervical, supraclavicular, inguinal or axillary nodes.  Heart: Regular rate without any murmurs or gallops.  No leg edema. Lung: clear to auscultation without any rhonchi, wheezes or dullness to percussion. Abdomin: Soft without any  shifting dullness or ascites.  Good bowel sounds without any rebound or guarding. Neurological: Intact motor, sensory and deep tendon reflexes. Skin: Moist without any rashes or lesions. Musculoskeletal: No joint deformity or effusion. Psychiatric: Appropriate mood and affect.    Lab Results: Lab Results  Component Value Date   WBC 3.9 (L) 03/03/2018   HGB 11.7 (L) 03/03/2018   HCT 34.5 (L) 03/03/2018   MCV 88.6 03/03/2018   PLT 181 03/03/2018     Chemistry      Component Value Date/Time   NA 137 03/03/2018 1143   K 4.9 03/03/2018 1143   CL 105 03/03/2018 1143   CO2 25 03/03/2018 1143   BUN 24 03/03/2018 1143   CREATININE 1.26 03/03/2018 1143   CREATININE 0.95 08/01/2011 1140      Component Value Date/Time   CALCIUM 9.5 03/03/2018 1143   ALKPHOS 74 03/03/2018 1143   AST 24 03/03/2018 1143   ALT 32 03/03/2018 1143   BILITOT 0.9 03/03/2018 1143       Impression and Plan:  77 year old man with:  1.    Bladder cancer diagnosed in March 2019.  He was found to have T2N0 high-grade urothelial carcinoma without any evidence of metastatic disease.    He is receiving neoadjuvant chemotherapy in preparation for radical cystectomy.  He completed 3 cycles of cisplatin and gemcitabine without complications.  Risks and benefits of proceeding with cycle 4 of chemotherapy was reviewed today.  Complications associated with cisplatin and gemcitabine were reiterated he is agreeable to proceed.  Plan is to complete his fourth and last cycle and repeat CT scan.  Continues to have disease-free scans, will proceed with radical cystectomy.  He has an appointment with Dr. Tresa Moore in the next few weeks.  Anticipate CT scan to be in July 2019 and his operation would be close to that date.  2.  IV access: Port-A-Cath remains in use without any issues or complications.  3.  Antiemetics: No nausea or vomiting reported at this time.  4.  Renal function surveillance: His kidney function remains  within normal range on cisplatin.  We will continue to monitor it moving forward.  5.  Anemia: Mild in nature and related to chemotherapy.  No transfusion or intervention is needed.  5.  Follow-up: We will be in 1 week for day 8 cycle 4.  He will have repeat imaging studies in the next 4 to 6 weeks.  25  minutes was spent with the patient face-to-face today.  More than 50% of time was dedicated to patient counseling, education and discussing and coordinating his future plan of care.   Zola Button, MD 6/5/20199:19 AM

## 2018-03-17 NOTE — Progress Notes (Signed)
Ok to treat per Dr. Alen Blew with elevated BP today.  Urine output of 489ml documented and orders released.

## 2018-03-24 ENCOUNTER — Inpatient Hospital Stay: Payer: Medicare Other

## 2018-03-24 VITALS — BP 163/94 | HR 72 | Temp 98.2°F | Resp 18 | Wt 169.2 lb

## 2018-03-24 DIAGNOSIS — C679 Malignant neoplasm of bladder, unspecified: Secondary | ICD-10-CM | POA: Diagnosis not present

## 2018-03-24 DIAGNOSIS — C671 Malignant neoplasm of dome of bladder: Secondary | ICD-10-CM

## 2018-03-24 DIAGNOSIS — C678 Malignant neoplasm of overlapping sites of bladder: Secondary | ICD-10-CM

## 2018-03-24 DIAGNOSIS — Z95828 Presence of other vascular implants and grafts: Secondary | ICD-10-CM

## 2018-03-24 DIAGNOSIS — Z5111 Encounter for antineoplastic chemotherapy: Secondary | ICD-10-CM | POA: Diagnosis not present

## 2018-03-24 LAB — CMP (CANCER CENTER ONLY)
ALT: 28 U/L (ref 0–55)
ANION GAP: 8 (ref 3–11)
AST: 27 U/L (ref 5–34)
Albumin: 4.3 g/dL (ref 3.5–5.0)
Alkaline Phosphatase: 67 U/L (ref 40–150)
BUN: 25 mg/dL (ref 7–26)
CHLORIDE: 102 mmol/L (ref 98–109)
CO2: 26 mmol/L (ref 22–29)
Calcium: 9.4 mg/dL (ref 8.4–10.4)
Creatinine: 1.21 mg/dL (ref 0.70–1.30)
GFR, Estimated: 57 mL/min — ABNORMAL LOW (ref >60)
Glucose, Bld: 96 mg/dL (ref 70–140)
Potassium: 5 mmol/L (ref 3.5–5.1)
SODIUM: 136 mmol/L (ref 136–145)
Total Bilirubin: 1 mg/dL (ref 0.2–1.2)
Total Protein: 6.8 g/dL (ref 6.4–8.3)

## 2018-03-24 LAB — CBC WITH DIFFERENTIAL (CANCER CENTER ONLY)
BASOS ABS: 0 10*3/uL (ref 0.0–0.1)
Basophils Relative: 0 %
EOS PCT: 1 %
Eosinophils Absolute: 0 10*3/uL (ref 0.0–0.5)
HCT: 32.5 % — ABNORMAL LOW (ref 38.4–49.9)
Hemoglobin: 11.2 g/dL — ABNORMAL LOW (ref 13.0–17.1)
Lymphocytes Relative: 34 %
Lymphs Abs: 1 10*3/uL (ref 0.9–3.3)
MCH: 31 pg (ref 27.2–33.4)
MCHC: 34.5 g/dL (ref 32.0–36.0)
MCV: 90 fL (ref 79.3–98.0)
Monocytes Absolute: 0.1 10*3/uL (ref 0.1–0.9)
Monocytes Relative: 4 %
Neutro Abs: 1.8 10*3/uL (ref 1.5–6.5)
Neutrophils Relative %: 61 %
PLATELETS: 181 10*3/uL (ref 140–400)
RBC: 3.62 MIL/uL — AB (ref 4.20–5.82)
RDW: 18.1 % — ABNORMAL HIGH (ref 11.0–14.6)
WBC: 3 10*3/uL — AB (ref 4.0–10.3)

## 2018-03-24 MED ORDER — HEPARIN SOD (PORK) LOCK FLUSH 100 UNIT/ML IV SOLN
500.0000 [IU] | Freq: Once | INTRAVENOUS | Status: AC | PRN
  Administered 2018-03-24: 500 [IU]
  Filled 2018-03-24: qty 5

## 2018-03-24 MED ORDER — SODIUM CHLORIDE 0.9% FLUSH
10.0000 mL | INTRAVENOUS | Status: DC | PRN
  Administered 2018-03-24: 10 mL
  Filled 2018-03-24: qty 10

## 2018-03-24 MED ORDER — PROCHLORPERAZINE MALEATE 10 MG PO TABS
10.0000 mg | ORAL_TABLET | Freq: Once | ORAL | Status: AC
  Administered 2018-03-24: 10 mg via ORAL

## 2018-03-24 MED ORDER — SODIUM CHLORIDE 0.9 % IV SOLN
1000.0000 mg/m2 | Freq: Once | INTRAVENOUS | Status: AC
  Administered 2018-03-24: 1900 mg via INTRAVENOUS
  Filled 2018-03-24: qty 49.97

## 2018-03-24 MED ORDER — SODIUM CHLORIDE 0.9 % IV SOLN
Freq: Once | INTRAVENOUS | Status: AC
  Administered 2018-03-24: 10:00:00 via INTRAVENOUS

## 2018-03-24 MED ORDER — SODIUM CHLORIDE 0.9% FLUSH
10.0000 mL | Freq: Once | INTRAVENOUS | Status: AC
  Administered 2018-03-24: 10 mL
  Filled 2018-03-24: qty 10

## 2018-03-24 MED ORDER — PROCHLORPERAZINE MALEATE 10 MG PO TABS
ORAL_TABLET | ORAL | Status: AC
  Filled 2018-03-24: qty 1

## 2018-03-24 NOTE — Patient Instructions (Signed)
[facility] °Discharge Instructions for Patients Receiving Chemotherapy ° °Today you received the following chemotherapy agents Gemzar ° °To help prevent nausea and vomiting after your treatment, we encourage you to take your nausea medication as directed. °  °If you develop nausea and vomiting that is not controlled by your nausea medication, call the clinic.  ° °BELOW ARE SYMPTOMS THAT SHOULD BE REPORTED IMMEDIATELY: °· *FEVER GREATER THAN 100.5 F °· *CHILLS WITH OR WITHOUT FEVER °· NAUSEA AND VOMITING THAT IS NOT CONTROLLED WITH YOUR NAUSEA MEDICATION °· *UNUSUAL SHORTNESS OF BREATH °· *UNUSUAL BRUISING OR BLEEDING °· TENDERNESS IN MOUTH AND THROAT WITH OR WITHOUT PRESENCE OF ULCERS °· *URINARY PROBLEMS °· *BOWEL PROBLEMS °· UNUSUAL RASH °Items with * indicate a potential emergency and should be followed up as soon as possible. ° °Feel free to call the clinic should you have any questions or concerns. The clinic phone number is [phone]. ° °Please show the CHEMO ALERT CARD at check-in to the Emergency Department and triage nurse. ° ° °

## 2018-03-30 DIAGNOSIS — R972 Elevated prostate specific antigen [PSA]: Secondary | ICD-10-CM | POA: Diagnosis not present

## 2018-03-30 DIAGNOSIS — C670 Malignant neoplasm of trigone of bladder: Secondary | ICD-10-CM | POA: Diagnosis not present

## 2018-03-30 DIAGNOSIS — N183 Chronic kidney disease, stage 3 (moderate): Secondary | ICD-10-CM | POA: Diagnosis not present

## 2018-04-01 ENCOUNTER — Other Ambulatory Visit: Payer: Self-pay | Admitting: Urology

## 2018-04-20 ENCOUNTER — Encounter (HOSPITAL_COMMUNITY): Payer: Self-pay

## 2018-04-20 ENCOUNTER — Inpatient Hospital Stay: Payer: Medicare Other

## 2018-04-20 ENCOUNTER — Inpatient Hospital Stay: Payer: Medicare Other | Attending: Oncology

## 2018-04-20 ENCOUNTER — Ambulatory Visit (HOSPITAL_COMMUNITY)
Admission: RE | Admit: 2018-04-20 | Discharge: 2018-04-20 | Disposition: A | Discharge status: Home / Self Care | Payer: Medicare Other | Source: Ambulatory Visit | Attending: Oncology | Admitting: Oncology

## 2018-04-20 DIAGNOSIS — Z9221 Personal history of antineoplastic chemotherapy: Secondary | ICD-10-CM | POA: Insufficient documentation

## 2018-04-20 DIAGNOSIS — N400 Enlarged prostate without lower urinary tract symptoms: Secondary | ICD-10-CM | POA: Insufficient documentation

## 2018-04-20 DIAGNOSIS — Z8551 Personal history of malignant neoplasm of bladder: Secondary | ICD-10-CM | POA: Insufficient documentation

## 2018-04-20 DIAGNOSIS — Z95828 Presence of other vascular implants and grafts: Secondary | ICD-10-CM

## 2018-04-20 DIAGNOSIS — I700 Atherosclerosis of aorta: Secondary | ICD-10-CM | POA: Diagnosis not present

## 2018-04-20 DIAGNOSIS — I2510 Atherosclerotic heart disease of native coronary artery without angina pectoris: Secondary | ICD-10-CM | POA: Diagnosis not present

## 2018-04-20 DIAGNOSIS — C671 Malignant neoplasm of dome of bladder: Secondary | ICD-10-CM | POA: Diagnosis not present

## 2018-04-20 LAB — CBC WITH DIFFERENTIAL (CANCER CENTER ONLY)
Basophils Absolute: 0 10*3/uL (ref 0.0–0.1)
Basophils Relative: 0 %
Eosinophils Absolute: 0.1 10*3/uL (ref 0.0–0.5)
Eosinophils Relative: 1 %
HEMATOCRIT: 38.2 % — AB (ref 38.4–49.9)
HEMOGLOBIN: 12.4 g/dL — AB (ref 13.0–17.1)
LYMPHS ABS: 1.1 10*3/uL (ref 0.9–3.3)
LYMPHS PCT: 23 %
MCH: 31.4 pg (ref 27.2–33.4)
MCHC: 32.5 g/dL (ref 32.0–36.0)
MCV: 96.7 fL (ref 79.3–98.0)
MONOS PCT: 13 %
Monocytes Absolute: 0.6 10*3/uL (ref 0.1–0.9)
NEUTROS PCT: 63 %
Neutro Abs: 3.1 10*3/uL (ref 1.5–6.5)
Platelet Count: 161 10*3/uL (ref 140–400)
RBC: 3.95 MIL/uL — ABNORMAL LOW (ref 4.20–5.82)
RDW: 15.5 % — ABNORMAL HIGH (ref 11.0–14.6)
WBC Count: 4.9 10*3/uL (ref 4.0–10.3)

## 2018-04-20 LAB — CMP (CANCER CENTER ONLY)
ALT: 21 U/L (ref 0–44)
AST: 25 U/L (ref 15–41)
Albumin: 4.5 g/dL (ref 3.5–5.0)
Alkaline Phosphatase: 65 U/L (ref 38–126)
Anion gap: 6 (ref 5–15)
BUN: 21 mg/dL (ref 8–23)
CHLORIDE: 105 mmol/L (ref 98–111)
CO2: 27 mmol/L (ref 22–32)
CREATININE: 1.38 mg/dL — AB (ref 0.61–1.24)
Calcium: 9.5 mg/dL (ref 8.9–10.3)
GFR, EST AFRICAN AMERICAN: 57 mL/min — AB (ref >60)
GFR, Estimated: 49 mL/min — ABNORMAL LOW (ref >60)
Glucose, Bld: 101 mg/dL — ABNORMAL HIGH (ref 70–99)
Potassium: 4.7 mmol/L (ref 3.5–5.1)
SODIUM: 138 mmol/L (ref 135–145)
Total Bilirubin: 1.3 mg/dL — ABNORMAL HIGH (ref 0.3–1.2)
Total Protein: 7 g/dL (ref 6.5–8.1)

## 2018-04-20 MED ORDER — SODIUM CHLORIDE 0.9% FLUSH
10.0000 mL | Freq: Once | INTRAVENOUS | Status: AC
  Administered 2018-04-20: 10 mL
  Filled 2018-04-20: qty 10

## 2018-04-20 MED ORDER — HEPARIN SOD (PORK) LOCK FLUSH 100 UNIT/ML IV SOLN
INTRAVENOUS | Status: AC
  Filled 2018-04-20: qty 5

## 2018-04-20 MED ORDER — IOPAMIDOL (ISOVUE-300) INJECTION 61%
INTRAVENOUS | Status: AC
  Filled 2018-04-20: qty 100

## 2018-04-20 MED ORDER — HEPARIN SOD (PORK) LOCK FLUSH 100 UNIT/ML IV SOLN
500.0000 [IU] | Freq: Once | INTRAVENOUS | Status: AC
  Administered 2018-04-20: 500 [IU] via INTRAVENOUS

## 2018-04-20 MED ORDER — SODIUM CHLORIDE 0.9 % IV SOLN
INTRAVENOUS | Status: AC
  Filled 2018-04-20: qty 250

## 2018-04-20 MED ORDER — IOPAMIDOL (ISOVUE-300) INJECTION 61%
100.0000 mL | Freq: Once | INTRAVENOUS | Status: AC | PRN
  Administered 2018-04-20: 100 mL via INTRAVENOUS

## 2018-04-21 ENCOUNTER — Encounter: Payer: Self-pay | Admitting: Oncology

## 2018-04-21 ENCOUNTER — Inpatient Hospital Stay: Payer: Medicare Other | Admitting: Oncology

## 2018-04-21 VITALS — BP 171/102 | HR 77 | Temp 97.8°F | Resp 18 | Ht 68.0 in | Wt 168.5 lb

## 2018-04-21 DIAGNOSIS — Z8551 Personal history of malignant neoplasm of bladder: Secondary | ICD-10-CM | POA: Diagnosis not present

## 2018-04-21 DIAGNOSIS — C671 Malignant neoplasm of dome of bladder: Secondary | ICD-10-CM | POA: Diagnosis not present

## 2018-04-21 DIAGNOSIS — Z9221 Personal history of antineoplastic chemotherapy: Secondary | ICD-10-CM | POA: Diagnosis not present

## 2018-04-21 NOTE — Progress Notes (Signed)
Hematology and Oncology Follow Up Visit  [name] [id number] 04/24/1941 77 y.o. 04/21/2018 10:12 AM [name], MDHall, Edwinna Areola, MD   Principle Diagnosis: 77 year old man with T2N0 bladder cancer diagnosed in March 2019.  His pathology showed a high-grade urothelial carcinoma.  Prior Therapy:  He is status post TURBT on December 11, 2017.  The  pathology showed high-grade invasive urothelial carcinoma at the base of the bladder.   He is status post 4 cycles of neoadjuvant chemotherapy utilizing cisplatin and gemcitabine concluded on 03/24/2018.  Current therapy: Active surveillance under consideration for radical cystectomy.  Interim History: [name] is here for a follow-up.  Since the last visit, he completed therapy for his bladder cancer receiving neoadjuvant gemcitabine and cisplatin without complications.  He has resumed all activities of daily living without any decline in his ability to do so.  He denies any worsening neuropathy or hearing deficits.  He does report mild fatigue which has resolved at this time.  He denies any hematuria or dysuria.  He does not report any headaches, blurry vision, syncope or seizures.  He reports no confusions or falls.  Does not report any fevers, chills or sweats. Does not report any cough, wheezing or hemoptysis.  Does not report any chest pain, palpitation, orthopnea or leg edema.  Does not report any nausea, vomiting or abdominal pain.  He denied any constipation or diarrhea. Does not report any bone pain or pathological fractures.  Does not report frequency, urgency or hematuria.  Does not report any skin rashes or lesions. .  Does not report any lymphadenopathy or petechiae.  Does not report any easy bruising.  He denies any mood disorder. Remaining review of systems is negative.    Medications: I have reviewed the patient's current medications.  Current Outpatient Medications  Medication Sig Dispense Refill  . dexamethasone (DECADRON) 4 MG  tablet Take 1 tablet (4 mg total) by mouth 2 (two) times daily. 4 mg twice daily on day before, day of and day after chemotherapy 30 tablet 0  . fenofibrate 160 MG tablet Take 160 mg by mouth at bedtime.    . lidocaine-prilocaine (EMLA) cream Apply 1 application topically as needed. 30 g 0  . omeprazole (PRILOSEC) 20 MG capsule Take 20 mg by mouth every morning.     . prochlorperazine (COMPAZINE) 10 MG tablet Take 1 tablet (10 mg total) by mouth every 6 (six) hours as needed for nausea or vomiting. 30 tablet 0  . simvastatin (ZOCOR) 40 MG tablet Take 40 mg by mouth at bedtime.    . tamsulosin (FLOMAX) 0.4 MG CAPS capsule Take 0.4 mg by mouth daily after breakfast.     No current facility-administered medications for this visit.      Allergies:  Allergies  Allergen Reactions  . Emend [Fosaprepitant Dimeglumine] Cough    Reaction during infusion with cough, erythema of face and neck.      Past Medical History, Surgical history, Social history, and Family History were reviewed and updated.    Physical Exam:   Blood pressure (!) 171/102, pulse 77, temperature 97.8 F (36.6 C), temperature source Oral, resp. rate 18, height 5\' 8"  (1.727 m), weight 168 lb 8 oz (76.4 kg), SpO2 99 %.    ECOG: 0 General appearance: Alert, awake gentleman without distress. Head: Normocephalic without abnormalities. Oropharynx: No oral thrush or ulcers. Eyes: Pupils are equal and round reactive to light. Lymph nodes: No  cervical, supraclavicular, inguinal or axillary lymphadenopathy. Heart: Regular  rate without any murmurs or gallops. Lung: clear without any rhonchi, wheezes or dullness to percussion.. Abdomin: Soft without any rebound or guarding.  Good bowel sounds. Neurological: No motor or sensory deficits. Skin: No ecchymosis or petechiae. Musculoskeletal: Clubbing or cyanosis.     Lab Results: Lab Results  Component Value Date   WBC 4.9 04/20/2018   HGB 12.4 (L) 04/20/2018   HCT 38.2  (L) 04/20/2018   MCV 96.7 04/20/2018   PLT 161 04/20/2018     Chemistry      Component Value Date/Time   NA 138 04/20/2018 0957   K 4.7 04/20/2018 0957   CL 105 04/20/2018 0957   CO2 27 04/20/2018 0957   BUN 21 04/20/2018 0957   CREATININE 1.38 (H) 04/20/2018 0957   CREATININE 0.95 08/01/2011 1140      Component Value Date/Time   CALCIUM 9.5 04/20/2018 0957   ALKPHOS 65 04/20/2018 0957   AST 25 04/20/2018 0957   ALT 21 04/20/2018 0957   BILITOT 1.3 (H) 04/20/2018 0957     EXAM: CT CHEST, ABDOMEN, AND PELVIS WITH CONTRAST  TECHNIQUE: Multidetector CT imaging of the chest, abdomen and pelvis was performed following the standard protocol during bolus administration of intravenous contrast.  CONTRAST:  160mL ISOVUE-300 IOPAMIDOL (ISOVUE-300) INJECTION 61%  COMPARISON:  CT 11/27/2017  FINDINGS: CT CHEST FINDINGS  Cardiovascular: Port in the anterior chest wall with tip in distal SVC. Coronary artery calcification and aortic atherosclerotic calcification.  Mediastinum/Nodes: No axillary or supraclavicular adenopathy. No mediastinal effusion. Esophagus normal.  Lungs/Pleura: No suspicious pulmonary nodules. Airways normal. Pleural surface normal.  Musculoskeletal: No aggressive osseous lesion.  CT ABDOMEN AND PELVIS FINDINGS  Hepatobiliary: No focal hepatic lesion. No biliary ductal dilatation. Gallbladder is normal. Common bile duct is normal.  Pancreas: Pancreas is normal. No ductal dilatation. No pancreatic inflammation.  Spleen: Normal spleen  Adrenals/urinary tract: Adrenal glands and kidneys normal. Ureters normal.  Nodularity at the base the bladder presumably related to prostate hypertrophy (image 103/2). No enhancing bladder lesion. No filling defect in the bladder other than the prostate enlargement.  Stomach/Bowel: Stomach, small-bowel and cecum are normal. The appendix is not identified but there is no pericecal inflammation  to suggest appendicitis. The colon and rectosigmoid colon are normal.  Vascular/Lymphatic: Abdominal aorta is normal caliber with atherosclerotic calcification. There is no retroperitoneal or periportal lymphadenopathy. No pelvic lymphadenopathy.  Small 6 mm LEFT obturator lymph node unchanged.  Reproductive: Enlarged prostate again noted measuring 63 mm diameter. Nodular extends to the base the prostate gland  Other: . no peritoneal disease.  Musculoskeletal: No aggressive osseous lesion.  IMPRESSION: Chest Impression:  1. No thoracic metastasis. 2. Coronary artery calcification and Aortic Atherosclerosis (ICD10-I70.0).  Abdomen / Pelvis Impression:  1. Removal of bladder mass. 2. Enlarged prostate gland with nodular extension into the base of bladder. 3. No metastatic disease in the abdomen pelvis. 4.  Atherosclerotic calcification of the aorta.   Impression and Plan:  77 year old man with:  1.    T2N0 high-grade urothelial carcinoma of the bladder diagnosed in March 2019.    He completed 4 cycles of neoadjuvant chemotherapy without any complications.  CT scan obtained on 04/20/2018 was personally reviewed today and showed no evidence of recurrent disease.  The natural course of this disease was reviewed today as well as future treatment options.  He is scheduled to have a radical cystectomy tentatively in August 2019.  I encouraged him to proceed with this approach as this is the most  definitive and curative option.  He will proceed with this operation and follow-up after that to discuss any additional therapy if needed.  2.  IV access: Port-A-Cath will be removed after his surgery and will be flushed periodically at this time.   3.  Renal function surveillance: His creatinine is close to baseline after completing cisplatin therapy.  4.  Anemia: Resolved at this time related to chemotherapy.  5.  Follow-up: We will be in 3 months after completing his  surgery to discuss postoperative surveillance.  15  minutes was spent with the patient face-to-face today.  More than 50% of time was dedicated to patient counseling, education and answering questions regarding his diagnosis, prognosis and future plan of care.Zola Button, MD 7/10/201910:12 AM

## 2018-05-04 DIAGNOSIS — E875 Hyperkalemia: Secondary | ICD-10-CM | POA: Diagnosis not present

## 2018-05-04 DIAGNOSIS — E782 Mixed hyperlipidemia: Secondary | ICD-10-CM | POA: Diagnosis not present

## 2018-05-04 DIAGNOSIS — R944 Abnormal results of kidney function studies: Secondary | ICD-10-CM | POA: Diagnosis not present

## 2018-05-04 DIAGNOSIS — R7301 Impaired fasting glucose: Secondary | ICD-10-CM | POA: Diagnosis not present

## 2018-05-04 DIAGNOSIS — E291 Testicular hypofunction: Secondary | ICD-10-CM | POA: Diagnosis not present

## 2018-05-04 DIAGNOSIS — K219 Gastro-esophageal reflux disease without esophagitis: Secondary | ICD-10-CM | POA: Diagnosis not present

## 2018-05-04 DIAGNOSIS — S76091D Other specified injury of muscle, fascia and tendon of right hip, subsequent encounter: Secondary | ICD-10-CM | POA: Diagnosis not present

## 2018-05-06 DIAGNOSIS — R7301 Impaired fasting glucose: Secondary | ICD-10-CM | POA: Diagnosis not present

## 2018-05-06 DIAGNOSIS — R972 Elevated prostate specific antigen [PSA]: Secondary | ICD-10-CM | POA: Diagnosis not present

## 2018-05-06 DIAGNOSIS — C679 Malignant neoplasm of bladder, unspecified: Secondary | ICD-10-CM | POA: Diagnosis not present

## 2018-05-06 DIAGNOSIS — R944 Abnormal results of kidney function studies: Secondary | ICD-10-CM | POA: Diagnosis not present

## 2018-05-06 DIAGNOSIS — K219 Gastro-esophageal reflux disease without esophagitis: Secondary | ICD-10-CM | POA: Diagnosis not present

## 2018-05-10 DIAGNOSIS — C670 Malignant neoplasm of trigone of bladder: Secondary | ICD-10-CM | POA: Diagnosis not present

## 2018-05-10 DIAGNOSIS — R3915 Urgency of urination: Secondary | ICD-10-CM | POA: Diagnosis not present

## 2018-06-10 ENCOUNTER — Inpatient Hospital Stay (HOSPITAL_COMMUNITY)
Admission: RE | Admit: 2018-06-10 | Discharge: 2018-06-16 | DRG: 654 | Disposition: A | Discharge status: Home Health | Payer: Medicare Other | Source: Ambulatory Visit | Attending: Urology | Admitting: Urology

## 2018-06-10 ENCOUNTER — Inpatient Hospital Stay (HOSPITAL_COMMUNITY): Admission: RE | Admit: 2018-06-10 | Payer: Medicare Other | Source: Ambulatory Visit | Admitting: Urology

## 2018-06-10 ENCOUNTER — Other Ambulatory Visit: Payer: Self-pay

## 2018-06-10 ENCOUNTER — Encounter (HOSPITAL_COMMUNITY): Payer: Self-pay

## 2018-06-10 DIAGNOSIS — Z800 Family history of malignant neoplasm of digestive organs: Secondary | ICD-10-CM

## 2018-06-10 DIAGNOSIS — E7800 Pure hypercholesterolemia, unspecified: Secondary | ICD-10-CM | POA: Diagnosis not present

## 2018-06-10 DIAGNOSIS — K567 Ileus, unspecified: Secondary | ICD-10-CM | POA: Diagnosis not present

## 2018-06-10 DIAGNOSIS — K219 Gastro-esophageal reflux disease without esophagitis: Secondary | ICD-10-CM | POA: Diagnosis not present

## 2018-06-10 DIAGNOSIS — C670 Malignant neoplasm of trigone of bladder: Secondary | ICD-10-CM | POA: Diagnosis not present

## 2018-06-10 DIAGNOSIS — E785 Hyperlipidemia, unspecified: Secondary | ICD-10-CM | POA: Diagnosis present

## 2018-06-10 DIAGNOSIS — D090 Carcinoma in situ of bladder: Secondary | ICD-10-CM | POA: Diagnosis not present

## 2018-06-10 DIAGNOSIS — N289 Disorder of kidney and ureter, unspecified: Secondary | ICD-10-CM | POA: Diagnosis not present

## 2018-06-10 DIAGNOSIS — Z9221 Personal history of antineoplastic chemotherapy: Secondary | ICD-10-CM

## 2018-06-10 DIAGNOSIS — N401 Enlarged prostate with lower urinary tract symptoms: Secondary | ICD-10-CM | POA: Diagnosis present

## 2018-06-10 DIAGNOSIS — Z87891 Personal history of nicotine dependence: Secondary | ICD-10-CM | POA: Diagnosis not present

## 2018-06-10 DIAGNOSIS — C679 Malignant neoplasm of bladder, unspecified: Principal | ICD-10-CM | POA: Diagnosis present

## 2018-06-10 LAB — COMPREHENSIVE METABOLIC PANEL
ALK PHOS: 55 U/L (ref 38–126)
ALT: 18 U/L (ref 0–44)
AST: 24 U/L (ref 15–41)
Albumin: 4.6 g/dL (ref 3.5–5.0)
Anion gap: 9 (ref 5–15)
BILIRUBIN TOTAL: 1.3 mg/dL — AB (ref 0.3–1.2)
BUN: 27 mg/dL — AB (ref 8–23)
CALCIUM: 9.5 mg/dL (ref 8.9–10.3)
CO2: 27 mmol/L (ref 22–32)
CREATININE: 1.36 mg/dL — AB (ref 0.61–1.24)
Chloride: 104 mmol/L (ref 98–111)
GFR calc Af Amer: 57 mL/min — ABNORMAL LOW (ref >60)
GFR, EST NON AFRICAN AMERICAN: 49 mL/min — AB (ref >60)
GLUCOSE: 98 mg/dL (ref 70–99)
Potassium: 4.4 mmol/L (ref 3.5–5.1)
Sodium: 140 mmol/L (ref 135–145)
TOTAL PROTEIN: 7.6 g/dL (ref 6.5–8.1)

## 2018-06-10 LAB — CBC
HEMATOCRIT: 40.9 % (ref 39.0–52.0)
HEMOGLOBIN: 13.5 g/dL (ref 13.0–17.0)
MCH: 30.5 pg (ref 26.0–34.0)
MCHC: 33 g/dL (ref 30.0–36.0)
MCV: 92.5 fL (ref 78.0–100.0)
Platelets: 180 10*3/uL (ref 150–400)
RBC: 4.42 MIL/uL (ref 4.22–5.81)
RDW: 12.6 % (ref 11.5–15.5)
WBC: 5.1 10*3/uL (ref 4.0–10.5)

## 2018-06-10 LAB — SURGICAL PCR SCREEN
MRSA, PCR: NEGATIVE
Staphylococcus aureus: POSITIVE — AB

## 2018-06-10 MED ORDER — PEG 3350-KCL-NA BICARB-NACL 420 G PO SOLR
4000.0000 mL | Freq: Once | ORAL | Status: AC
  Administered 2018-06-10: 4000 mL via ORAL

## 2018-06-10 MED ORDER — METRONIDAZOLE 500 MG PO TABS
500.0000 mg | ORAL_TABLET | Freq: Four times a day (QID) | ORAL | Status: AC
  Administered 2018-06-10 (×3): 500 mg via ORAL
  Filled 2018-06-10 (×3): qty 1

## 2018-06-10 MED ORDER — PANTOPRAZOLE SODIUM 40 MG PO TBEC
40.0000 mg | DELAYED_RELEASE_TABLET | Freq: Every day | ORAL | Status: DC
  Administered 2018-06-10 – 2018-06-16 (×6): 40 mg via ORAL
  Filled 2018-06-10 (×6): qty 1

## 2018-06-10 MED ORDER — FENOFIBRATE 160 MG PO TABS
160.0000 mg | ORAL_TABLET | Freq: Every day | ORAL | Status: DC
  Administered 2018-06-10 – 2018-06-15 (×6): 160 mg via ORAL
  Filled 2018-06-10 (×6): qty 1

## 2018-06-10 MED ORDER — SODIUM CHLORIDE 0.9% FLUSH: 10.0000 mL | INTRAVENOUS | Status: DC | PRN

## 2018-06-10 MED ORDER — NEOMYCIN SULFATE 500 MG PO TABS
500.0000 mg | ORAL_TABLET | Freq: Four times a day (QID) | ORAL | Status: AC
  Administered 2018-06-10 (×3): 500 mg via ORAL
  Filled 2018-06-10 (×3): qty 1

## 2018-06-10 MED ORDER — SODIUM CHLORIDE 0.9 % IV SOLN
INTRAVENOUS | Status: DC
  Administered 2018-06-10 – 2018-06-14 (×4): via INTRAVENOUS

## 2018-06-10 MED ORDER — PIPERACILLIN-TAZOBACTAM 3.375 G IVPB 30 MIN
3.3750 g | INTRAVENOUS | Status: AC
  Administered 2018-06-11: 3.375 g via INTRAVENOUS
  Filled 2018-06-10 (×2): qty 50

## 2018-06-10 MED ORDER — SIMVASTATIN 40 MG PO TABS
40.0000 mg | ORAL_TABLET | Freq: Every day | ORAL | Status: DC
  Administered 2018-06-10 – 2018-06-15 (×6): 40 mg via ORAL
  Filled 2018-06-10 (×6): qty 1

## 2018-06-10 MED ORDER — ALVIMOPAN 12 MG PO CAPS
12.0000 mg | ORAL_CAPSULE | ORAL | Status: AC
  Administered 2018-06-11: 12 mg via ORAL
  Filled 2018-06-10: qty 1

## 2018-06-10 NOTE — Anesthesia Preprocedure Evaluation (Addendum)
Anesthesia Evaluation  Patient identified by MRN, date of birth, ID band Patient awake    Reviewed: Allergy & Precautions, NPO status , Patient's Chart, lab work & pertinent test results  Airway Mallampati: II  TM Distance: >3 FB Neck ROM: Full    Dental  (+) Upper Dentures, Lower Dentures, Edentulous Upper, Edentulous Lower, Dental Advisory Given   Pulmonary former smoker,    Pulmonary exam normal breath sounds clear to auscultation       Cardiovascular negative cardio ROS Normal cardiovascular exam Rhythm:Regular Rate:Normal     Neuro/Psych negative psych ROS   GI/Hepatic Neg liver ROS, hiatal hernia, GERD  Medicated and Controlled,  Endo/Other  negative endocrine ROS  Renal/GU negative Renal ROS     Musculoskeletal negative musculoskeletal ROS (+)   Abdominal   Peds  Hematology negative hematology ROS (+)   Anesthesia Other Findings   Reproductive/Obstetrics negative OB ROS                            Anesthesia Physical  Anesthesia Plan  ASA: II  Anesthesia Plan: General   Post-op Pain Management:    Induction: Intravenous  PONV Risk Score and Plan: 2 and Ondansetron, Treatment may vary due to age or medical condition and Dexamethasone  Airway Management Planned: Oral ETT  Additional Equipment: Arterial line  Intra-op Plan:   Post-operative Plan: Extubation in OR  Informed Consent: I have reviewed the patients History and Physical, chart, labs and discussed the procedure including the risks, benefits and alternatives for the proposed anesthesia with the patient or authorized representative who has indicated his/her understanding and acceptance.   Dental advisory given  Plan Discussed with: CRNA  Anesthesia Plan Comments: (2 x PIV)      Anesthesia Quick Evaluation

## 2018-06-10 NOTE — Consult Note (Signed)
[place] Nurse requested for preoperative stoma site marking  Discussed surgical procedure and stoma creation with patient and family. Wife at bedside  Explained role of the [facility] nurse team.  Provided the patient with educational booklet and provided samples of pouching options.  Answered patient and family questions.   Examined patient lying, sitting, and standing in order to place the marking in the patient's visual field, away from any creases or abdominal contour issues and within the rectus muscle.   Marked for ileal conduit in the RMQ  3   cm to the right of the umbilicus and  3  cm above the umbilicus. Very low lying umbilicus and RLQ created difficulty visualizing stoma when sitting up    Patient's abdomen cleansed with CHG wipes at site markings, allowed to air dry prior to marking.Covered mark with thin film transparent dressing to preserve mark until date of surgery.   [facility] Nurse team will follow up with patient after surgery for continue ostomy care and teaching.   Domenic Moras RN MSN [facility] Pager [phone]

## 2018-06-10 NOTE — H&P (Signed)
[name] is an 77 y.o. male.    Chief Complaint: Pre-OP Cystoprostatectomy  HPI:   1 - Muscle Invasive Bladder Cancer - T2G3 3cm anterior bladder cancer by TURBT 12/2017. CT clinically localized. Underwent neoadjuvant chemo with gem-cis by [facility]. Restagign imagign 04/2018 w/o progression or overt extravesial disease.   2 - Enlarged Prostate with Urinary Urgency - on tamsulosin at baseline with good control of obstructive LUTS. Prostate vol 118mL with median lobe by CT 11/2017.   3 - Stage 3 Renal Insufficiency - Cr 1.4's by PCP labs. CT 2019 w/o hydro. Stable even on chemo at 1.2-1.4 tange.   PMH sig for HLD, Umbilical hernia repair with mesh. NO ischemic CV disease / blood thinners. His PCP is [name] in [place].   Today " [name]" is seen as pre-op admission for labs, bowel prep, stomal marking prior to curative intent cystoprostatectomy tomorrow.      Past Medical History:  Diagnosis Date  . Bladder cancer [facility]) dx'd 12/2017   urologist-  dr Tresa Moore  . BPH (benign prostatic hyperplasia)   . Full dentures   . GERD (gastroesophageal reflux disease)   . Hiatal hernia   . History of bladder stone   . History of kidney stones    per pt hx passed stones spontenaously  . Hypercholesterolemia   . Wears glasses   . Wears hearing aid in both ears     Past Surgical History:  Procedure Laterality Date  . COLONOSCOPY WITH ESOPHAGOGASTRODUODENOSCOPY (EGD)  04-08-2012  dr [name]  . CYSTO/  BLADDER STONE EXTRACTIONS  1998 approx.  . IR FLUORO GUIDE PORT INSERTION RIGHT  01/07/2018  . IR US GUIDE VASC ACCESS RIGHT  01/07/2018  . TRANSURETHRAL RESECTION OF BLADDER TUMOR N/A 12/11/2017   Procedure: CYSTOSCOPY WITH BILATERAL RETROGRADE PYELOGRAM, TRANSURETHRAL RESECTION OF BLADDER TUMOR (TURBT);  Surgeon: Damon Frock, MD;  Location: [facility];  Service: Urology;  Laterality: N/A;  . UMBILICAL HERNIA REPAIR  2008  . VASECTOMY  1980s    Family History  Problem Relation  Age of Onset  . Colon cancer Paternal Grandfather    Social History:  reports that he quit smoking about 20 years ago. His smoking use included cigarettes. He has a 10.00 pack-year smoking history. He has never used smokeless tobacco. He reports that he drinks alcohol. He reports that he does not use drugs.  Allergies:  Allergies  Allergen Reactions  . Emend [Fosaprepitant Dimeglumine] Cough    Reaction during infusion with cough, erythema of face and neck.      Medications Prior to Admission  Medication Sig Dispense Refill  . dexamethasone (DECADRON) 4 MG tablet Take 1 tablet (4 mg total) by mouth 2 (two) times daily. 4 mg twice daily on day before, day of and day after chemotherapy (Patient not taking: Reported on 06/04/2018) 30 tablet 0  . fenofibrate 160 MG tablet Take 160 mg by mouth at bedtime.    . lidocaine-prilocaine (EMLA) cream Apply 1 application topically as needed. (Patient not taking: Reported on 06/04/2018) 30 g 0  . omeprazole (PRILOSEC) 20 MG capsule Take 20 mg by mouth every morning.     . prochlorperazine (COMPAZINE) 10 MG tablet Take 1 tablet (10 mg total) by mouth every 6 (six) hours as needed for nausea or vomiting. (Patient not taking: Reported on 04/21/2018) 30 tablet 0  . simvastatin (ZOCOR) 40 MG tablet Take 40 mg by mouth at bedtime.    . tamsulosin (FLOMAX) 0.4 MG CAPS  capsule Take 0.4 mg by mouth daily after breakfast.      No results found for this or any previous visit (from the past 48 hour(s)). No results found.  Review of Systems  Constitutional: Negative.   HENT: Positive for hearing loss.   Eyes: Negative.   Respiratory: Negative.   Cardiovascular: Negative.   Gastrointestinal: Negative.   Genitourinary: Negative.   Musculoskeletal: Negative.   Skin: Negative.   Neurological: Negative.   Endo/Heme/Allergies: Negative.   Psychiatric/Behavioral: Negative.     There were no vitals taken for this visit. Physical Exam  Constitutional: He appears  well-developed.  Wife at bedside.  HENT:  Head: Normocephalic.  Eyes: Pupils are equal, round, and reactive to light.  Neck: Normal range of motion.  Cardiovascular: Normal rate.  Respiratory: Effort normal.  GI: Soft.  transverese infraumbilial scar c/w prior hernia repair.   Genitourinary:  Genitourinary Comments: No CVAT  Neurological: He is alert.  Skin: Skin is warm.  Psychiatric: He has a normal mood and affect.     Assessment/Plan  Proceed as planned with cystoprostatecotmy tomorrow. Risks, benefits, alternatives, exepcted peri-op course discussed extensively previously and reiterated today.   Clears, consent, bowel prep, CMP, CBC NPO p MN.   Damon Frock, MD 06/10/2018, 1:18 PM

## 2018-06-11 ENCOUNTER — Encounter (HOSPITAL_COMMUNITY)
Admission: RE | Disposition: A | Discharge status: Home Health | Payer: Self-pay | Source: Ambulatory Visit | Attending: Urology

## 2018-06-11 ENCOUNTER — Inpatient Hospital Stay (HOSPITAL_COMMUNITY): Payer: Medicare Other | Admitting: Anesthesiology

## 2018-06-11 ENCOUNTER — Encounter (HOSPITAL_COMMUNITY): Payer: Self-pay | Admitting: Certified Registered Nurse Anesthetist

## 2018-06-11 HISTORY — PX: ROBOT ASSISTED LAPAROSCOPIC RADICAL PROSTATECTOMY: SHX5141

## 2018-06-11 HISTORY — PX: CYSTOSCOPY WITH INJECTION: SHX1424

## 2018-06-11 LAB — HEMOGLOBIN AND HEMATOCRIT, BLOOD
HCT: 37.3 % — ABNORMAL LOW (ref 39.0–52.0)
Hemoglobin: 12.5 g/dL — ABNORMAL LOW (ref 13.0–17.0)

## 2018-06-11 LAB — BASIC METABOLIC PANEL
Anion gap: 6 (ref 5–15)
BUN: 19 mg/dL (ref 8–23)
CHLORIDE: 109 mmol/L (ref 98–111)
CO2: 24 mmol/L (ref 22–32)
CREATININE: 1.32 mg/dL — AB (ref 0.61–1.24)
Calcium: 8.7 mg/dL — ABNORMAL LOW (ref 8.9–10.3)
GFR calc Af Amer: 59 mL/min — ABNORMAL LOW (ref >60)
GFR calc non Af Amer: 51 mL/min — ABNORMAL LOW (ref >60)
GLUCOSE: 168 mg/dL — AB (ref 70–99)
POTASSIUM: 4.4 mmol/L (ref 3.5–5.1)
Sodium: 139 mmol/L (ref 135–145)

## 2018-06-11 SURGERY — PROSTATECTOMY, RADICAL, ROBOT-ASSISTED, LAPAROSCOPIC
Anesthesia: General

## 2018-06-11 MED ORDER — DEXAMETHASONE SODIUM PHOSPHATE 10 MG/ML IJ SOLN
INTRAMUSCULAR | Status: DC | PRN
  Administered 2018-06-11: 5 mg via INTRAVENOUS

## 2018-06-11 MED ORDER — ONDANSETRON HCL 4 MG/2ML IJ SOLN
4.0000 mg | INTRAMUSCULAR | Status: DC | PRN
  Administered 2018-06-14: 4 mg via INTRAVENOUS
  Filled 2018-06-11: qty 2

## 2018-06-11 MED ORDER — ROCURONIUM BROMIDE 10 MG/ML (PF) SYRINGE
PREFILLED_SYRINGE | INTRAVENOUS | Status: DC | PRN
  Administered 2018-06-11: 50 mg via INTRAVENOUS
  Administered 2018-06-11: 10 mg via INTRAVENOUS
  Administered 2018-06-11 (×2): 20 mg via INTRAVENOUS
  Administered 2018-06-11: 10 mg via INTRAVENOUS

## 2018-06-11 MED ORDER — ALVIMOPAN 12 MG PO CAPS
ORAL_CAPSULE | ORAL | Status: AC
  Filled 2018-06-11: qty 1

## 2018-06-11 MED ORDER — LACTATED RINGERS IV SOLN
INTRAVENOUS | Status: DC
  Administered 2018-06-11 – 2018-06-15 (×9): via INTRAVENOUS

## 2018-06-11 MED ORDER — ACETAMINOPHEN 10 MG/ML IV SOLN
1000.0000 mg | Freq: Four times a day (QID) | INTRAVENOUS | Status: AC
  Administered 2018-06-11 – 2018-06-12 (×4): 1000 mg via INTRAVENOUS
  Filled 2018-06-11 (×4): qty 100

## 2018-06-11 MED ORDER — PROMETHAZINE HCL 25 MG/ML IJ SOLN: 6.2500 mg | INTRAMUSCULAR | Status: DC | PRN

## 2018-06-11 MED ORDER — LIDOCAINE 2% (20 MG/ML) 5 ML SYRINGE
INTRAMUSCULAR | Status: DC | PRN
  Administered 2018-06-11: 60 mg via INTRAVENOUS

## 2018-06-11 MED ORDER — HYDROMORPHONE HCL 1 MG/ML IJ SOLN
0.2500 mg | INTRAMUSCULAR | Status: AC | PRN
  Administered 2018-06-11 (×8): 0.5 mg via INTRAVENOUS

## 2018-06-11 MED ORDER — MORPHINE SULFATE (PF) 2 MG/ML IV SOLN
2.0000 mg | INTRAVENOUS | Status: DC | PRN
  Administered 2018-06-12: 2 mg via INTRAVENOUS
  Filled 2018-06-11: qty 1

## 2018-06-11 MED ORDER — LACTATED RINGERS IR SOLN
Status: DC | PRN
  Administered 2018-06-11: 1000 mL

## 2018-06-11 MED ORDER — ONDANSETRON HCL 4 MG/2ML IJ SOLN
INTRAMUSCULAR | Status: DC | PRN
  Administered 2018-06-11: 4 mg via INTRAVENOUS

## 2018-06-11 MED ORDER — SODIUM CHLORIDE 0.9 % IJ SOLN
INTRAMUSCULAR | Status: DC | PRN
  Administered 2018-06-11: 20 mL

## 2018-06-11 MED ORDER — SUGAMMADEX SODIUM 200 MG/2ML IV SOLN
INTRAVENOUS | Status: DC | PRN
  Administered 2018-06-11: 200 mg via INTRAVENOUS

## 2018-06-11 MED ORDER — PROPOFOL 10 MG/ML IV BOLUS
INTRAVENOUS | Status: DC | PRN
  Administered 2018-06-11: 140 mg via INTRAVENOUS

## 2018-06-11 MED ORDER — LACTATED RINGERS IV SOLN
INTRAVENOUS | Status: DC
  Administered 2018-06-11 (×3): via INTRAVENOUS

## 2018-06-11 MED ORDER — FENTANYL CITRATE (PF) 100 MCG/2ML IJ SOLN
INTRAMUSCULAR | Status: DC | PRN
  Administered 2018-06-11: 50 ug via INTRAVENOUS
  Administered 2018-06-11 (×4): 100 ug via INTRAVENOUS

## 2018-06-11 MED ORDER — LACTATED RINGERS IV SOLN
INTRAVENOUS | Status: DC | PRN
  Administered 2018-06-11: 10:00:00 via INTRAVENOUS

## 2018-06-11 MED ORDER — BUPIVACAINE LIPOSOME 1.3 % IJ SUSP
20.0000 mL | Freq: Once | INTRAMUSCULAR | Status: AC
  Administered 2018-06-11: 20 mL
  Filled 2018-06-11 (×2): qty 20

## 2018-06-11 MED ORDER — MEPERIDINE HCL 50 MG/ML IJ SOLN: 6.2500 mg | INTRAMUSCULAR | Status: DC | PRN

## 2018-06-11 MED ORDER — ALVIMOPAN 12 MG PO CAPS
12.0000 mg | ORAL_CAPSULE | Freq: Two times a day (BID) | ORAL | Status: DC
  Administered 2018-06-12 – 2018-06-14 (×5): 12 mg via ORAL
  Filled 2018-06-11 (×6): qty 1

## 2018-06-11 MED ORDER — STERILE WATER FOR IRRIGATION IR SOLN
Status: DC | PRN
  Administered 2018-06-11: 1000 mL

## 2018-06-11 SURGICAL SUPPLY — 116 items
ADH SKN CLS APL DERMABOND .7 (GAUZE/BANDAGES/DRESSINGS) ×1
AGENT HMST KT MTR STRL THRMB (HEMOSTASIS)
APL ESCP 34 STRL LF DISP (HEMOSTASIS)
APL SWBSTK 6 STRL LF DISP (MISCELLANEOUS) ×1
APPLICATOR COTTON TIP 6 STRL (MISCELLANEOUS) ×2 IMPLANT
APPLICATOR COTTON TIP 6IN STRL (MISCELLANEOUS) ×3
APPLICATOR SURGIFLO ENDO (HEMOSTASIS) IMPLANT
BAG LAPAROSCOPIC 12 15 PORT 16 (BASKET) ×1 IMPLANT
BAG RETRIEVAL 12/15 (BASKET) ×2
BAG RETRIEVAL 12/15MM (BASKET) ×1
BAG URO CATCHER STRL LF (MISCELLANEOUS) ×1 IMPLANT
BLADE SURG SZ10 CARB STEEL (BLADE) IMPLANT
CATH FOLEY 2WAY SLVR 18FR 30CC (CATHETERS) ×4 IMPLANT
CATH TIEMANN FOLEY 18FR 5CC (CATHETERS) ×1 IMPLANT
CELLS DAT CNTRL 66122 CELL SVR (MISCELLANEOUS) ×1 IMPLANT
CHLORAPREP W/TINT 26ML (MISCELLANEOUS) ×4 IMPLANT
CLIP VESOLOCK LG 6/CT PURPLE (CLIP) ×8 IMPLANT
CLIP VESOLOCK MED LG 6/CT (CLIP) ×3 IMPLANT
CLIP VESOLOCK XL 6/CT (CLIP) ×5 IMPLANT
CLOTH BEACON ORANGE TIMEOUT ST (SAFETY) ×3 IMPLANT
CONT SPEC 4OZ CLIKSEAL STRL BL (MISCELLANEOUS) ×3 IMPLANT
COVER SURGICAL LIGHT HANDLE (MISCELLANEOUS) ×3 IMPLANT
COVER TIP SHEARS 8 DVNC (MISCELLANEOUS) ×2 IMPLANT
COVER TIP SHEARS 8MM DA VINCI (MISCELLANEOUS) ×2
CUTTER ECHEON FLEX ENDO 45 340 (ENDOMECHANICALS) ×1 IMPLANT
DECANTER SPIKE VIAL GLASS SM (MISCELLANEOUS) ×4 IMPLANT
DERMABOND ADVANCED (GAUZE/BANDAGES/DRESSINGS) ×2
DERMABOND ADVANCED .7 DNX12 (GAUZE/BANDAGES/DRESSINGS) ×3 IMPLANT
DRAIN PENROSE 18X1/2 LTX STRL (DRAIN) IMPLANT
DRAPE ARM DVNC X/XI (DISPOSABLE) ×8 IMPLANT
DRAPE COLUMN DVNC XI (DISPOSABLE) ×2 IMPLANT
DRAPE DA VINCI XI ARM (DISPOSABLE) ×8
DRAPE DA VINCI XI COLUMN (DISPOSABLE) ×2
DRAPE LG THREE QUARTER DISP (DRAPES) ×2 IMPLANT
DRAPE SURG IRRIG POUCH 19X23 (DRAPES) ×3 IMPLANT
DRSG TEGADERM 4X4.75 (GAUZE/BANDAGES/DRESSINGS) ×3 IMPLANT
ELECT CAUTERY BLADE 6.4 (BLADE) ×1 IMPLANT
ELECT PENCIL ROCKER SW 15FT (MISCELLANEOUS) ×3 IMPLANT
ELECT REM PT RETURN 15FT ADLT (MISCELLANEOUS) ×4 IMPLANT
GAUZE SPONGE 2X2 8PLY STRL LF (GAUZE/BANDAGES/DRESSINGS) IMPLANT
GLOVE BIO SURGEON STRL SZ 6.5 (GLOVE) ×5 IMPLANT
GLOVE BIO SURGEONS STRL SZ 6.5 (GLOVE) ×2
GLOVE BIOGEL M STRL SZ7.5 (GLOVE) ×11 IMPLANT
GLOVE BIOGEL PI IND STRL 7.5 (GLOVE) ×1 IMPLANT
GLOVE BIOGEL PI INDICATOR 7.5 (GLOVE) ×2
GOWN STRL REUS W/TWL LRG LVL3 (GOWN DISPOSABLE) ×20 IMPLANT
GOWN STRL REUS W/TWL XL LVL3 (GOWN DISPOSABLE) ×5 IMPLANT
HOLDER FOLEY CATH W/STRAP (MISCELLANEOUS) ×3 IMPLANT
IRRIG SUCT STRYKERFLOW 2 WTIP (MISCELLANEOUS) ×3
IRRIGATION SUCT STRKRFLW 2 WTP (MISCELLANEOUS) ×2 IMPLANT
IV LACTATED RINGERS 1000ML (IV SOLUTION) ×1 IMPLANT
KIT PROCEDURE DA VINCI SI (MISCELLANEOUS) ×2
KIT PROCEDURE DVNC SI (MISCELLANEOUS) ×1 IMPLANT
LOOP VESSEL MAXI BLUE (MISCELLANEOUS) ×3 IMPLANT
MANIFOLD NEPTUNE II (INSTRUMENTS) ×3 IMPLANT
NDL INSUFFLATION 14GA 120MM (NEEDLE) ×2 IMPLANT
NDL SPNL 22GX7 QUINCKE BK (NEEDLE) ×1 IMPLANT
NEEDLE INSUFFLATION 14GA 120MM (NEEDLE) ×6 IMPLANT
NEEDLE SPNL 22GX7 QUINCKE BK (NEEDLE) IMPLANT
PACK CYSTO (CUSTOM PROCEDURE TRAY) ×1 IMPLANT
PACK ROBOT UROLOGY CUSTOM (CUSTOM PROCEDURE TRAY) ×4 IMPLANT
PAD POSITIONING PINK XL (MISCELLANEOUS) ×4 IMPLANT
PORT ACCESS TROCAR AIRSEAL 12 (TROCAR) ×2 IMPLANT
PORT ACCESS TROCAR AIRSEAL 5M (TROCAR) ×2
RELOAD STAPLE 60 2.6 WHT THN (STAPLE) ×3 IMPLANT
RELOAD STAPLE 60 4.1 GRN THCK (STAPLE) ×3 IMPLANT
RELOAD STAPLER GREEN 60MM (STAPLE) ×6 IMPLANT
RELOAD STAPLER WHITE 60MM (STAPLE) ×8 IMPLANT
RETRACTOR LONRSTAR 16.6X16.6CM (MISCELLANEOUS) IMPLANT
RETRACTOR STAY HOOK 5MM (MISCELLANEOUS) IMPLANT
RETRACTOR STER APS 16.6X16.6CM (MISCELLANEOUS)
RETRACTOR WND ALEXIS 18 MED (MISCELLANEOUS) ×1 IMPLANT
RTRCTR WOUND ALEXIS 18CM MED (MISCELLANEOUS) ×3
SEAL CANN UNIV 5-8 DVNC XI (MISCELLANEOUS) ×8 IMPLANT
SEAL XI 5MM-8MM UNIVERSAL (MISCELLANEOUS) ×8
SET TRI-LUMEN FLTR TB AIRSEAL (TUBING) ×4 IMPLANT
SOLUTION ELECTROLUBE (MISCELLANEOUS) ×4 IMPLANT
SPONGE GAUZE 2X2 STER 10/PKG (GAUZE/BANDAGES/DRESSINGS)
SPONGE LAP 18X18 RF (DISPOSABLE) ×2 IMPLANT
SPONGE LAP 4X18 RFD (DISPOSABLE) ×4 IMPLANT
STAPLER ECHELON LONG 60 440 (INSTRUMENTS) ×3 IMPLANT
STAPLER RELOAD GREEN 60MM (STAPLE) ×18
STAPLER RELOAD WHITE 60MM (STAPLE) ×24
STENT SET URETHERAL LEFT 7FR (STENTS) ×3 IMPLANT
STENT SET URETHERAL RIGHT 7FR (STENTS) ×3 IMPLANT
SURGIFLO W/THROMBIN 8M KIT (HEMOSTASIS) IMPLANT
SUT CHROMIC 4 0 RB 1X27 (SUTURE) ×3 IMPLANT
SUT ETHILON 3 0 PS 1 (SUTURE) ×4 IMPLANT
SUT MNCRL AB 4-0 PS2 18 (SUTURE) ×8 IMPLANT
SUT NOVA NAB GS-21 0 18 T12 DT (SUTURE) ×4 IMPLANT
SUT PDS AB 0 CTX 36 PDP370T (SUTURE) ×9 IMPLANT
SUT PDS AB 1 CT1 27 (SUTURE) ×2 IMPLANT
SUT SILK 3 0 SH 30 (SUTURE) IMPLANT
SUT SILK 3 0 SH CR/8 (SUTURE) ×3 IMPLANT
SUT VIC AB 2-0 SH 18 (SUTURE) IMPLANT
SUT VIC AB 2-0 SH 27 (SUTURE) ×3
SUT VIC AB 2-0 SH 27X BRD (SUTURE) ×1 IMPLANT
SUT VIC AB 2-0 UR5 27 (SUTURE) ×12 IMPLANT
SUT VIC AB 3-0 SH 27 (SUTURE) ×18
SUT VIC AB 3-0 SH 27X BRD (SUTURE) ×1 IMPLANT
SUT VIC AB 3-0 SH 27XBRD (SUTURE) ×1 IMPLANT
SUT VIC AB 4-0 RB1 27 (SUTURE) ×15
SUT VIC AB 4-0 RB1 27XBRD (SUTURE) ×4 IMPLANT
SUT VICRYL 0 UR6 27IN ABS (SUTURE) ×3 IMPLANT
SUT VLOC BARB 180 ABS3/0GR12 (SUTURE) ×3
SUTURE VLOC BRB 180 ABS3/0GR12 (SUTURE) ×4 IMPLANT
SYR 27GX1/2 1ML LL SAFETY (SYRINGE) ×1 IMPLANT
SYR CONTROL 10ML LL (SYRINGE) IMPLANT
SYSTEM UROSTOMY GENTLE TOUCH (WOUND CARE) ×3 IMPLANT
TOWEL OR NON WOVEN STRL DISP B (DISPOSABLE) ×2 IMPLANT
TROCAR BLADELESS 15MM (ENDOMECHANICALS) ×3 IMPLANT
TUBING CONNECTING 10 (TUBING) IMPLANT
TUBING CONNECTING 10' (TUBING)
WATER STERILE IRR 1000ML POUR (IV SOLUTION) ×3 IMPLANT
WATER STERILE IRR 3000ML UROMA (IV SOLUTION) ×1 IMPLANT
YANKAUER SUCT BULB TIP 10FT TU (MISCELLANEOUS) ×2 IMPLANT

## 2018-06-11 NOTE — Transfer of Care (Signed)
Immediate Anesthesia Transfer of Care Note  Patient: [name]  Procedure(s) Performed: XI ROBOT ASSISTED LAPAROSCOPIC RADICAL CYSTOPROSTATECTOMY BILATERAL PELVIC LYMPHADENECTOMY,NO ORTHOTOPIC NEOBLADDER - ILEAL (N/A ) CYSTOSCOPY WITH INJECTION (N/A )  Patient Location: PACU  Anesthesia Type:General  Level of Consciousness: awake, alert  and oriented  Airway & Oxygen Therapy: Patient Spontanous Breathing and Patient connected to face mask oxygen  Post-op Assessment: Report given to RN and Post -op Vital signs reviewed and stable  Post vital signs: Reviewed and stable  Last Vitals:  Vitals Value Taken Time  BP 156/97 06/11/2018 12:55 PM  Temp    Pulse 98 06/11/2018 12:56 PM  Resp 13 06/11/2018 12:56 PM  SpO2 100 % 06/11/2018 12:56 PM  Vitals shown include unvalidated device data.  Last Pain:  Vitals:   06/11/18 0644  TempSrc: Oral  PainSc: 0-No pain      Patients Stated Pain Goal: 3 (78/58/85 0277)  Complications: No apparent anesthesia complications

## 2018-06-11 NOTE — Progress Notes (Signed)
Day of Surgery   Subjective/Chief Complaint:   1 - Muscle Invasive Bladder Cancer - T2G3 3cm anterior bladder cancer by TURBT 12/2017. CT clinically localized. Underwent neoadjuvant chemo with gem-cis by [facility]. Restagign imagign 04/2018 w/o progression or overt extravesial disease.   2 - Enlarged Prostate with Urinary Urgency - on tamsulosin at baseline with good control of obstructive LUTS. Prostate vol 175mL with median lobe by CT 11/2017.   3 - Stage 3 Renal Insufficiency - Cr 1.4's by PCP labs. CT 2019 w/o hydro. Stable even on chemo at 1.2-1.4 tange.    Today " [name]" is seen to proceed with cystoprostatectomy. He completed bowel prep to clear and had stomal marking.        Objective: Vital signs in last 24 hours: Temp:  [97.8 F (36.6 C)-98.8 F (37.1 C)] 98.8 F (37.1 C) (08/30 0644) Pulse Rate:  [66-77] 72 (08/30 0644) Resp:  [16-18] 16 (08/30 0644) BP: (119-153)/(84-99) 129/90 (08/30 0644) SpO2:  [97 %-99 %] 99 % (08/30 0644) Weight:  [73.9 kg-75.2 kg] 73.9 kg (08/30 0644) Last BM Date: 06/10/18  Intake/Output from previous day: 08/29 0701 - 08/30 0700 In: 54.3 [I.V.:54.3] Out: -  Intake/Output this shift: No intake/output data recorded.     Physical Exam  Constitutional: He appears well-developed.  HENT:  Head: Normocephalic.  Eyes: Pupils are equal, round, and reactive to light.  Neck: Normal range of motion.  Cardiovascular: Normal rate.  Respiratory: Effort normal.  GI: Soft.  transverese infraumbilial scar c/w prior hernia repair.  RLQ Urostomy marking noted.  Genitourinary:  Genitourinary Comments: No CVAT  Neurological: He is alert.  Skin: Skin is warm.  Psychiatric: He has a normal mood and affect.   Lab Results:  Recent Labs    06/10/18 1330  WBC 5.1  HGB 13.5  HCT 40.9  PLT 180   BMET Recent Labs    06/10/18 1330  NA 140  K 4.4  CL 104  CO2 27  GLUCOSE 98  BUN 27*  CREATININE 1.36*  CALCIUM 9.5   PT/INR No results for  input(s): LABPROT, INR in the last 72 hours. ABG No results for input(s): PHART, HCO3 in the last 72 hours.  Invalid input(s): PCO2, PO2  Studies/Results: No results found.  Anti-infectives: Anti-infectives (From admission, onward)   Start     Dose/Rate Route Frequency Ordered Stop   06/11/18 0600  piperacillin-tazobactam (ZOSYN) IVPB 3.375 g     3.375 g 100 mL/hr over 30 Minutes Intravenous 30 min pre-op 06/10/18 1313     06/10/18 1330  neomycin (MYCIFRADIN) tablet 500 mg     500 mg Oral Every 6 hours 06/10/18 1317 06/10/18 2314   06/10/18 1330  metroNIDAZOLE (FLAGYL) tablet 500 mg     500 mg Oral Every 6 hours 06/10/18 1317 06/10/18 2314      Assessment/Plan:  Proceed as planned with cystoprostatecotmy. Peri-op course again discussed and questions answered.   [facility], Aily Tzeng 06/11/2018

## 2018-06-11 NOTE — Anesthesia Procedure Notes (Signed)
Procedure Name: Intubation Performed by: Gean Maidens, CRNA Pre-anesthesia Checklist: Patient identified, Emergency Drugs available, Suction available, Patient being monitored and Timeout performed Patient Re-evaluated:Patient Re-evaluated prior to induction Oxygen Delivery Method: Circle system utilized Preoxygenation: Pre-oxygenation with 100% oxygen Induction Type: IV induction Ventilation: Mask ventilation without difficulty and Oral airway inserted - appropriate to patient size Laryngoscope Size: Mac and 4 Grade View: Grade I Tube type: Oral Tube size: 7.5 mm Number of attempts: 1 Airway Equipment and Method: Stylet Placement Confirmation: ETT inserted through vocal cords under direct vision and positive ETCO2 Secured at: 23 cm Tube secured with: Tape Dental Injury: Teeth and Oropharynx as per pre-operative assessment

## 2018-06-11 NOTE — Op Note (Signed)
[name], [name] MEDICAL RECORD [id number] ACCOUNT [id number] DATE OF BIRTH:Feb 24, 1941 FACILITY: [facility] LOCATION: [facility] PHYSICIAN:Semaje Kinker, MD  OPERATIVE REPORT  DATE OF PROCEDURE:  06/11/2018  PREOPERATIVE DIAGNOSIS:  Muscle invasive bladder cancer.  PROCEDURE: 1.  Cystoscopy with injection of an indocyanine green dye. 2.  Robotic-assisted laparoscopic radical cystoprostatectomy with bilateral pelvic lymphadenopathy and needle count, urinary diversion.  ESTIMATED BLOOD LOSS:  30 mL.  COMPLICATIONS:  None.  SPECIMENS: 1.  Cystoprostatectomy. 2.  Right distal ureteral margin. 3.  Right distal final ureteral margin. 4.  Left distal ureteral margin frozen. 5.  Left final distal ureteral margin. 6.  Right external iliac lymph nodes. 7.  Right obturator lymph nodes. 8.  Right common iliac lymph nodes. 9.  Right internal iliac lymph nodes. 10.  Aortic bifurcation lymph nodes. 11.  Left common iliac lymph nodes. 12.  Left internal iliac lymph nodes. 13.  Left external iliac lymph nodes. 14.  Left obturator lymph nodes for permanent pathology.   Sentinel lymph nodes noted as such and pathology requisition.  FINDINGS: 1.  Evidence of prior likely non-mesh based umbilical hernia repair.  No significant intraabdominal adhesions. 2.  Very large prostate as anticipated.    DRAINS:   1.  Jackson-Pratt drain to bulb suction. 2.  Right lower quadrant urostomy to gravity drainage with right (red) and left (blue) bander stents.  INDICATIONS:  The patient is a very pleasant and quite vigorous 77 year old man who has a recent history of muscle invasive bladder cancer.  He has been in the pathway of curative intent with neoadjuvant chemotherapy followed by cystectomy.  He underwent  his neoadjuvant chemotherapy, which he tolerated quite well.  CT imaging revealed no locally advanced or distant disease.  Options were discussed including continued critical pathway  cystoprostatectomy and he wished to proceed with surgery.  He presents  for this today.  He was admitted yesterday for bowel prep and labs which he tolerated the procedure well.  Informed consent was obtained and placed in medical record.  DESCRIPTION OF PROCEDURE:  The patient being [name], procedure being robotic cystoprostatectomy with a cystoscopy and indocyanine green dye injection was confirmed.  Procedure timeout was performed administered.  General endotracheal anesthesia  induced.  The patient was placed into a low lithotomy position and sterile field was created by prepping the patient's penis, perineum and proximal thighs using iodine in his infra-xiphoid and abdomen using chlorhexidine gluconate.  His arms were tucked  at his side using gel rolls.  He was further fastened to the OR table using 3-inch tape over foam padding across his supraxiphoid chest.  A test of steep Trendelenburg was performed.  He was found to be suitably positioned.  Next, cystourethroscopy was  performed using a 24-French rigid injection cystoscope set.  Inspection of the anterior and posterior urethra revealed only very large bilobar prostatic hypertrophy.  Inspection of the urinary bladder revealed essentially no residual intraluminal bladder  cancer changes.  Given the patient's prior anterior tumor approximately 5 mL of indocyanine green dye was injected in 0.5 mL aliquots in a submucosal bleb type fashion for sentinel node lymphangiography and a Foley catheter was then placed to straight  drain.  The patient was then repositioned into a steep Trendelenburg positioning employing 23 degrees of table tilt and a high-flow, blood pressure pneumoperitoneum was obtained using Veress technique in the left hemiabdomen away from his prior umbilical  hernia repair site.  An 8 mm robotic camera port was then placed in  the same location.  Laparoscopic examination of the peritoneal cavity revealed no significant adhesions, no  visceral injury.  Distal ports were placed as follows:  Supraumbilical  midline 8 mm robotic port, right paramedian 8 mm robotic port, right paramedian 15 mm assistant port at the site of the previously marked stoma right far lateral 12 mm AirSeal assist port, left paramedian 8 mm robotic port.  Robot was docked and passed  electronic checks.  Attention was directed to identification of the left ureter.  An incision was made lateral to the descending colon from the area of the iliac vessels superiorly for a distance of approximately 10 cm above this and the posterior  peritoneal flap was carefully swept medially.  The left medial umbilical ligament was used as a bucket handle.  An incision in the peritoneum was carried lateral to this towards the area of the vas deferens was ligated and placed on gentle medial  traction.  This exposed the area of the left pelvic lymph node fields in the left ureter.  The left ureter was encountered as it coursed over the iliac vessels.  It was marked with a vessel loop, dissected distally to the area of the ureterovesical  junction, which was doubly clipped and ligated, taking exquisite care to avoid devascularization.  Frozen section was sent and negative for carcinoma.  This was dissected proximally for a distance of approximately 6 cm above the area of the iliac  crossing and then put in position outside the deep pelvis.  Attention was directed to the left sided lymphadenectomy.  Fibrofatty tissue with the confines of the left common iliac vein and artery were dissected free.  Hemostasis was achieved with clips,  set aside, labeled left common lymph nodes.  Next, fatty tissue overlying the boundaries of the left external iliac artery, vein, pelvic sidewall, and iliac bifurcation was dissected free, set aside labeled as left external iliac lymph nodes.  Next fibrofatty tissue within the boundaries of the  left external iliac vein, pelvic sidewall and obturator nerve were  dissected free and labeled as left obturator lymph nodes.  The obturator nerve was inspected following these maneuvers and found to be uninjured and finally fibrotic tissue overlying the  more superior aspect of the left internal iliac artery superior to the superior vesicle artery was dissected free.  This was labeled as left internal iliac lymph nodes.  Some were sentinel as per ICG and labeled as such.  Attention was directed at the  right side.  As per the left posterior peritoneal incision was made lateral to the ascending colon, inferiorly coursing over the iliac vessels and then lateral to the right medial umbilical ligament.  The right ureter was encountered as it coursed over  the iliac vessels and was dissected distally to the right ureterovesical junction, doubly clipped and ligated, the proximal have a tagged white suture.  Notably, the prior left one had a tagged dyed suture.  Frozen section was negative for carcinoma.  It  was dissected proximally above the iliac vessels.  It was approximately 2-3 cm and placed out of the deep pelvis.  Lymphadenectomy was performed on the right side as per the left in the same areas of lymph nodes.  Lymphostasis was achieved with cold  clips.  The right obturator nerve was inspected following laminectomy and found to be uninjured.  The posterior peritoneal flap was carried superiorly to the area of the aortic bifurcation.  Additional fibrofatty tissue overlying the aortic bifurcation  was  set aside and labeled as such.  Attention was directed at the retroperitonealization and passage of the left ureter.  The posterior peritoneal window just above the aortic bifurcation was developed from the right side.  The left ureter was passed  retroperitoneally to the right side of the abdomen via this window and appeared to be a sufficient length of vascularity.  The ileocecal junction area was then identified and traced proximally for a distance of approximately 12 cm and  a loop of distal  ileum that appeared to be acceptable for conduit diversion was marked using silk type suture at the area of the epiploic fat.  Attention was then directed at a posterior peritoneal flap.  The previous posterior peritoneal incisions was carried across the  midline posterior to the prostate and the posterior peritoneal flap was developed and dissection proceeded within this plane towards the area of the prostatic apex.  This exposed the vascular pedicles on each side.  These were controlled using the white  load vascular stapler x2 each side, taking exquisite care to avoid any injury to the colon, which did not occur.  Attention was directed to the anterior dissection.  The space of Retzius was developed by incising between the medial umbilical ligaments  anteriorly and dropped in the bladder away from the anterior abdominal wall.  This exposed the area of the endopelvic fascia which was carefully swept away from the base to the apex orientation.  This exposed the dorsal venous complex which was  controlled using stapler, taking exquisite care to avoid membranous urethral injury which did not occur.  Finally, apical dissection was performed in the anterior plane.  The membranous urethra coldly transected.  Both ends of the Foley catheter were  removed and accounted for.  This completely freed up the cystoprostatectomy specimen which was placed in an extra-large EndoCatch bag for later retrieval.  Digital rectal exam was then performed under laparoscopic vision with indicator glove.  No  evidence of rectal violation was noted.  The membranous urethral stump was oversewn using 3-0 V-Loc to prevent prolonged penile drainage.  At this point, all sponge, needle counts were correct.  Hemostasis appeared excellent.  We achieved the goals of  the extirpative portion of the procedure today.  A closed suction drain was brought out the previous left lateral most robotic port site near the peritoneal  cavity.  The tag of the specimen retrieval bag was brought to the left paramedian robotic port  site and the tags of the bilateral ureters and distal ileum were grasped with a self-locking laparoscopic grasper, via the 15 mm assist port site.  Robot was then undocked. Specimen  was retrieved by extending the camera port site inferiorly deairing on  the left side of the abdomen for a distance of approximately 6 cm, removing the cystoprostatectomy specimen, setting aside from pathology.  Notably, there was no evidence of mesh at the fascia of the midline in the area of the umbilicus only some remote  permanent suture.  A wound protector was then applied.  The bilateral ureters and tag of the distal ileum were brought through the wound protector into the area of the open surgical portion.  Attention was directed at conduit harvest.  A segment of  distal ileum 15 cm in length was taken into continuity using ureteroscope bowel stapler proximally and distally as per prior marking.  The mesentery was further developed x2 fires of the white load stapler distally, 1 load proximally, taking exquisite  care  to avoid devascularization of the conduit or anastomotic segments which did not occur.  The conduit segment was then lined to proper proximal distal orientation in a retroperitoneal fashion.  Attention was directed to the bowel anastomosis.  A  side-to-side anastomosis was performed on the antimesenteric aspect of the ileum with 2 loads renal stapler.  The acute angle was bolstered with interrupted silk.  The free end was oversewn with interrupted silk and a second layer of imbricating  interrupted silk.  The mesenteric defect was reapproximated with interrupted silk to avoid internal herniation.  The bowel-to-bowel anastomosis was visibly viable, palpably patent and was redelivered into the abdominal cavity.  Attention was directed at  the ureteroileal anastomosis.  First, the proximal staple line was excluded  using running Vicryl to prevent proximal stone formation.  The staple line was removed.  Attention was initially directed at the left side of the ureteroileal anastomosis.  A 4  mm serosal excision was made at the level of the proximal conduit and 4 mucosal everting sutures were applied and attention was directed at mucosa-to-mucosa anastomosis of the left ureter.  A heel stitch was applied with interrupted Vicryl and a blue  colored bander stent to 25 cm.  The anastomosis was placed across the left side and further mucosa-to-mucosa anastomosis was performed using 2 separate running suture lines of running 4-0 Vicryl from the heel to the toe respectively, resulted in  excellent mucosal apposition without undue tension.  Similarly, right-sided ureteroileal anastomosis was performed as per the left but on the contralateral side of the proximal small bowel and using a red colored bander stent 25 cm to the anastomosis.   Both bander stents were anchored to the level of the conduit using an air knot through and through chromic suture to prevent inadvertent early displacement.  The previously marked stomal site was then excised circumferentially approximately 1/4 diameter  at the level of the fascia, dilated to accommodate 2 surgeon's fingers and 4 anchoring Vicryl fascial sutures were placed.  The conduit was then brought through this and anchored x4 to prevent peristomal hernia.  The area of ureteroileal anastomosis was  once again inspected at the area of the extraction site and the wound protector was then removed.  The extraction site was then covered with omentum, reapproximated to the level of the fascia using 8 interrupted Novafil given the patient's history of  prior hernia formation.  The Scarpa's was reapproximated with running Vicryl.  All incision sites were infiltrated with dilute lipolyzed Marcaine and closed level skin using subcuticular Monocryl.  Attention was directed to the maturation of the  stoma.   Four mucosal everting sutures were applied with Vicryl result an excellent rose budding of the stoma and intervening bowel mucosa to the skin interrupted sutures applied circumferentially.  The stoma appeared to be suitably pink and patent.  Stoma  appliance was placed.  There was excellent efflux of yellow urine around the distal end of the stents and the procedure terminated.    The patient tolerated the procedure well.  No immediate complications.  The patient was taken to postanesthesia care in stable condition.  AN/NUANCE  D:06/11/2018 T:06/11/2018 JOB:[id number]

## 2018-06-11 NOTE — Progress Notes (Signed)
May give additional 2 mg Dilaudid per Dr Lissa Hoard

## 2018-06-11 NOTE — Brief Op Note (Signed)
06/10/2018 - 06/11/2018  12:40 PM  PATIENT:  [name]  77 y.o. male  PRE-OPERATIVE DIAGNOSIS:  BLADDER CANCER  POST-OPERATIVE DIAGNOSIS:  BLADDER CANCER  PROCEDURE:  Procedure(s): XI ROBOT ASSISTED LAPAROSCOPIC RADICAL CYSTOPROSTATECTOMY BILATERAL PELVIC LYMPHADENECTOMY,NO ORTHOTOPIC NEOBLADDER - ILEAL (N/A) CYSTOSCOPY WITH INJECTION (N/A)  SURGEON:  Surgeon(s) and Role:    Alexis Frock, MD - Primary  PHYSICIAN ASSISTANT:   ASSISTANTS: none   ANESTHESIA:   local and general  EBL:  300 mL   BLOOD ADMINISTERED:none  DRAINS: 1 - JP to bulb, 2 - Urostomy to gravity with Rt (red) and Lt (blue) Bander stents   LOCAL MEDICATIONS USED:  MARCAINE     SPECIMEN:  Source of Specimen:  1 - ureteral margins, 2 - pelvic lymph nodes, 3 - cystoprostatectomy  DISPOSITION OF SPECIMEN:  PATHOLOGY  COUNTS:  YES  TOURNIQUET:  * No tourniquets in log *   DICTATION: .Other Dictation: Dictation Number [id number]  PLAN OF CARE: Admit to inpatient   PATIENT DISPOSITION:  PACU - hemodynamically stable.   Delay start of Pharmacological VTE agent (>24hrs) due to surgical blood loss or risk of bleeding: yes

## 2018-06-11 NOTE — Anesthesia Procedure Notes (Signed)
Arterial Line Insertion Performed by: Nolon Nations, MD  Patient location: Pre-op. Preanesthetic checklist: patient identified, IV checked, site marked, risks and benefits discussed, surgical consent, monitors and equipment checked, pre-op evaluation, timeout performed and anesthesia consent Lidocaine 1% used for infiltration Left, radial was placed Catheter size: 20 Fr Hand hygiene performed  and maximum sterile barriers used   Attempts: 1 Procedure performed without using ultrasound guided technique. Following insertion, dressing applied and Biopatch. Post procedure assessment: normal and unchanged  Patient tolerated the procedure well with no immediate complications.

## 2018-06-11 NOTE — Anesthesia Postprocedure Evaluation (Signed)
Anesthesia Post Note  Patient: [name]  Procedure(s) Performed: XI ROBOT ASSISTED LAPAROSCOPIC RADICAL CYSTOPROSTATECTOMY BILATERAL PELVIC LYMPHADENECTOMY,NO ORTHOTOPIC NEOBLADDER - ILEAL (N/A ) CYSTOSCOPY WITH INJECTION (N/A )     Patient location during evaluation: PACU Anesthesia Type: General Level of consciousness: sedated and patient cooperative Pain management: pain level controlled Vital Signs Assessment: post-procedure vital signs reviewed and stable Respiratory status: spontaneous breathing Cardiovascular status: stable Anesthetic complications: no Comments: Some left arm pain in PACU made worse by BP cuff. Improved through PACU stay.    Last Vitals:  Vitals:   06/11/18 1500 06/11/18 1505  BP: (!) 160/89   Pulse: (!) 107 (!) 103  Resp: 16 16  Temp: 36.5 C   SpO2: 90% 95%    Last Pain:  Vitals:   06/11/18 1505  TempSrc:   PainSc: 0-No pain                 Nolon Nations

## 2018-06-11 NOTE — Progress Notes (Signed)
Dr Tresa Moore at bedside. Aware of pressure, JP dng. No orders received.

## 2018-06-12 ENCOUNTER — Encounter (HOSPITAL_COMMUNITY): Payer: Self-pay | Admitting: Urology

## 2018-06-12 LAB — BASIC METABOLIC PANEL
ANION GAP: 7 (ref 5–15)
BUN: 19 mg/dL (ref 8–23)
CALCIUM: 8.9 mg/dL (ref 8.9–10.3)
CO2: 27 mmol/L (ref 22–32)
Chloride: 105 mmol/L (ref 98–111)
Creatinine, Ser: 1.35 mg/dL — ABNORMAL HIGH (ref 0.61–1.24)
GFR, EST AFRICAN AMERICAN: 58 mL/min — AB (ref >60)
GFR, EST NON AFRICAN AMERICAN: 50 mL/min — AB (ref >60)
Glucose, Bld: 129 mg/dL — ABNORMAL HIGH (ref 70–99)
POTASSIUM: 4.7 mmol/L (ref 3.5–5.1)
Sodium: 139 mmol/L (ref 135–145)

## 2018-06-12 LAB — CBC
HCT: 32.2 % — ABNORMAL LOW (ref 39.0–52.0)
HEMOGLOBIN: 10.9 g/dL — AB (ref 13.0–17.0)
MCH: 31.1 pg (ref 26.0–34.0)
MCHC: 33.9 g/dL (ref 30.0–36.0)
MCV: 91.7 fL (ref 78.0–100.0)
Platelets: 152 10*3/uL (ref 150–400)
RBC: 3.51 MIL/uL — AB (ref 4.22–5.81)
RDW: 12.6 % (ref 11.5–15.5)
WBC: 8.8 10*3/uL (ref 4.0–10.5)

## 2018-06-12 MED ORDER — ACETAMINOPHEN 500 MG PO TABS
1000.0000 mg | ORAL_TABLET | Freq: Four times a day (QID) | ORAL | Status: DC | PRN
  Administered 2018-06-12 – 2018-06-14 (×6): 1000 mg via ORAL
  Filled 2018-06-12 (×7): qty 2

## 2018-06-12 NOTE — Plan of Care (Signed)
  Problem: Education: Goal: Knowledge of General Education information will improve Description Including pain rating scale, medication(s)/side effects and non-pharmacologic comfort measures Outcome: Progressing   Problem: Activity: Goal: Risk for activity intolerance will decrease Outcome: Progressing   Problem: Coping: Goal: Level of anxiety will decrease Outcome: Progressing   Problem: Pain Managment: Goal: General experience of comfort will improve Outcome: Progressing   Problem: Clinical Measurements: Goal: Postoperative complications will be avoided or minimized Outcome: Nicut Katz, RN 06/12/18 3:44 PM

## 2018-06-12 NOTE — Progress Notes (Signed)
Assumed care of patient at 1500. Agree with previous Nurse assessment. Patient stable, will continue with current plan of care and encourage ambulation.  Barbee Shropshire. Brigitte Pulse, RN

## 2018-06-12 NOTE — Progress Notes (Signed)
Patient ambulatory in hallways to main nurses station and back to room with standby assist, tolerated well.

## 2018-06-12 NOTE — Progress Notes (Signed)
Assumed care of Pt. Agree with previous RN assessment. Pt in stable condition, will continue to monitor.

## 2018-06-12 NOTE — Progress Notes (Signed)
Patient ID: [name], male   DOB: 02-19-1941, 77 y.o.   MRN: [id number] 1 Day Post-Op  Assessment: Status post robotically assisted laparoscopic radical cystoprostatectomy and bilateral pelvic lymph node dissection with urinary diversion: He is doing phenomenally after his surgery.  He is having very little pain whatsoever.  He's got good urine output with a viable stoma and stents in place.  His drain output has been diminishing.  His hemoglobin is just down slightly from where it was yesterday but there is no sign of bleeding and his creatinine is unchanged from preop.  Plan:  1.  Ice chips. 2.  Transfer to floor. 3.  Begin ambulation and Hall. 4.  CBC and BMP in a.m.  Subjective: Patient is without complaint. Specifically he denies any nausea or vomiting.  He said he has very little or no pain whatsoever other than some slight discomfort in the lower abdomen.  He has not passed any flatus.  Objective: Vital signs in last 24 hours: Temp:  [97.7 F (36.5 C)-99.2 F (37.3 C)] 98.2 F (36.8 C) (08/31 0811) Pulse Rate:  [71-107] 71 (08/31 0200) Resp:  [9-25] 13 (08/31 0200) BP: (115-169)/(71-120) 127/71 (08/31 0200) SpO2:  [90 %-100 %] 98 % (08/31 0200) Arterial Line BP: (184-213)/(73-89) 184/73 (08/30 1400) Weight:  [76.3 kg] 76.3 kg (08/30 1505)  Intake/Output from previous day: 08/30 0701 - 08/31 0700 In: 4738.5 [I.V.:4483.6; IV Piggyback:254.9] Out: 3227 [Urine:2425; Drains:502; Blood:300] Intake/Output this shift: Total I/O In: -  Out: 65 [Drains:60]  Past Medical History:  Diagnosis Date  . Bladder cancer [facility]) dx'd 12/2017   urologist-  dr Tresa Moore  . BPH (benign prostatic hyperplasia)   . Full dentures   . GERD (gastroesophageal reflux disease)   . Hiatal hernia   . History of bladder stone   . History of kidney stones    per pt hx passed stones spontenaously  . Hypercholesterolemia   . Wears glasses   . Wears hearing aid in both ears    Current  Facility-Administered Medications  Medication Dose Route Frequency Provider Last Rate Last Dose  . 0.9 %  sodium chloride infusion   Intravenous Continuous Alexis Frock, MD   Stopped at 06/11/18 1027  . alvimopan (ENTEREG) capsule 12 mg  12 mg Oral BID Fredricka Bonine, MD   12 mg at 06/12/18 4401  . fenofibrate tablet 160 mg  160 mg Oral Jamal Collin, MD   160 mg at 06/11/18 2232  . lactated ringers infusion   Intravenous Continuous Fredricka Bonine, MD 100 mL/hr at 06/12/18 0600    . morphine 2 MG/ML injection 2-4 mg  2-4 mg Intravenous Q2H PRN Fredricka Bonine, MD      . ondansetron [facility]) injection 4 mg  4 mg Intravenous Q4H PRN Fredricka Bonine, MD      . pantoprazole (PROTONIX) EC tablet 40 mg  40 mg Oral Daily Alexis Frock, MD   40 mg at 06/12/18 0917  . simvastatin (ZOCOR) tablet 40 mg  40 mg Oral QHS Alexis Frock, MD   40 mg at 06/11/18 2232  . sodium chloride flush (NS) 0.9 % injection 10-40 mL  10-40 mL Intracatheter PRN Alexis Frock, MD        Physical Exam:  General: Patient is in no apparent distress Lungs: Normal respiratory effort, chest expands symmetrically. GI: The abdomen is soft and nontender without mass. No bowel sounds.  Ostomy appears viable with stents in place and clear urine.  Port sites are clean and dry.    Lab Results: Recent Labs    06/10/18 1330 06/11/18 1303 06/12/18 0322  WBC 5.1  --  8.8  HGB 13.5 12.5* 10.9*  HCT 40.9 37.3* 32.2*   BMET Recent Labs    06/11/18 1303 06/12/18 0322  NA 139 139  K 4.4 4.7  CL 109 105  CO2 24 27  GLUCOSE 168* 129*  BUN 19 19  CREATININE 1.32* 1.35*  CALCIUM 8.7* 8.9   No results for input(s): LABPT, INR in the last 72 hours. No results for input(s): LABURIN in the last 72 hours. Results for orders placed or performed during the hospital encounter of 06/10/18  Surgical pcr screen     Status: Abnormal   Collection Time: 06/10/18  3:50 PM  Result Value Ref Range Status   MRSA, PCR  NEGATIVE NEGATIVE Final   Staphylococcus aureus POSITIVE (A) NEGATIVE Final    Comment: (NOTE) The Xpert SA Assay (FDA approved for NASAL specimens in patients 77 years of age and older), is one component of a comprehensive surveillance program. It is not intended to diagnose infection nor to guide or monitor treatment. Performed at [facility], [place] [address], [place], [place] [address]     Studies/Results: No results found.     Marcellina Jonsson C 06/12/2018, 11:39 AM

## 2018-06-13 LAB — BASIC METABOLIC PANEL
Anion gap: 9 (ref 5–15)
BUN: 22 mg/dL (ref 8–23)
CHLORIDE: 102 mmol/L (ref 98–111)
CO2: 28 mmol/L (ref 22–32)
CREATININE: 1.29 mg/dL — AB (ref 0.61–1.24)
Calcium: 8.4 mg/dL — ABNORMAL LOW (ref 8.9–10.3)
GFR calc Af Amer: >60 mL/min (ref >60)
GFR calc non Af Amer: 53 mL/min — ABNORMAL LOW (ref >60)
GLUCOSE: 115 mg/dL — AB (ref 70–99)
POTASSIUM: 4 mmol/L (ref 3.5–5.1)
Sodium: 139 mmol/L (ref 135–145)

## 2018-06-13 LAB — CBC
HEMATOCRIT: 33.5 % — AB (ref 39.0–52.0)
Hemoglobin: 11 g/dL — ABNORMAL LOW (ref 13.0–17.0)
MCH: 30.4 pg (ref 26.0–34.0)
MCHC: 32.8 g/dL (ref 30.0–36.0)
MCV: 92.5 fL (ref 78.0–100.0)
PLATELETS: 168 10*3/uL (ref 150–400)
RBC: 3.62 MIL/uL — ABNORMAL LOW (ref 4.22–5.81)
RDW: 12.8 % (ref 11.5–15.5)
WBC: 9.9 10*3/uL (ref 4.0–10.5)

## 2018-06-13 MED ORDER — PIPERACILLIN-TAZOBACTAM 3.375 G IVPB 30 MIN: 3.3750 g | Freq: Three times a day (TID) | INTRAVENOUS | Status: DC

## 2018-06-13 MED ORDER — PIPERACILLIN-TAZOBACTAM 3.375 G IVPB
3.3750 g | Freq: Three times a day (TID) | INTRAVENOUS | Status: AC
  Administered 2018-06-13 – 2018-06-15 (×6): 3.375 g via INTRAVENOUS
  Filled 2018-06-13 (×7): qty 50

## 2018-06-13 MED ORDER — HEPARIN SODIUM (PORCINE) 5000 UNIT/ML IJ SOLN
5000.0000 [IU] | Freq: Three times a day (TID) | INTRAMUSCULAR | Status: DC
  Administered 2018-06-13 – 2018-06-16 (×10): 5000 [IU] via SUBCUTANEOUS
  Filled 2018-06-13 (×10): qty 1

## 2018-06-13 NOTE — Progress Notes (Signed)
2 Days Post-Op   Subjective/Chief Complaint:  1 - Bladder Cancer - s/p cystoprostatectomy with conduit diversion 06/11/18, admitted day prior for bowel prep, path pending.  2 - Post-Op Ileus - npo initially with entereg given bowel ansastamosis. Ice Chips POD 1, Clears POD 2.   3 - Disposition - pt lives independently at baseline with fantastic functional status. Ambulatory with minimal assistance POD 1. Ostomy RN helping in house with new teaching.  Today "[name]" is stable, some low grade fevers last 24 hrs, no localizing symptoms. Ambulatory, pain controlled on mostly just PO tylenol. NO flatus yet.    Objective: Vital signs in last 24 hours: Temp:  [98.4 F (36.9 C)-100 F (37.8 C)] 99.1 F (37.3 C) (09/01 0704) Pulse Rate:  [89-99] 89 (09/01 0607) Resp:  [18-20] 20 (09/01 0607) BP: (139-165)/(88-91) 139/89 (09/01 0607) SpO2:  [93 %-96 %] 93 % (09/01 0607) Weight:  [75.2 kg] 75.2 kg (08/31 1424) Last BM Date: 06/10/18  Intake/Output from previous day: 08/31 0701 - 09/01 0700 In: 3139.3 [I.V.:3139.3] Out: 2495 [Urine:2275; Drains:220] Intake/Output this shift: Total I/O In: -  Out: 102 [Drains:50]  General appearance: alert, cooperative, appears stated age and wife at bedside.  Eyes: negative Nose: Nares normal. Septum midline. Mucosa normal. No drainage or sinus tenderness. Throat: lips, mucosa, and tongue normal; teeth and gums normal Neck: supple, symmetrical, trachea midline Back: symmetric, no curvature. ROM normal. No CVA tenderness. Resp: non-labored on room air.  Cardio: Nl rate GI: soft, non-tender; bowel sounds normal; no masses,  no organomegaly Male genitalia: normal, scant spotting per urethra (normal followign this surgery) nearly resolved.  Extremities: extremities normal, atraumatic, no cyanosis or edema Pulses: 2+ and symmetric Lymph nodes: Cervical, supraclavicular, and axillary nodes normal. Neurologic: Grossly normal Incision/Wound: RLQ Urostomy  pink / patent with copious non-foul urine and scant mucus. Rt (red) and Lt (blue) stents in place.   Lab Results:  Recent Labs    06/12/18 0322 06/13/18 0355  WBC 8.8 9.9  HGB 10.9* 11.0*  HCT 32.2* 33.5*  PLT 152 168   BMET Recent Labs    06/12/18 0322 06/13/18 0355  NA 139 139  K 4.7 4.0  CL 105 102  CO2 27 28  GLUCOSE 129* 115*  BUN 19 22  CREATININE 1.35* 1.29*  CALCIUM 8.9 8.4*   PT/INR No results for input(s): LABPROT, INR in the last 72 hours. ABG No results for input(s): PHART, HCO3 in the last 72 hours.  Invalid input(s): PCO2, PO2  Studies/Results: No results found.  Anti-infectives: Anti-infectives (From admission, onward)   Start     Dose/Rate Route Frequency Ordered Stop   06/11/18 0600  piperacillin-tazobactam (ZOSYN) IVPB 3.375 g     3.375 g 100 mL/hr over 30 Minutes Intravenous 30 min pre-op 06/10/18 1313 06/11/18 0820   06/10/18 1330  neomycin (MYCIFRADIN) tablet 500 mg     500 mg Oral Every 6 hours 06/10/18 1317 06/10/18 2314   06/10/18 1330  metroNIDAZOLE (FLAGYL) tablet 500 mg     500 mg Oral Every 6 hours 06/10/18 1317 06/10/18 2314      Assessment/Plan:  1 - Bladder Cancer - Doing well POD 2. Restart Zosyn x 48 hours for low grade fevers, continue ambulation / IS. DDX atelectasis v. Early infection. NO SIRS symptoms.   2 - Post-Op Ileus - advance to clears and continue until flatus / BM. Continue entereg.   3 - Disposition - Remain in house. Will need HHRN for new ostomy teaching /  supplies at [place], but I do not forsee any additional needs.   [facility], Rayburn Mundis 06/13/2018
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
## 2018-06-13 NOTE — Plan of Care (Signed)
  Problem: Health Behavior/Discharge Planning: Goal: Ability to manage health-related needs will improve Outcome: Progressing   Problem: Clinical Measurements: Goal: Will remain free from infection Outcome: Progressing Goal: Respiratory complications will improve Outcome: Progressing   Problem: Elimination: Goal: Will not experience complications related to bowel motility Outcome: Progressing   Problem: Pain Managment: Goal: General experience of comfort will improve Outcome: Progressing   Problem: Skin Integrity: Goal: Risk for impaired skin integrity will decrease Outcome: Progressing   Problem: Skin Integrity: Goal: Demonstration of wound healing without infection will improve Outcome: Progressing   Carlynn Herald, RN  06/13/18 6:00 PM

## 2018-06-14 LAB — CBC
HEMATOCRIT: 32.7 % — AB (ref 39.0–52.0)
HEMOGLOBIN: 10.9 g/dL — AB (ref 13.0–17.0)
MCH: 30.7 pg (ref 26.0–34.0)
MCHC: 33.3 g/dL (ref 30.0–36.0)
MCV: 92.1 fL (ref 78.0–100.0)
Platelets: 175 10*3/uL (ref 150–400)
RBC: 3.55 MIL/uL — ABNORMAL LOW (ref 4.22–5.81)
RDW: 12.8 % (ref 11.5–15.5)
WBC: 8.7 10*3/uL (ref 4.0–10.5)

## 2018-06-14 LAB — BASIC METABOLIC PANEL
ANION GAP: 6 (ref 5–15)
BUN: 18 mg/dL (ref 8–23)
CO2: 30 mmol/L (ref 22–32)
Calcium: 8.7 mg/dL — ABNORMAL LOW (ref 8.9–10.3)
Chloride: 106 mmol/L (ref 98–111)
Creatinine, Ser: 1.34 mg/dL — ABNORMAL HIGH (ref 0.61–1.24)
GFR calc Af Amer: 58 mL/min — ABNORMAL LOW (ref >60)
GFR calc non Af Amer: 50 mL/min — ABNORMAL LOW (ref >60)
GLUCOSE: 123 mg/dL — AB (ref 70–99)
POTASSIUM: 3.9 mmol/L (ref 3.5–5.1)
Sodium: 142 mmol/L (ref 135–145)

## 2018-06-14 NOTE — Progress Notes (Signed)
3 Days Post-Op   Subjective/Chief Complaint:   1 - Bladder Cancer - s/p cystoprostatectomy with conduit diversion 06/11/18, admitted day prior for bowel prep, path pending.  2 - Post-Op Ileus - npo initially with entereg given bowel ansastamosis. Ice Chips POD 1, Clears POD 2.   3 - Disposition - pt lives independently at baseline with fantastic functional status. Ambulatory with minimal assistance POD 1. Ostomy RN helping in house with new teaching.  Today "[name]" is without compliants. Emesis x1 early AM after pain meds and lab draw. He does report some flatus x several.    Objective: Vital signs in last 24 hours: Temp:  [98.9 F (37.2 C)-100.4 F (38 C)] 98.9 F (37.2 C) (09/02 0519) Pulse Rate:  [79-90] 79 (09/02 0519) Resp:  [18] 18 (09/02 0519) BP: (136-152)/(92-95) 152/93 (09/02 0519) SpO2:  [95 %-96 %] 95 % (09/02 0519) Last BM Date: 06/13/18  Intake/Output from previous day: 09/01 0701 - 09/02 0700 In: 3243.3 [P.O.:485; I.V.:2708.3; IV Piggyback:50] Out: 8756 [Urine:1250; Drains:325] Intake/Output this shift: No intake/output data recorded.   General appearance: alert, cooperative, appears stated age and wife at bedside.  Eyes: negative Nose: Nares normal. Septum midline. Mucosa normal. No drainage or sinus tenderness. Throat: lips, mucosa, and tongue normal; teeth and gums normal Neck: supple, symmetrical, trachea midline Back: symmetric, no curvature. ROM normal. No CVA tenderness. Resp: non-labored on room air.  Cardio: Nl rate GI: soft, non-tender; bowel sounds normal; no masses,  no organomegaly Male genitalia: normal, scant spotting per urethra (normal followign this surgery) nearly resolved.  Extremities: extremities normal, atraumatic, no cyanosis or edema Pulses: 2+ and symmetric Lymph nodes: Cervical, supraclavicular, and axillary nodes normal. Neurologic: Grossly normal Incision/Wound: RLQ Urostomy pink / patent with copious non-foul urine and scant  mucus. Rt (red) and Lt (blue) stents in place.    Lab Results:  Recent Labs    06/13/18 0355 06/14/18 0443  WBC 9.9 8.7  HGB 11.0* 10.9*  HCT 33.5* 32.7*  PLT 168 175   BMET Recent Labs    06/13/18 0355 06/14/18 0443  NA 139 142  K 4.0 3.9  CL 102 106  CO2 28 30  GLUCOSE 115* 123*  BUN 22 18  CREATININE 1.29* 1.34*  CALCIUM 8.4* 8.7*   PT/INR No results for input(s): LABPROT, INR in the last 72 hours. ABG No results for input(s): PHART, HCO3 in the last 72 hours.  Invalid input(s): PCO2, PO2  Studies/Results: No results found.  Anti-infectives: Anti-infectives (From admission, onward)   Start     Dose/Rate Route Frequency Ordered Stop   06/13/18 1000  piperacillin-tazobactam (ZOSYN) IVPB 3.375 g     3.375 g 12.5 mL/hr over 240 Minutes Intravenous Every 8 hours 06/13/18 0946 06/15/18 0959   06/13/18 0945  piperacillin-tazobactam (ZOSYN) IVPB 3.375 g  Status:  Discontinued     3.375 g 100 mL/hr over 30 Minutes Intravenous Every 8 hours 06/13/18 0940 06/13/18 0944   06/11/18 0600  piperacillin-tazobactam (ZOSYN) IVPB 3.375 g     3.375 g 100 mL/hr over 30 Minutes Intravenous 30 min pre-op 06/10/18 1313 06/11/18 0820   06/10/18 1330  neomycin (MYCIFRADIN) tablet 500 mg     500 mg Oral Every 6 hours 06/10/18 1317 06/10/18 2314   06/10/18 1330  metroNIDAZOLE (FLAGYL) tablet 500 mg     500 mg Oral Every 6 hours 06/10/18 1317 06/10/18 2314      Assessment/Plan:  1 - Bladder Cancer - Doing well POD 3. Continue Zosyn  x another 24 hours for low grade fevers, continue ambulation / IS. DDX atelectasis v. Early infection. NO SIRS symptoms.   2 - Post-Op Ileus - continue clears until BM or resolution of nausea. Continue entereg.  3 - Disposition - Remain in house. Will need HHRN for new ostomy teaching / supplies at [place], but I do not forsee any additional needs.   [facility], [name] 06/14/2018

## 2018-06-15 LAB — CBC
HEMATOCRIT: 31.3 % — AB (ref 39.0–52.0)
Hemoglobin: 10.4 g/dL — ABNORMAL LOW (ref 13.0–17.0)
MCH: 30.7 pg (ref 26.0–34.0)
MCHC: 33.2 g/dL (ref 30.0–36.0)
MCV: 92.3 fL (ref 78.0–100.0)
PLATELETS: 178 10*3/uL (ref 150–400)
RBC: 3.39 MIL/uL — AB (ref 4.22–5.81)
RDW: 12.8 % (ref 11.5–15.5)
WBC: 6.5 10*3/uL (ref 4.0–10.5)

## 2018-06-15 LAB — BASIC METABOLIC PANEL
Anion gap: 8 (ref 5–15)
BUN: 14 mg/dL (ref 8–23)
CO2: 26 mmol/L (ref 22–32)
CREATININE: 1.35 mg/dL — AB (ref 0.61–1.24)
Calcium: 8.4 mg/dL — ABNORMAL LOW (ref 8.9–10.3)
Chloride: 109 mmol/L (ref 98–111)
GFR, EST AFRICAN AMERICAN: 58 mL/min — AB (ref >60)
GFR, EST NON AFRICAN AMERICAN: 50 mL/min — AB (ref >60)
Glucose, Bld: 107 mg/dL — ABNORMAL HIGH (ref 70–99)
POTASSIUM: 3.8 mmol/L (ref 3.5–5.1)
SODIUM: 143 mmol/L (ref 135–145)

## 2018-06-15 LAB — CREATININE, FLUID (PLEURAL, PERITONEAL, JP DRAINAGE): CREAT FL: 1.3 mg/dL

## 2018-06-15 MED ORDER — CHLORHEXIDINE GLUCONATE CLOTH 2 % EX PADS
6.0000 | MEDICATED_PAD | Freq: Every day | CUTANEOUS | Status: DC
  Administered 2018-06-15 – 2018-06-16 (×2): 6 via TOPICAL

## 2018-06-15 MED ORDER — MUPIROCIN 2 % EX OINT
1.0000 "application " | TOPICAL_OINTMENT | Freq: Two times a day (BID) | CUTANEOUS | Status: DC
  Administered 2018-06-15 – 2018-06-16 (×3): 1 via NASAL
  Filled 2018-06-15 (×2): qty 22

## 2018-06-15 NOTE — Care Management Note (Signed)
Case Management Note  Patient Details  Name: [name] MRN: [id number] Date of Birth: Feb 20, 1941  Subjective/Objective: Per MD note-HHRN for ostomy teaching/supplies-MD notifed for HHRN,face to face order. Patient chose [place] rep Santiago Glad aware of referral.                    Action/Plan:d/c home w/HHC   Expected Discharge Date:  (unknown)               Expected Discharge Plan:  Home/Self Care  In-House Referral:     Discharge planning Services  CM Consult  Post Acute Care Choice:    Choice offered to:  Patient  DME Arranged:    DME Agency:     HH Arranged:  RN [place] Agency:  [place]  Status of Service:  In process, will continue to follow  If discussed at Long Length of Stay Meetings, dates discussed:    Additional Comments:  Dessa Phi, RN 06/15/2018, 3:35 PM

## 2018-06-15 NOTE — Care Management Note (Signed)
Case Management Note  Patient Details  Name: ALOIS COLGAN MRN: [id number] Date of Birth: 1941-05-14  Subjective/Objective: s/o cystoprostatectomy,ileal conduit. No CM needs.                   Action/Plan:d/c home.   Expected Discharge Date:  (unknown)               Expected Discharge Plan:  Home/Self Care  In-House Referral:     Discharge planning Services  CM Consult  Post Acute Care Choice:    Choice offered to:     DME Arranged:    DME Agency:     HH Arranged:    HH Agency:     Status of Service:  In process, will continue to follow  If discussed at Long Length of Stay Meetings, dates discussed:    Additional Comments:  Dessa Phi, RN 06/15/2018, 11:09 AM

## 2018-06-15 NOTE — Consult Note (Signed)
[place] Nurse ostomy follow up Stoma type/location: RLQ, ileal conduit  Stomal assessment/size: 1 1/4" round, os pointing at 6 o'clock, two stents in place 1 red/1 blue.  Red stent is much longer than blue at the time of my assessment today  Peristomal assessment: intact  Treatment options for stomal/peristomal skin: using 2" barrier ring Output yellow urine, minimal sediment or mucous Ostomy pouching: 1pc.convex with 2" barrier ring Education provided:  Explained creation of stoma in more detail related to patient and wife questions  Explained stoma characteristics (budded, flush, color, texture, care) Described and demonstrated where the os of the stoma is  Observed wife perform pouch change (cutting new barrier, cleaning peristomal skin and stoma, use of barrier ring) Demonstrated on use wick in stoma to keep skin dry with pouch change Education on emptying when 1/3 to 1/2 full and how to empty Education on urine characteristics (sediment, mucous) Wife and patient independent with  hooking pouch to nighttime drainage bag Discussed bathing, and risk of dehydration  Discussed risk of peristomal hernia Answered patient/family questions R/T: Travel, diet and use of alcohol, activity, ability to shower, s/s of infection (UTI), frequency of pouch change, insurance coverage, clothing choices. Cleaning and use of BSD bag. Spent 1.5 hours with patient and his wife. Wife performed pouch change with very little assistance from the [facility] nurse.  Enrolled patient in [place] Start Discharge program: Yes  [place] Nurse will follow along with you for continued support with ostomy teaching and care [place] MSN, [place], [place], [place], [place]

## 2018-06-15 NOTE — Progress Notes (Signed)
4 Days Post-Op   Subjective/Chief Complaint:   1 - Bladder Cancer - s/p cystoprostatectomy with conduit diversion 06/11/18, admitted day prior for bowel prep, path pending.  2 - Post-Op Ileus - npo initially with entereg given bowel ansastamosis. Ice Chips POD 1, Clears POD 2. +BM and advanced to reg on POD4  3 - Disposition - pt lives independently at baseline with fantastic functional status. Ambulatory with minimal assistance POD 1. Ostomy RN helping in house with new teaching.  Today "[name]" is without compliants. No further nausea or vomiting. Tolerating clears. +large BM yesterday evening. Ambulating x4/day. Pain is minimal and well controlled. Interested in trying solid food this AM.    Objective: Vital signs in last 24 hours: Temp:  [98.5 F (36.9 C)-99 F (37.2 C)] 99 F (37.2 C) (09/03 0093) Pulse Rate:  [74-77] 74 (09/03 0613) Resp:  [18] 18 (09/03 0613) BP: (126-155)/(77-91) 148/87 (09/03 0613) SpO2:  [95 %-99 %] 95 % (09/03 0613) Last BM Date: 06/13/18  Intake/Output from previous day: 09/02 0701 - 09/03 0700 In: 1530 [P.O.:130; I.V.:1400] Out: 1460 [Urine:1200; Drains:260] Intake/Output this shift: No intake/output data recorded.   General appearance: alert, cooperative, appears stated age and wife at bedside.  Eyes: negative Nose: Nares normal. Septum midline. Mucosa normal. No drainage or sinus tenderness. Throat: lips, mucosa, and tongue normal; teeth and gums normal Neck: supple, symmetrical, trachea midline Back: symmetric, no curvature. ROM normal. No CVA tenderness. Resp: non-labored on room air.  Cardio: Nl rate GI: soft, non-tender; bowel sounds normal; no masses,  no organomegaly Male genitalia: normal, scant spotting per urethra (normal followign this surgery) nearly resolved.  Extremities: extremities normal, atraumatic, no cyanosis or edema Pulses: 2+ and symmetric Lymph nodes: Cervical, supraclavicular, and axillary nodes normal. Neurologic:  Grossly normal Incision/Wound: RLQ Urostomy pink / patent with copious non-foul urine and scant mucus. Rt (red) and Lt (blue) stents in place.    Lab Results:  Recent Labs    06/14/18 0443 06/15/18 0317  WBC 8.7 6.5  HGB 10.9* 10.4*  HCT 32.7* 31.3*  PLT 175 178   BMET Recent Labs    06/14/18 0443 06/15/18 0317  NA 142 143  K 3.9 3.8  CL 106 109  CO2 30 26  GLUCOSE 123* 107*  BUN 18 14  CREATININE 1.34* 1.35*  CALCIUM 8.7* 8.4*   PT/INR No results for input(s): LABPROT, INR in the last 72 hours. ABG No results for input(s): PHART, HCO3 in the last 72 hours.  Invalid input(s): PCO2, PO2  Studies/Results: No results found.  Anti-infectives: Anti-infectives (From admission, onward)   Start     Dose/Rate Route Frequency Ordered Stop   06/13/18 1000  piperacillin-tazobactam (ZOSYN) IVPB 3.375 g     3.375 g 12.5 mL/hr over 240 Minutes Intravenous Every 8 hours 06/13/18 0946 06/15/18 0525   06/13/18 0945  piperacillin-tazobactam (ZOSYN) IVPB 3.375 g  Status:  Discontinued     3.375 g 100 mL/hr over 30 Minutes Intravenous Every 8 hours 06/13/18 0940 06/13/18 0944   06/11/18 0600  piperacillin-tazobactam (ZOSYN) IVPB 3.375 g     3.375 g 100 mL/hr over 30 Minutes Intravenous 30 min pre-op 06/10/18 1313 06/11/18 0820   06/10/18 1330  neomycin (MYCIFRADIN) tablet 500 mg     500 mg Oral Every 6 hours 06/10/18 1317 06/10/18 2314   06/10/18 1330  metroNIDAZOLE (FLAGYL) tablet 500 mg     500 mg Oral Every 6 hours 06/10/18 1317 06/10/18 2314  Assessment/Plan:  1 - Bladder Cancer - Doing well POD 4. Ambulating frequently, pain minimal, progressing well.   2 - Post-Op Ileus - +large BM on POD3, advance to regular diet this AM POD4. DC entereg. Will drop mIVF  3 - Disposition - Remain in house. Will need HHRN for new ostomy teaching / supplies at [place], but I do not forsee any additional needs.   Fredricka Bonine 06/15/2018

## 2018-06-16 LAB — BASIC METABOLIC PANEL
Anion gap: 7 (ref 5–15)
BUN: 13 mg/dL (ref 8–23)
CALCIUM: 8.1 mg/dL — AB (ref 8.9–10.3)
CO2: 26 mmol/L (ref 22–32)
CREATININE: 1.16 mg/dL (ref 0.61–1.24)
Chloride: 108 mmol/L (ref 98–111)
GFR calc Af Amer: >60 mL/min (ref >60)
GFR calc non Af Amer: 60 mL/min — ABNORMAL LOW (ref >60)
Glucose, Bld: 155 mg/dL — ABNORMAL HIGH (ref 70–99)
Potassium: 3.1 mmol/L — ABNORMAL LOW (ref 3.5–5.1)
Sodium: 141 mmol/L (ref 135–145)

## 2018-06-16 LAB — CBC
HCT: 30.8 % — ABNORMAL LOW (ref 39.0–52.0)
Hemoglobin: 10.3 g/dL — ABNORMAL LOW (ref 13.0–17.0)
MCH: 30.7 pg (ref 26.0–34.0)
MCHC: 33.4 g/dL (ref 30.0–36.0)
MCV: 91.7 fL (ref 78.0–100.0)
PLATELETS: 186 10*3/uL (ref 150–400)
RBC: 3.36 MIL/uL — ABNORMAL LOW (ref 4.22–5.81)
RDW: 12.9 % (ref 11.5–15.5)
WBC: 6.3 10*3/uL (ref 4.0–10.5)

## 2018-06-16 MED ORDER — SENNOSIDES-DOCUSATE SODIUM 8.6-50 MG PO TABS: 1.0000 | ORAL_TABLET | Freq: Two times a day (BID) | ORAL | 0 refills | Status: DC

## 2018-06-16 MED ORDER — HEPARIN SOD (PORK) LOCK FLUSH 100 UNIT/ML IV SOLN
500.0000 [IU] | Freq: Once | INTRAVENOUS | Status: AC
  Administered 2018-06-16: 500 [IU] via INTRAVENOUS
  Filled 2018-06-16 (×2): qty 5

## 2018-06-16 MED ORDER — OXYCODONE-ACETAMINOPHEN 5-325 MG PO TABS: 1.0000 | ORAL_TABLET | Freq: Four times a day (QID) | ORAL | 0 refills | Status: AC | PRN

## 2018-06-16 NOTE — Care Management Important Message (Signed)
Important Message  Patient Details  Name: LAYMON STOCKERT MRN: [id number] Date of Birth: May 07, 1941   Medicare Important Message Given:  Yes    Kerin Salen 06/16/2018, 12:34 [place] Message  Patient Details  Name: RECTOR DEVONSHIRE MRN: [id number] Date of Birth: Jan 30, 1941   Medicare Important Message Given:  Yes    Kerin Salen 06/16/2018, 12:34 PM

## 2018-06-16 NOTE — Discharge Summary (Signed)
Physician Discharge Summary  Patient ID: [name] MRN: [id number] DOB/AGE: [birth year] 77 y.o.  Admit date: 06/10/2018 Discharge date: 06/16/2018  Admission Diagnoses: Bladder Cancer  Discharge Diagnoses:  Active Problems:   Bladder cancer [facility])   Discharged Condition: good  Hospital Course:    1 - Bladder Cancer - s/p cystoprostatectomy with conduit diversion 06/11/18, admitted day prior for bowel prep, path pending. JP removed POD 4 as Cr same as serum.   2 - Post-Op Ileus - npo initially with entereg given bowel ansastamosis. Ice Chips POD 1, Clears POD 2. Reg diet POD 4 after resumed bowel function.    3 - Disposition - pt lives independently at baseline with fantastic functional status. Ambulatory with minimal assistance POD 1. Ostomy RN helping in house with new teaching. Has HHRN arranged for discharge.  By the afternoon of 06/16/18, the day of discharge, he is ambulatory, pain controlled on PO meds, maintaining PO nutrition, and felt to be adequate for discharge.     Consults: case managment  Significant Diagnostic Studies: labs: as per above  Treatments: surgery: as per above  Discharge Exam: Blood pressure 127/82, pulse 73, temperature 98.8 F (37.1 C), temperature source Oral, resp. rate 18, height 5\' 8"  (1.727 m), weight 75.2 kg, SpO2 98 %.  General appearance: alert, cooperative, appears stated age and wife at bedside.  Eyes: negative Nose: Nares normal. Septum midline. Mucosa normal. No drainage or sinus tenderness. Throat: lips, mucosa, and tongue normal; teeth and gums normal Neck: supple, symmetrical, trachea midline Back: symmetric, no curvature. ROM normal. No CVA tenderness. Resp: non-labored on room air.  Cardio: Nl rate GI: soft, non-tender; bowel sounds normal; no masses,  no organomegaly Male genitalia: normal,  Extremities: extremities normal, atraumatic, no cyanosis or edema Pulses: 2+ and symmetric Lymph nodes: Cervical, supraclavicular,  and axillary nodes normal. Neurologic: Grossly normal Incision/Wound: RLQ Urostomy pink / patent with copious non-foul urine and scant mucus. Rt (red) and Lt (blue) stents in place. JP site c/d/i with dressing in place.   Disposition: Home with home health    [place] Follow up.   Specialty:  [place] Why:  [facility] nursing Contact information: [address] [place] [address] [phone]           Signed: Alexis Frock 06/16/2018, 2:57 PM

## 2018-06-16 NOTE — Discharge Instructions (Signed)

## 2018-06-16 NOTE — Consult Note (Signed)
[place] Nurse ostomy follow up Stoma type/location: RLQ, ileal conduit Stomal assessment/size: 1 1/4" budded, os at 6 o'clock. With stents in place  JP drain removed from the LLQ last evening.   Peristomal assessment: NA, no pouch change today Treatment options for stomal/peristomal skin:  Output  Yellow urine.  Ostomy pouching: 1pc.convex with barrier ring   Education provided:  Discussed other questions from patient and his wife about pouch changes, frequency and supplies. It is noted while [place] nurse in the room that his JP site is leaking quite a bit.  I have cleaned the site and applied dry gauze, covered with silicone foam. Bedside nurse in to change linen and gown.  She is aware of the change of the dressing. The drainage was clear/serousanginous. Upon palpation of the tissues around the site no additional drainage noted. Instructed patient and wife to keep covered when he showers at home. Noted by wife also new rash on patient's back, it is not pruritic or open. It is centrally located on the patient's back. Patient does not wish to request any treatment for this at this time. The rash is not noted anywhere else on the patient.  Enrolled patient in [place] Start Discharge program: Yes   Patient has [place] arranged, plans for DC later today.  [place], [place], [place]

## 2018-06-17 DIAGNOSIS — E785 Hyperlipidemia, unspecified: Secondary | ICD-10-CM | POA: Diagnosis not present

## 2018-06-17 DIAGNOSIS — C679 Malignant neoplasm of bladder, unspecified: Secondary | ICD-10-CM | POA: Diagnosis not present

## 2018-06-17 DIAGNOSIS — Z87891 Personal history of nicotine dependence: Secondary | ICD-10-CM | POA: Diagnosis not present

## 2018-06-17 DIAGNOSIS — Z960 Presence of urogenital implants: Secondary | ICD-10-CM | POA: Diagnosis not present

## 2018-06-17 DIAGNOSIS — K219 Gastro-esophageal reflux disease without esophagitis: Secondary | ICD-10-CM | POA: Diagnosis not present

## 2018-06-17 DIAGNOSIS — Z436 Encounter for attention to other artificial openings of urinary tract: Secondary | ICD-10-CM | POA: Diagnosis not present

## 2018-06-17 DIAGNOSIS — Z483 Aftercare following surgery for neoplasm: Secondary | ICD-10-CM | POA: Diagnosis not present

## 2018-06-17 DIAGNOSIS — N183 Chronic kidney disease, stage 3 (moderate): Secondary | ICD-10-CM | POA: Diagnosis not present

## 2018-06-19 DIAGNOSIS — N183 Chronic kidney disease, stage 3 (moderate): Secondary | ICD-10-CM | POA: Diagnosis not present

## 2018-06-19 DIAGNOSIS — Z87891 Personal history of nicotine dependence: Secondary | ICD-10-CM | POA: Diagnosis not present

## 2018-06-19 DIAGNOSIS — Z483 Aftercare following surgery for neoplasm: Secondary | ICD-10-CM | POA: Diagnosis not present

## 2018-06-19 DIAGNOSIS — E785 Hyperlipidemia, unspecified: Secondary | ICD-10-CM | POA: Diagnosis not present

## 2018-06-19 DIAGNOSIS — C679 Malignant neoplasm of bladder, unspecified: Secondary | ICD-10-CM | POA: Diagnosis not present

## 2018-06-19 DIAGNOSIS — Z960 Presence of urogenital implants: Secondary | ICD-10-CM | POA: Diagnosis not present

## 2018-06-19 DIAGNOSIS — Z436 Encounter for attention to other artificial openings of urinary tract: Secondary | ICD-10-CM | POA: Diagnosis not present

## 2018-06-19 DIAGNOSIS — K219 Gastro-esophageal reflux disease without esophagitis: Secondary | ICD-10-CM | POA: Diagnosis not present

## 2018-06-22 DIAGNOSIS — K219 Gastro-esophageal reflux disease without esophagitis: Secondary | ICD-10-CM | POA: Diagnosis not present

## 2018-06-22 DIAGNOSIS — Z87891 Personal history of nicotine dependence: Secondary | ICD-10-CM | POA: Diagnosis not present

## 2018-06-22 DIAGNOSIS — Z436 Encounter for attention to other artificial openings of urinary tract: Secondary | ICD-10-CM | POA: Diagnosis not present

## 2018-06-22 DIAGNOSIS — N183 Chronic kidney disease, stage 3 (moderate): Secondary | ICD-10-CM | POA: Diagnosis not present

## 2018-06-22 DIAGNOSIS — C679 Malignant neoplasm of bladder, unspecified: Secondary | ICD-10-CM | POA: Diagnosis not present

## 2018-06-22 DIAGNOSIS — Z960 Presence of urogenital implants: Secondary | ICD-10-CM | POA: Diagnosis not present

## 2018-06-22 DIAGNOSIS — Z483 Aftercare following surgery for neoplasm: Secondary | ICD-10-CM | POA: Diagnosis not present

## 2018-06-22 DIAGNOSIS — E785 Hyperlipidemia, unspecified: Secondary | ICD-10-CM | POA: Diagnosis not present

## 2018-06-23 DIAGNOSIS — Z483 Aftercare following surgery for neoplasm: Secondary | ICD-10-CM | POA: Diagnosis not present

## 2018-06-23 DIAGNOSIS — Z436 Encounter for attention to other artificial openings of urinary tract: Secondary | ICD-10-CM | POA: Diagnosis not present

## 2018-06-24 DIAGNOSIS — Z483 Aftercare following surgery for neoplasm: Secondary | ICD-10-CM | POA: Diagnosis not present

## 2018-06-24 DIAGNOSIS — C679 Malignant neoplasm of bladder, unspecified: Secondary | ICD-10-CM | POA: Diagnosis not present

## 2018-06-24 DIAGNOSIS — N183 Chronic kidney disease, stage 3 (moderate): Secondary | ICD-10-CM | POA: Diagnosis not present

## 2018-06-24 DIAGNOSIS — Z436 Encounter for attention to other artificial openings of urinary tract: Secondary | ICD-10-CM | POA: Diagnosis not present

## 2018-06-24 DIAGNOSIS — K219 Gastro-esophageal reflux disease without esophagitis: Secondary | ICD-10-CM | POA: Diagnosis not present

## 2018-06-24 DIAGNOSIS — E785 Hyperlipidemia, unspecified: Secondary | ICD-10-CM | POA: Diagnosis not present

## 2018-06-24 DIAGNOSIS — Z960 Presence of urogenital implants: Secondary | ICD-10-CM | POA: Diagnosis not present

## 2018-06-24 DIAGNOSIS — Z87891 Personal history of nicotine dependence: Secondary | ICD-10-CM | POA: Diagnosis not present

## 2018-06-29 ENCOUNTER — Telehealth: Payer: Self-pay

## 2018-06-29 ENCOUNTER — Inpatient Hospital Stay: Payer: Medicare Other

## 2018-06-29 ENCOUNTER — Inpatient Hospital Stay: Payer: Medicare Other | Attending: Oncology

## 2018-06-29 ENCOUNTER — Inpatient Hospital Stay (HOSPITAL_BASED_OUTPATIENT_CLINIC_OR_DEPARTMENT_OTHER): Payer: Medicare Other | Admitting: Oncology

## 2018-06-29 VITALS — BP 134/92 | HR 93 | Temp 97.9°F | Resp 17 | Ht 68.0 in | Wt 155.2 lb

## 2018-06-29 DIAGNOSIS — Z8546 Personal history of malignant neoplasm of prostate: Secondary | ICD-10-CM

## 2018-06-29 DIAGNOSIS — D649 Anemia, unspecified: Secondary | ICD-10-CM | POA: Insufficient documentation

## 2018-06-29 DIAGNOSIS — C671 Malignant neoplasm of dome of bladder: Secondary | ICD-10-CM

## 2018-06-29 DIAGNOSIS — Z8551 Personal history of malignant neoplasm of bladder: Secondary | ICD-10-CM | POA: Diagnosis not present

## 2018-06-29 DIAGNOSIS — Z95828 Presence of other vascular implants and grafts: Secondary | ICD-10-CM

## 2018-06-29 LAB — CBC WITH DIFFERENTIAL (CANCER CENTER ONLY)
Basophils Absolute: 0 10*3/uL (ref 0.0–0.1)
Basophils Relative: 0 %
Eosinophils Absolute: 0.4 10*3/uL (ref 0.0–0.5)
Eosinophils Relative: 5 %
HEMATOCRIT: 33.4 % — AB (ref 38.4–49.9)
HEMOGLOBIN: 10.9 g/dL — AB (ref 13.0–17.1)
LYMPHS ABS: 1.3 10*3/uL (ref 0.9–3.3)
LYMPHS PCT: 18 %
MCH: 29.3 pg (ref 27.2–33.4)
MCHC: 32.6 g/dL (ref 32.0–36.0)
MCV: 89.8 fL (ref 79.3–98.0)
MONOS PCT: 8 %
Monocytes Absolute: 0.6 10*3/uL (ref 0.1–0.9)
NEUTROS ABS: 5.1 10*3/uL (ref 1.5–6.5)
NEUTROS PCT: 69 %
Platelet Count: 288 10*3/uL (ref 140–400)
RBC: 3.72 MIL/uL — AB (ref 4.20–5.82)
RDW: 12.7 % (ref 11.0–14.6)
WBC: 7.3 10*3/uL (ref 4.0–10.3)

## 2018-06-29 LAB — CMP (CANCER CENTER ONLY)
ALK PHOS: 86 U/L (ref 38–126)
ALT: 14 U/L (ref 0–44)
AST: 15 U/L (ref 15–41)
Albumin: 3.3 g/dL — ABNORMAL LOW (ref 3.5–5.0)
Anion gap: 10 (ref 5–15)
BILIRUBIN TOTAL: 0.9 mg/dL (ref 0.3–1.2)
BUN: 24 mg/dL — ABNORMAL HIGH (ref 8–23)
CALCIUM: 9.8 mg/dL (ref 8.9–10.3)
CO2: 26 mmol/L (ref 22–32)
CREATININE: 1.36 mg/dL — AB (ref 0.61–1.24)
Chloride: 105 mmol/L (ref 98–111)
GFR, EST AFRICAN AMERICAN: 57 mL/min — AB (ref >60)
GFR, Estimated: 49 mL/min — ABNORMAL LOW (ref >60)
Glucose, Bld: 103 mg/dL — ABNORMAL HIGH (ref 70–99)
Potassium: 4.2 mmol/L (ref 3.5–5.1)
SODIUM: 141 mmol/L (ref 135–145)
TOTAL PROTEIN: 7 g/dL (ref 6.5–8.1)

## 2018-06-29 MED ORDER — HEPARIN SOD (PORK) LOCK FLUSH 100 UNIT/ML IV SOLN
250.0000 [IU] | Freq: Once | INTRAVENOUS | Status: AC
  Administered 2018-06-29: 250 [IU]
  Filled 2018-06-29: qty 5

## 2018-06-29 MED ORDER — SODIUM CHLORIDE 0.9% FLUSH
10.0000 mL | Freq: Once | INTRAVENOUS | Status: AC
  Administered 2018-06-29: 10 mL
  Filled 2018-06-29: qty 10

## 2018-06-29 NOTE — Telephone Encounter (Signed)
Printed avs and calender of upcoming appointment. Per 9/17 los 

## 2018-06-29 NOTE — Progress Notes (Signed)
Hematology and Oncology Follow Up Visit  [name] [id number] 09-12-1941 77 y.o. 06/29/2018 8:17 AM [name], MDHall, Damon Areola, MD   Principle Diagnosis: 77 year old man with high-grade urothelial carcinoma of the bladder cancer diagnosed in March 2019.  He was initially thought to have a T2N0.  Prior Therapy:  He is status post TURBT on December 11, 2017.  The  pathology showed high-grade invasive urothelial carcinoma at the base of the bladder.   He is status post 4 cycles of neoadjuvant chemotherapy utilizing cisplatin and gemcitabine concluded on 03/24/2018.  He is status post cystoprostatectomy completed on June 11, 2018 with the final pathology showed carcinoma in situ only residual disease.  With 0 out of 18 lymph nodes involvement of malignancy.  He was also found to have Gleason score 3+4 = 7 prostate cancer.  Current therapy: Active surveillance.  Interim History: [name] is here for a follow-up visit.  Since last visit, he underwent cystoprostatectomy on June 11, 2018 and tolerated very well.  He did not report any postoperative complications and fully recovered.  He was discharged on 06/16/2018.  Since his discharge, he reports some fatigue and tiredness and lost about 13 pounds but is improving rapidly.  His appetite is better and his mobility is also improved.  His performance status continues to be excellent.  he does not report any headaches, blurry vision, syncope or seizures.  He reports no dizziness or alteration in mental status.  Does not report any fevers, chills or sweats. Does not report any cough, wheezing or hemoptysis.  Does not report any chest pain, palpitation, orthopnea or leg edema.  Does not report any nausea, vomiting or abdominal pain.  He denied any ranges in his bowel habits.  Does not report any bone pain or pathological fractures.  Does not report frequency, urgency or hematuria.  Does not report any skin rashes or lesions. .  Does not report any  bleeding or clotting tendencies.  Does not report any easy bruising.  He denies any depression or anxiety.  Remaining review of systems is negative.    Medications: I have reviewed the patient's current medications.  Current Outpatient Medications  Medication Sig Dispense Refill  . fenofibrate 160 MG tablet Take 160 mg by mouth at bedtime.    Marland Kitchen omeprazole (PRILOSEC) 20 MG capsule Take 20 mg by mouth every morning.     Marland Kitchen oxyCODONE-acetaminophen (PERCOCET) 5-325 MG tablet Take 1-2 tablets by mouth every 6 (six) hours as needed for severe pain. Post-operatively 20 tablet 0  . senna-docusate (SENOKOT-S) 8.6-50 MG tablet Take 1 tablet by mouth 2 (two) times daily. While taking strong pain meds to prevent constipation 20 tablet 0  . simvastatin (ZOCOR) 40 MG tablet Take 40 mg by mouth at bedtime.     No current facility-administered medications for this visit.      Allergies:  Allergies  Allergen Reactions  . Emend [Fosaprepitant Dimeglumine] Cough    Reaction during infusion with cough, erythema of face and neck.      Past Medical History, Surgical history, Social history, and Family History were reviewed and updated.    Physical Exam:   Blood pressure (!) 134/92, pulse 93, temperature 97.9 F (36.6 C), temperature source Oral, resp. rate 17, height 5\' 8"  (1.727 m), weight 155 lb 3.2 oz (70.4 kg), SpO2 100 %.    ECOG: 0   General appearance: Comfortable appearing without any discomfort Head: Normocephalic without any trauma Oropharynx: Mucous membranes are moist  and pink without any thrush or ulcers. Eyes: Pupils are equal and round reactive to light. Lymph nodes: No cervical, supraclavicular, inguinal or axillary lymphadenopathy.   Heart:regular rate and rhythm.  S1 and S2 without leg edema. Lung: Clear without any rhonchi or wheezes.  No dullness to percussion. Abdomin: Soft, nontender, nondistended with good bowel sounds.  No hepatosplenomegaly. Musculoskeletal: No joint  deformity or effusion.  Full range of motion noted. Neurological: No deficits noted on motor, sensory and deep tendon reflex exam. Skin: No petechial rash or dryness.  Appeared moist.       Lab Results: Lab Results  Component Value Date   WBC 7.3 06/29/2018   HGB 10.9 (L) 06/29/2018   HCT 33.4 (L) 06/29/2018   MCV 89.8 06/29/2018   PLT 288 06/29/2018     Chemistry      Component Value Date/Time   NA 141 06/16/2018 0422   K 3.1 (L) 06/16/2018 0422   CL 108 06/16/2018 0422   CO2 26 06/16/2018 0422   BUN 13 06/16/2018 0422   CREATININE 1.16 06/16/2018 0422   CREATININE 1.38 (H) 04/20/2018 0957   CREATININE 0.95 08/01/2011 1140      Component Value Date/Time   CALCIUM 8.1 (L) 06/16/2018 0422   ALKPHOS 55 06/10/2018 1330   AST 24 06/10/2018 1330   AST 25 04/20/2018 0957   ALT 18 06/10/2018 1330   ALT 21 04/20/2018 0957   BILITOT 1.3 (H) 06/10/2018 1330   BILITOT 1.3 (H) 04/20/2018 0957        Impression and Plan:  77 year old man with:  1.   High-grade urothelial carcinoma of the bladder diagnosed in March 2019.  He was found to have initially T2N0 disease and subsequently treated with neoadjuvant chemotherapy followed by radical cystectomy.  The final pathology from his operation on 06/11/2018 was personally reviewed and discussed with the patient.  He has no evidence of invasive malignancy noted at this time.  He has carcinoma in situ as a residual tumor.  No lymph node involvement was noted.  Based on these findings, it appears that his disease will be low risk of recurrence moving forward.  No additional chemotherapy is indicated in this particular setting.  I recommended continued observation and surveillance and repeat imaging studies in 6 months.  He will likely have that done with Dr. Tresa Moore.  2.  IV access: Port-A-Cath will be flushed every 2 months and potentially removed in 6 months if no evidence of recurrent disease is noted.   3.  Renal function  surveillance: His creatinine continues to be stable after his surgery.  4.  Anemia: His hemoglobin is improving after his operation currently at 10.9.  No for any further intervention at this time.  5.  Prostate cancer: This was found incidentally with his cystoprostatectomy.  No additional therapy is needed.  6.  Follow-up: We will be in 6 months to follow his progress.  15  minutes was spent with the patient face-to-face today.  More than 50% of time was dedicated to reviewing the natural course of his disease, reviewing pathology results and discussing treatment options moving forward.   Zola Button, MD 9/17/20198:17 AM

## 2018-06-30 DIAGNOSIS — Z87891 Personal history of nicotine dependence: Secondary | ICD-10-CM | POA: Diagnosis not present

## 2018-06-30 DIAGNOSIS — E785 Hyperlipidemia, unspecified: Secondary | ICD-10-CM | POA: Diagnosis not present

## 2018-06-30 DIAGNOSIS — Z483 Aftercare following surgery for neoplasm: Secondary | ICD-10-CM | POA: Diagnosis not present

## 2018-06-30 DIAGNOSIS — Z960 Presence of urogenital implants: Secondary | ICD-10-CM | POA: Diagnosis not present

## 2018-06-30 DIAGNOSIS — Z436 Encounter for attention to other artificial openings of urinary tract: Secondary | ICD-10-CM | POA: Diagnosis not present

## 2018-06-30 DIAGNOSIS — N183 Chronic kidney disease, stage 3 (moderate): Secondary | ICD-10-CM | POA: Diagnosis not present

## 2018-06-30 DIAGNOSIS — C679 Malignant neoplasm of bladder, unspecified: Secondary | ICD-10-CM | POA: Diagnosis not present

## 2018-06-30 DIAGNOSIS — K219 Gastro-esophageal reflux disease without esophagitis: Secondary | ICD-10-CM | POA: Diagnosis not present

## 2018-07-01 DIAGNOSIS — R8271 Bacteriuria: Secondary | ICD-10-CM | POA: Diagnosis not present

## 2018-07-06 DIAGNOSIS — Z8551 Personal history of malignant neoplasm of bladder: Secondary | ICD-10-CM | POA: Diagnosis not present

## 2018-07-06 DIAGNOSIS — Z936 Other artificial openings of urinary tract status: Secondary | ICD-10-CM | POA: Diagnosis not present

## 2018-07-16 DIAGNOSIS — N183 Chronic kidney disease, stage 3 (moderate): Secondary | ICD-10-CM | POA: Diagnosis not present

## 2018-07-16 DIAGNOSIS — C670 Malignant neoplasm of trigone of bladder: Secondary | ICD-10-CM | POA: Diagnosis not present

## 2018-07-27 DIAGNOSIS — C670 Malignant neoplasm of trigone of bladder: Secondary | ICD-10-CM | POA: Diagnosis not present

## 2018-08-10 DIAGNOSIS — R531 Weakness: Secondary | ICD-10-CM | POA: Diagnosis not present

## 2018-08-10 DIAGNOSIS — E46 Unspecified protein-calorie malnutrition: Secondary | ICD-10-CM | POA: Diagnosis not present

## 2018-08-10 DIAGNOSIS — R5383 Other fatigue: Secondary | ICD-10-CM | POA: Diagnosis not present

## 2018-08-10 DIAGNOSIS — R110 Nausea: Secondary | ICD-10-CM | POA: Diagnosis not present

## 2018-08-24 ENCOUNTER — Inpatient Hospital Stay: Payer: Medicare Other | Attending: Oncology

## 2018-08-24 DIAGNOSIS — C671 Malignant neoplasm of dome of bladder: Secondary | ICD-10-CM

## 2018-08-24 DIAGNOSIS — Z452 Encounter for adjustment and management of vascular access device: Secondary | ICD-10-CM | POA: Insufficient documentation

## 2018-08-24 DIAGNOSIS — Z8546 Personal history of malignant neoplasm of prostate: Secondary | ICD-10-CM | POA: Diagnosis not present

## 2018-08-24 DIAGNOSIS — Z8551 Personal history of malignant neoplasm of bladder: Secondary | ICD-10-CM | POA: Insufficient documentation

## 2018-08-24 DIAGNOSIS — Z95828 Presence of other vascular implants and grafts: Secondary | ICD-10-CM

## 2018-08-24 MED ORDER — SODIUM CHLORIDE 0.9% FLUSH
10.0000 mL | Freq: Once | INTRAVENOUS | Status: AC
  Administered 2018-08-24: 10 mL
  Filled 2018-08-24: qty 10

## 2018-08-24 MED ORDER — HEPARIN SOD (PORK) LOCK FLUSH 100 UNIT/ML IV SOLN
500.0000 [IU] | Freq: Once | INTRAVENOUS | Status: AC
  Administered 2018-08-24: 500 [IU]
  Filled 2018-08-24: qty 5

## 2018-08-24 MED ORDER — HEPARIN SOD (PORK) LOCK FLUSH 100 UNIT/ML IV SOLN
250.0000 [IU] | Freq: Once | INTRAVENOUS | Status: DC
  Filled 2018-08-24: qty 5

## 2018-08-30 DIAGNOSIS — Z0000 Encounter for general adult medical examination without abnormal findings: Secondary | ICD-10-CM | POA: Diagnosis not present

## 2018-08-30 DIAGNOSIS — R5383 Other fatigue: Secondary | ICD-10-CM | POA: Diagnosis not present

## 2018-08-30 DIAGNOSIS — R531 Weakness: Secondary | ICD-10-CM | POA: Diagnosis not present

## 2018-08-30 DIAGNOSIS — Z23 Encounter for immunization: Secondary | ICD-10-CM | POA: Diagnosis not present

## 2018-09-21 DIAGNOSIS — Z712 Person consulting for explanation of examination or test findings: Secondary | ICD-10-CM | POA: Diagnosis not present

## 2018-09-21 DIAGNOSIS — E782 Mixed hyperlipidemia: Secondary | ICD-10-CM | POA: Diagnosis not present

## 2018-09-21 DIAGNOSIS — S76091D Other specified injury of muscle, fascia and tendon of right hip, subsequent encounter: Secondary | ICD-10-CM | POA: Diagnosis not present

## 2018-09-21 DIAGNOSIS — Z6824 Body mass index (BMI) 24.0-24.9, adult: Secondary | ICD-10-CM | POA: Diagnosis not present

## 2018-09-21 DIAGNOSIS — R7301 Impaired fasting glucose: Secondary | ICD-10-CM | POA: Diagnosis not present

## 2018-09-21 DIAGNOSIS — K219 Gastro-esophageal reflux disease without esophagitis: Secondary | ICD-10-CM | POA: Diagnosis not present

## 2018-09-24 DIAGNOSIS — E782 Mixed hyperlipidemia: Secondary | ICD-10-CM | POA: Diagnosis not present

## 2018-09-24 DIAGNOSIS — C679 Malignant neoplasm of bladder, unspecified: Secondary | ICD-10-CM | POA: Diagnosis not present

## 2018-09-24 DIAGNOSIS — N183 Chronic kidney disease, stage 3 (moderate): Secondary | ICD-10-CM | POA: Diagnosis not present

## 2018-09-24 DIAGNOSIS — E875 Hyperkalemia: Secondary | ICD-10-CM | POA: Diagnosis not present

## 2018-09-24 DIAGNOSIS — K219 Gastro-esophageal reflux disease without esophagitis: Secondary | ICD-10-CM | POA: Diagnosis not present

## 2018-10-08 DIAGNOSIS — Z712 Person consulting for explanation of examination or test findings: Secondary | ICD-10-CM | POA: Diagnosis not present

## 2018-10-08 DIAGNOSIS — C679 Malignant neoplasm of bladder, unspecified: Secondary | ICD-10-CM | POA: Diagnosis not present

## 2018-10-08 DIAGNOSIS — S76091D Other specified injury of muscle, fascia and tendon of right hip, subsequent encounter: Secondary | ICD-10-CM | POA: Diagnosis not present

## 2018-10-08 DIAGNOSIS — Z6824 Body mass index (BMI) 24.0-24.9, adult: Secondary | ICD-10-CM | POA: Diagnosis not present

## 2018-10-08 DIAGNOSIS — K219 Gastro-esophageal reflux disease without esophagitis: Secondary | ICD-10-CM | POA: Diagnosis not present

## 2018-10-22 DIAGNOSIS — Z8551 Personal history of malignant neoplasm of bladder: Secondary | ICD-10-CM | POA: Diagnosis not present

## 2018-10-22 DIAGNOSIS — Z936 Other artificial openings of urinary tract status: Secondary | ICD-10-CM | POA: Diagnosis not present

## 2018-10-26 ENCOUNTER — Inpatient Hospital Stay: Payer: Medicare Other | Attending: Oncology

## 2018-10-26 DIAGNOSIS — Z8546 Personal history of malignant neoplasm of prostate: Secondary | ICD-10-CM | POA: Insufficient documentation

## 2018-10-26 DIAGNOSIS — Z95828 Presence of other vascular implants and grafts: Secondary | ICD-10-CM

## 2018-10-26 DIAGNOSIS — Z452 Encounter for adjustment and management of vascular access device: Secondary | ICD-10-CM | POA: Insufficient documentation

## 2018-10-26 DIAGNOSIS — Z8551 Personal history of malignant neoplasm of bladder: Secondary | ICD-10-CM | POA: Diagnosis not present

## 2018-10-26 DIAGNOSIS — C671 Malignant neoplasm of dome of bladder: Secondary | ICD-10-CM

## 2018-10-26 DIAGNOSIS — D649 Anemia, unspecified: Secondary | ICD-10-CM | POA: Insufficient documentation

## 2018-10-26 MED ORDER — HEPARIN SOD (PORK) LOCK FLUSH 100 UNIT/ML IV SOLN
500.0000 [IU] | Freq: Once | INTRAVENOUS | Status: AC
  Administered 2018-10-26: 500 [IU]
  Filled 2018-10-26: qty 5

## 2018-10-26 MED ORDER — SODIUM CHLORIDE 0.9% FLUSH
10.0000 mL | Freq: Once | INTRAVENOUS | Status: AC
  Administered 2018-10-26: 10 mL
  Filled 2018-10-26: qty 10

## 2018-11-22 ENCOUNTER — Ambulatory Visit (HOSPITAL_COMMUNITY)
Admission: RE | Admit: 2018-11-22 | Discharge: 2018-11-22 | Disposition: A | Discharge status: Home / Self Care | Payer: Medicare Other | Source: Ambulatory Visit | Attending: Nurse Practitioner | Admitting: Nurse Practitioner

## 2018-11-22 ENCOUNTER — Other Ambulatory Visit (HOSPITAL_COMMUNITY): Payer: Self-pay | Admitting: Nurse Practitioner

## 2018-11-22 DIAGNOSIS — C670 Malignant neoplasm of trigone of bladder: Secondary | ICD-10-CM

## 2018-11-22 DIAGNOSIS — C61 Malignant neoplasm of prostate: Secondary | ICD-10-CM

## 2018-11-22 DIAGNOSIS — Z8551 Personal history of malignant neoplasm of bladder: Secondary | ICD-10-CM | POA: Diagnosis not present

## 2018-11-22 DIAGNOSIS — N132 Hydronephrosis with renal and ureteral calculous obstruction: Secondary | ICD-10-CM | POA: Diagnosis not present

## 2018-11-30 DIAGNOSIS — C670 Malignant neoplasm of trigone of bladder: Secondary | ICD-10-CM | POA: Diagnosis not present

## 2018-11-30 DIAGNOSIS — N183 Chronic kidney disease, stage 3 (moderate): Secondary | ICD-10-CM | POA: Diagnosis not present

## 2018-12-28 ENCOUNTER — Inpatient Hospital Stay: Payer: Medicare Other | Attending: Oncology

## 2018-12-28 ENCOUNTER — Telehealth: Payer: Self-pay | Admitting: Oncology

## 2018-12-28 ENCOUNTER — Inpatient Hospital Stay (HOSPITAL_BASED_OUTPATIENT_CLINIC_OR_DEPARTMENT_OTHER): Payer: Medicare Other | Admitting: Oncology

## 2018-12-28 ENCOUNTER — Inpatient Hospital Stay: Payer: Medicare Other

## 2018-12-28 ENCOUNTER — Other Ambulatory Visit: Payer: Self-pay

## 2018-12-28 VITALS — BP 145/89 | HR 83 | Temp 98.0°F | Resp 18 | Ht 68.0 in | Wt 159.0 lb

## 2018-12-28 DIAGNOSIS — Z9079 Acquired absence of other genital organ(s): Secondary | ICD-10-CM

## 2018-12-28 DIAGNOSIS — R944 Abnormal results of kidney function studies: Secondary | ICD-10-CM | POA: Diagnosis not present

## 2018-12-28 DIAGNOSIS — D6481 Anemia due to antineoplastic chemotherapy: Secondary | ICD-10-CM | POA: Diagnosis not present

## 2018-12-28 DIAGNOSIS — Z8551 Personal history of malignant neoplasm of bladder: Secondary | ICD-10-CM

## 2018-12-28 DIAGNOSIS — Z8546 Personal history of malignant neoplasm of prostate: Secondary | ICD-10-CM

## 2018-12-28 DIAGNOSIS — T451X5A Adverse effect of antineoplastic and immunosuppressive drugs, initial encounter: Secondary | ICD-10-CM | POA: Diagnosis not present

## 2018-12-28 DIAGNOSIS — Z95828 Presence of other vascular implants and grafts: Secondary | ICD-10-CM

## 2018-12-28 DIAGNOSIS — C671 Malignant neoplasm of dome of bladder: Secondary | ICD-10-CM

## 2018-12-28 DIAGNOSIS — E782 Mixed hyperlipidemia: Secondary | ICD-10-CM | POA: Diagnosis not present

## 2018-12-28 DIAGNOSIS — R7301 Impaired fasting glucose: Secondary | ICD-10-CM | POA: Diagnosis not present

## 2018-12-28 LAB — CBC WITH DIFFERENTIAL (CANCER CENTER ONLY)
Abs Immature Granulocytes: 0.04 10*3/uL (ref 0.00–0.07)
Basophils Absolute: 0 10*3/uL (ref 0.0–0.1)
Basophils Relative: 1 %
Eosinophils Absolute: 0.2 10*3/uL (ref 0.0–0.5)
Eosinophils Relative: 3 %
HCT: 39.3 % (ref 39.0–52.0)
Hemoglobin: 12 g/dL — ABNORMAL LOW (ref 13.0–17.0)
Immature Granulocytes: 1 %
LYMPHS ABS: 1.8 10*3/uL (ref 0.7–4.0)
Lymphocytes Relative: 22 %
MCH: 27.2 pg (ref 26.0–34.0)
MCHC: 30.5 g/dL (ref 30.0–36.0)
MCV: 89.1 fL (ref 80.0–100.0)
MONO ABS: 0.6 10*3/uL (ref 0.1–1.0)
MONOS PCT: 8 %
Neutro Abs: 5.4 10*3/uL (ref 1.7–7.7)
Neutrophils Relative %: 65 %
Platelet Count: 259 10*3/uL (ref 150–400)
RBC: 4.41 MIL/uL (ref 4.22–5.81)
RDW: 14.6 % (ref 11.5–15.5)
WBC Count: 8.1 10*3/uL (ref 4.0–10.5)
nRBC: 0 % (ref 0.0–0.2)

## 2018-12-28 LAB — CMP (CANCER CENTER ONLY)
ALT: 14 U/L (ref 0–44)
AST: 14 U/L — ABNORMAL LOW (ref 15–41)
Albumin: 3.8 g/dL (ref 3.5–5.0)
Alkaline Phosphatase: 114 U/L (ref 38–126)
Anion gap: 10 (ref 5–15)
BUN: 26 mg/dL — ABNORMAL HIGH (ref 8–23)
CO2: 22 mmol/L (ref 22–32)
CREATININE: 1.39 mg/dL — AB (ref 0.61–1.24)
Calcium: 9.4 mg/dL (ref 8.9–10.3)
Chloride: 108 mmol/L (ref 98–111)
GFR, Est AFR Am: 57 mL/min — ABNORMAL LOW (ref >60)
GFR, Estimated: 49 mL/min — ABNORMAL LOW (ref >60)
Glucose, Bld: 87 mg/dL (ref 70–99)
Potassium: 4.6 mmol/L (ref 3.5–5.1)
Sodium: 140 mmol/L (ref 135–145)
Total Bilirubin: 0.8 mg/dL (ref 0.3–1.2)
Total Protein: 7.7 g/dL (ref 6.5–8.1)

## 2018-12-28 MED ORDER — SODIUM CHLORIDE 0.9% FLUSH
10.0000 mL | Freq: Once | INTRAVENOUS | Status: DC
  Filled 2018-12-28: qty 10

## 2018-12-28 MED ORDER — HEPARIN SOD (PORK) LOCK FLUSH 100 UNIT/ML IV SOLN
500.0000 [IU] | Freq: Once | INTRAVENOUS | Status: DC
  Filled 2018-12-28: qty 5

## 2018-12-28 NOTE — Telephone Encounter (Signed)
Scheduled per los, declined printout  ° °

## 2018-12-28 NOTE — Progress Notes (Signed)
Hematology and Oncology Follow Up Visit  [name] [id number] 1941-09-11 78 y.o. 12/28/2018 1:42 PM [name], MDHall, Edwinna Areola, MD   Principle Diagnosis: 78 year old man with T2N0 high-grade urothelial carcinoma of the bladder cancer diagnosed in March 2019.  He was found to have carcinoma in situ postoperatively.  Prior Therapy:  He is status post TURBT on December 11, 2017.  The  pathology showed high-grade invasive urothelial carcinoma at the base of the bladder.   He is status post 4 cycles of neoadjuvant chemotherapy utilizing cisplatin and gemcitabine concluded on 03/24/2018.  He is status post cystoprostatectomy completed on June 11, 2018 with the final pathology showed carcinoma in situ only residual disease.  With 0 out of 18 lymph nodes involvement of malignancy.  He was also found to have Gleason score 3+4 = 7 prostate cancer.  Current therapy: Active surveillance.  Interim History: [name] returns today for a repeat evaluation.  Since the last visit, he reports no major changes in his health.  He continues to recover fully from his operation in September.  He has tolerated and adapted his urostomy without any issues.  He denies any recent hospitalization or illnesses.  He denies any fevers or chills or excessive fatigue.  He denies any issues related to his Port-A-Cath which remains in place.  Patient denied any alteration mental status, neuropathy, confusion or dizziness.  Denies any headaches or lethargy.  Denies any night sweats, weight loss or changes in appetite.  Denied orthopnea, dyspnea on exertion or chest discomfort.  Denies shortness of breath, difficulty breathing hemoptysis or cough.  Denies any abdominal distention, nausea, early satiety or dyspepsia.  Denies any hematuria, frequency, dysuria or nocturia.  Denies any skin irritation, dryness or rash.  Denies any ecchymosis or petechiae.  Denies any lymphadenopathy or clotting. Remaining review of system is  negative.         Medications: I have reviewed the patient's current medications.  Current Outpatient Medications  Medication Sig Dispense Refill  . fenofibrate 160 MG tablet Take 160 mg by mouth at bedtime.    Marland Kitchen omeprazole (PRILOSEC) 20 MG capsule Take 20 mg by mouth every morning.     Marland Kitchen oxyCODONE-acetaminophen (PERCOCET) 5-325 MG tablet Take 1-2 tablets by mouth every 6 (six) hours as needed for severe pain. Post-operatively 20 tablet 0  . senna-docusate (SENOKOT-S) 8.6-50 MG tablet Take 1 tablet by mouth 2 (two) times daily. While taking strong pain meds to prevent constipation 20 tablet 0  . simvastatin (ZOCOR) 40 MG tablet Take 40 mg by mouth at bedtime.     No current facility-administered medications for this visit.      Allergies:  Allergies  Allergen Reactions  . Emend [Fosaprepitant Dimeglumine] Cough    Reaction during infusion with cough, erythema of face and neck.      Past Medical History, Surgical history, Social history, and Family History were reviewed and updated.    Physical Exam:   Blood pressure (!) 145/89, pulse 83, temperature 98 F (36.7 C), temperature source Oral, resp. rate 18, height 5\' 8"  (1.727 m), weight 159 lb (72.1 kg), SpO2 100 %.    ECOG: 0   General appearance: Alert, awake without any distress. Head: Atraumatic without abnormalities Oropharynx: Without any thrush or ulcers. Eyes: No scleral icterus. Lymph nodes: No lymphadenopathy noted in the cervical, supraclavicular, or axillary nodes Heart:regular rate and rhythm, without any murmurs or gallops.   Lung: Clear to auscultation without any rhonchi, wheezes or  dullness to percussion. Abdomin: Soft, nontender without any shifting dullness or ascites. Musculoskeletal: No clubbing or cyanosis. Neurological: No motor or sensory deficits. Skin: No rashes or lesions.      Lab Results: Lab Results  Component Value Date   WBC 8.1 12/28/2018   HGB 12.0 (L) 12/28/2018   HCT  39.3 12/28/2018   MCV 89.1 12/28/2018   PLT 259 12/28/2018     Chemistry      Component Value Date/Time   NA 141 06/29/2018 0742   K 4.2 06/29/2018 0742   CL 105 06/29/2018 0742   CO2 26 06/29/2018 0742   BUN 24 (H) 06/29/2018 0742   CREATININE 1.36 (H) 06/29/2018 0742   CREATININE 0.95 08/01/2011 1140      Component Value Date/Time   CALCIUM 9.8 06/29/2018 0742   ALKPHOS 86 06/29/2018 0742   AST 15 06/29/2018 0742   ALT 14 06/29/2018 0742   BILITOT 0.9 06/29/2018 0742        Impression and Plan:  78 year old man with:  1.    Bladder cancer diagnosed in March 2019.  He was found to have a clinical stage T2N0 and subsequently has carcinoma in situ after neoadjuvant therapy and surgical resection.  The natural course of his disease and risk of relapse was discussed today.  He is over 6 months out from his operation without any evidence to suggest relapse.  He had CT scan and x-ray obtained in February 2020 which I personally reviewed and discussed with him.  His risk of relapse appears to be very low given his residual disease and very highly responsive tumor.  However I have recommended continued active surveillance as he is currently receiving.  His next imaging study scheduled for August 2020 under the care of Dr. Tresa Moore.  I have recommended continue with clinical surveillance and he will have a physical examination after his CT scans at that time.   2.  IV access: Risks and benefits of Port-A-Cath removal was discussed today.  And I have recommended removal of his Port-A-Cath and he is agreeable to proceed.   3.  Renal function surveillance: We will continue to monitor his creatinine after cisplatin chemotherapy and surgery.  4.  Anemia: Related to chemotherapy and surgery.  His hemoglobin is close to normal at this time.  5.  Prostate cancer: Incidental finding through surgery noted.  No additional treatment is required at this time.  6.  Follow-up: We will be in  October 2020 for repeat evaluation.  15  minutes was spent with the patient face-to-face today.  More than 50% of time was spent on reviewing disease status, reviewing imaging studies assessing the risk of relapse and answering questions regarding future plan of care.   Zola Button, MD 3/17/20201:42 PM

## 2019-01-04 DIAGNOSIS — R7301 Impaired fasting glucose: Secondary | ICD-10-CM | POA: Diagnosis not present

## 2019-01-04 DIAGNOSIS — E782 Mixed hyperlipidemia: Secondary | ICD-10-CM | POA: Diagnosis not present

## 2019-01-04 DIAGNOSIS — N183 Chronic kidney disease, stage 3 (moderate): Secondary | ICD-10-CM | POA: Diagnosis not present

## 2019-01-04 DIAGNOSIS — K219 Gastro-esophageal reflux disease without esophagitis: Secondary | ICD-10-CM | POA: Diagnosis not present

## 2019-01-04 DIAGNOSIS — E875 Hyperkalemia: Secondary | ICD-10-CM | POA: Diagnosis not present

## 2019-01-25 DIAGNOSIS — Z936 Other artificial openings of urinary tract status: Secondary | ICD-10-CM | POA: Diagnosis not present

## 2019-01-25 DIAGNOSIS — Z8551 Personal history of malignant neoplasm of bladder: Secondary | ICD-10-CM | POA: Diagnosis not present

## 2019-02-19 DIAGNOSIS — Z0000 Encounter for general adult medical examination without abnormal findings: Secondary | ICD-10-CM | POA: Diagnosis not present

## 2019-03-15 DIAGNOSIS — H9202 Otalgia, left ear: Secondary | ICD-10-CM | POA: Diagnosis not present

## 2019-03-15 DIAGNOSIS — H60392 Other infective otitis externa, left ear: Secondary | ICD-10-CM | POA: Diagnosis not present

## 2019-03-21 ENCOUNTER — Other Ambulatory Visit: Payer: Self-pay

## 2019-03-21 ENCOUNTER — Telehealth (HOSPITAL_COMMUNITY): Payer: Self-pay

## 2019-03-25 DIAGNOSIS — Z8551 Personal history of malignant neoplasm of bladder: Secondary | ICD-10-CM | POA: Diagnosis not present

## 2019-03-25 DIAGNOSIS — Z936 Other artificial openings of urinary tract status: Secondary | ICD-10-CM | POA: Diagnosis not present

## 2019-04-04 ENCOUNTER — Other Ambulatory Visit: Payer: Self-pay | Admitting: Radiology

## 2019-04-05 ENCOUNTER — Ambulatory Visit (HOSPITAL_COMMUNITY)
Admission: RE | Admit: 2019-04-05 | Discharge: 2019-04-05 | Disposition: A | Discharge status: Home / Self Care | Payer: Medicare Other | Source: Ambulatory Visit | Attending: Oncology | Admitting: Oncology

## 2019-04-05 ENCOUNTER — Encounter (HOSPITAL_COMMUNITY): Payer: Self-pay

## 2019-04-05 ENCOUNTER — Other Ambulatory Visit: Payer: Self-pay

## 2019-04-05 DIAGNOSIS — K219 Gastro-esophageal reflux disease without esophagitis: Secondary | ICD-10-CM | POA: Diagnosis not present

## 2019-04-05 DIAGNOSIS — Z9852 Vasectomy status: Secondary | ICD-10-CM | POA: Insufficient documentation

## 2019-04-05 DIAGNOSIS — Z87891 Personal history of nicotine dependence: Secondary | ICD-10-CM | POA: Diagnosis not present

## 2019-04-05 DIAGNOSIS — Z888 Allergy status to other drugs, medicaments and biological substances status: Secondary | ICD-10-CM | POA: Insufficient documentation

## 2019-04-05 DIAGNOSIS — C671 Malignant neoplasm of dome of bladder: Secondary | ICD-10-CM

## 2019-04-05 DIAGNOSIS — E7800 Pure hypercholesterolemia, unspecified: Secondary | ICD-10-CM | POA: Diagnosis not present

## 2019-04-05 DIAGNOSIS — Z452 Encounter for adjustment and management of vascular access device: Secondary | ICD-10-CM | POA: Insufficient documentation

## 2019-04-05 DIAGNOSIS — N400 Enlarged prostate without lower urinary tract symptoms: Secondary | ICD-10-CM | POA: Insufficient documentation

## 2019-04-05 DIAGNOSIS — C679 Malignant neoplasm of bladder, unspecified: Secondary | ICD-10-CM | POA: Diagnosis not present

## 2019-04-05 DIAGNOSIS — Z8551 Personal history of malignant neoplasm of bladder: Secondary | ICD-10-CM | POA: Diagnosis not present

## 2019-04-05 DIAGNOSIS — Z9079 Acquired absence of other genital organ(s): Secondary | ICD-10-CM | POA: Diagnosis not present

## 2019-04-05 HISTORY — PX: IR REMOVAL TUN ACCESS W/ PORT W/O FL MOD SED: IMG2290

## 2019-04-05 LAB — PROTIME-INR
INR: 1 (ref 0.8–1.2)
Prothrombin Time: 13.2 seconds (ref 11.4–15.2)

## 2019-04-05 LAB — CBC
HCT: 42.1 % (ref 39.0–52.0)
Hemoglobin: 13.1 g/dL (ref 13.0–17.0)
MCH: 28.4 pg (ref 26.0–34.0)
MCHC: 31.1 g/dL (ref 30.0–36.0)
MCV: 91.1 fL (ref 80.0–100.0)
Platelets: 165 10*3/uL (ref 150–400)
RBC: 4.62 MIL/uL (ref 4.22–5.81)
RDW: 14.5 % (ref 11.5–15.5)
WBC: 7.4 10*3/uL (ref 4.0–10.5)
nRBC: 0 % (ref 0.0–0.2)

## 2019-04-05 MED ORDER — FENTANYL CITRATE (PF) 100 MCG/2ML IJ SOLN
INTRAMUSCULAR | Status: AC
  Filled 2019-04-05: qty 2

## 2019-04-05 MED ORDER — LIDOCAINE-EPINEPHRINE (PF) 2 %-1:200000 IJ SOLN
INTRAMUSCULAR | Status: AC | PRN
  Administered 2019-04-05: 5 mL

## 2019-04-05 MED ORDER — MIDAZOLAM HCL 2 MG/2ML IJ SOLN
INTRAMUSCULAR | Status: AC | PRN
  Administered 2019-04-05: 1 mg via INTRAVENOUS

## 2019-04-05 MED ORDER — MIDAZOLAM HCL 2 MG/2ML IJ SOLN
INTRAMUSCULAR | Status: AC
  Filled 2019-04-05: qty 2

## 2019-04-05 MED ORDER — FENTANYL CITRATE (PF) 100 MCG/2ML IJ SOLN
INTRAMUSCULAR | Status: AC | PRN
  Administered 2019-04-05: 50 ug via INTRAVENOUS

## 2019-04-05 MED ORDER — LIDOCAINE-EPINEPHRINE (PF) 2 %-1:200000 IJ SOLN
INTRAMUSCULAR | Status: AC
  Filled 2019-04-05: qty 20

## 2019-04-05 MED ORDER — CEFAZOLIN SODIUM-DEXTROSE 2-4 GM/100ML-% IV SOLN
2.0000 g | Freq: Once | INTRAVENOUS | Status: AC
  Administered 2019-04-05: 2 g via INTRAVENOUS

## 2019-04-05 MED ORDER — CEFAZOLIN SODIUM-DEXTROSE 2-4 GM/100ML-% IV SOLN
INTRAVENOUS | Status: AC
  Administered 2019-04-05: 2 g via INTRAVENOUS
  Filled 2019-04-05: qty 100

## 2019-04-05 MED ORDER — SODIUM CHLORIDE 0.9 % IV SOLN
INTRAVENOUS | Status: DC
  Administered 2019-04-05: 13:00:00 via INTRAVENOUS

## 2019-04-05 NOTE — Discharge Instructions (Signed)
Implanted Port Removal, Care After °This sheet gives you information about how to care for yourself after your procedure. Your health care provider may also give you more specific instructions. If you have problems or questions, contact your health care provider. °What can I expect after the procedure? °After the procedure, it is common to have: °· Soreness or pain near your incision. °· Some swelling or bruising near your incision. °Follow these instructions at home: °Medicines °· Take over-the-counter and prescription medicines only as told by your health care provider. °· If you were prescribed an antibiotic medicine, take it as told by your health care provider. Do not stop taking the antibiotic even if you start to feel better. °Bathing °· Do not take baths, swim, or use a hot tub until your health care provider approves. Ask your health care provider if you can take showers. You may only be allowed to take sponge baths. °Incision care ° °· Follow instructions from your health care provider about how to take care of your incision. Make sure you: °? Wash your hands with soap and water before you change your bandage (dressing). If soap and water are not available, use hand sanitizer. °? Change your dressing as told by your health care provider. °? Keep your dressing dry. °? Leave stitches (sutures), skin glue, or adhesive strips in place. These skin closures may need to stay in place for 2 weeks or longer. If adhesive strip edges start to loosen and curl up, you may trim the loose edges. Do not remove adhesive strips completely unless your health care provider tells you to do that. °· Check your incision area every day for signs of infection. Check for: °? More redness, swelling, or pain. °? More fluid or blood. °? Warmth. °? Pus or a bad smell. °Driving ° °· Do not drive for 24 hours if you were given a medicine to help you relax (sedative) during your procedure. °· If you did not receive a sedative, ask your  health care provider when it is safe to drive. °Activity °· Return to your normal activities as told by your health care provider. Ask your health care provider what activities are safe for you. °· Do not lift anything that is heavier than 10 lb (4.5 kg), or the limit that you are told, until your health care provider says that it is safe. °· Do not do activities that involve lifting your arms over your head. °General instructions °· Do not use any products that contain nicotine or tobacco, such as cigarettes and e-cigarettes. These can delay healing. If you need help quitting, ask your health care provider. °· Keep all follow-up visits as told by your health care provider. This is important. °Contact a health care provider if: °· You have more redness, swelling, or pain around your incision. °· You have more fluid or blood coming from your incision. °· Your incision feels warm to the touch. °· You have pus or a bad smell coming from your incision. °· You have pain that is not relieved by your pain medicine. °Get help right away if you have: °· A fever or chills. °· Chest pain. °· Difficulty breathing. °Summary °· After the procedure, it is common to have pain, soreness, swelling, or bruising near your incision. °· If you were prescribed an antibiotic medicine, take it as told by your health care provider. Do not stop taking the antibiotic even if you start to feel better. °· Do not drive for 24 hours   if you were given a sedative during your procedure. °· Return to your normal activities as told by your health care provider. Ask your health care provider what activities are safe for you. °This information is not intended to replace advice given to you by your health care provider. Make sure you discuss any questions you have with your health care provider. °Document Released: 09/10/2015 Document Revised: 11/12/2017 Document Reviewed: 11/12/2017 °Elsevier Interactive Patient Education © 2019 [employer] ° ° °Moderate Conscious Sedation, Adult, Care After °These instructions provide you with information about caring for yourself after your procedure. Your health care provider may also give you more specific instructions. Your treatment has been planned according to current medical practices, but problems sometimes occur. Call your health care provider if you have any problems or questions after your procedure. °What can I expect after the procedure? °After your procedure, it is common: °· To feel sleepy for several hours. °· To feel clumsy and have poor balance for several hours. °· To have poor judgment for several hours. °· To vomit if you eat too soon. °Follow these instructions at home: °For at least 24 hours after the procedure: ° °· Do not: °? Participate in activities where you could fall or become injured. °? Drive. °? Use heavy machinery. °? Drink alcohol. °? Take sleeping pills or medicines that cause drowsiness. °? Make important decisions or sign legal documents. °? Take care of children on your own. °· Rest. °Eating and drinking °· Follow the diet recommended by your health care provider. °· If you vomit: °? Drink water, juice, or soup when you can drink without vomiting. °? Make sure you have little or no nausea before eating solid foods. °General instructions °· Have a responsible adult stay with you until you are awake and alert. °· Take over-the-counter and prescription medicines only as told by your health care provider. °· If you smoke, do not smoke without supervision. °· Keep all follow-up visits as told by your health care provider. This is important. °Contact a health care provider if: °· You keep feeling nauseous or you keep vomiting. °· You feel light-headed. °· You develop a rash. °· You have a fever. °Get help right away if: °· You have trouble breathing. °This information is not intended to replace advice given to you by your health care provider. Make sure you discuss any questions  you have with your health care provider. °Document Released: 07/20/2013 Document Revised: 03/03/2016 Document Reviewed: 01/19/2016 °Elsevier Interactive Patient Education © 2019 [employer] ° °

## 2019-04-05 NOTE — Procedures (Signed)
Interventional Radiology Procedure Note  Procedure: Successful explantation of right chest portacath.  Complications: None  Estimated Blood Loss: None  Recommendations: - DC home  Signed,  Criselda Peaches, MD

## 2019-04-05 NOTE — Progress Notes (Signed)
Discharge teaching done with wife at car [id number]

## 2019-04-05 NOTE — H&P (Signed)
Chief Complaint: Patient was seen in consultation today for bladder cancer  Referring Physician(s): Wyatt Portela  Supervising Physician: Jacqulynn Cadet  Patient Status: [facility] - Out-pt  History of Present Illness: [name] is a 78 y.o. male with past medical history of bladder cancer s/p neoadjuvant therapy via Port-A-Cath placed 12/28/17 by Dr. Kathlene Cote.  Patient has completed therapy without signs of disease recurrence.  He presents to [facility] today for Port-A-Cath removal.   He has been NPO.  He does not take blood thinners.  He denies fever, chills, abdominal pain, nausea, vomiting, cough, shortness of breath.   Past Medical History:  Diagnosis Date  . Bladder cancer [facility]) dx'd 12/2017   urologist-  dr Tresa Moore  . BPH (benign prostatic hyperplasia)   . Full dentures   . GERD (gastroesophageal reflux disease)   . Hiatal hernia   . History of bladder stone   . History of kidney stones    per pt hx passed stones spontenaously  . Hypercholesterolemia   . Wears glasses   . Wears hearing aid in both ears     Past Surgical History:  Procedure Laterality Date  . COLONOSCOPY WITH ESOPHAGOGASTRODUODENOSCOPY (EGD)  04-08-2012  dr [name]  . CYSTO/  BLADDER STONE EXTRACTIONS  1998 approx.  . CYSTOSCOPY WITH INJECTION N/A 06/11/2018   Procedure: CYSTOSCOPY WITH INJECTION;  Surgeon: Alexis Frock, MD;  Location: [facility];  Service: Urology;  Laterality: N/A;  . IR FLUORO GUIDE PORT INSERTION RIGHT  01/07/2018  . IR US GUIDE VASC ACCESS RIGHT  01/07/2018  . ROBOT ASSISTED LAPAROSCOPIC RADICAL PROSTATECTOMY N/A 06/11/2018   Procedure: XI ROBOT ASSISTED LAPAROSCOPIC RADICAL CYSTOPROSTATECTOMY BILATERAL PELVIC LYMPHADENECTOMY,NO ORTHOTOPIC NEOBLADDER - ILEAL;  Surgeon: Alexis Frock, MD;  Location: [facility];  Service: Urology;  Laterality: N/A;  . TRANSURETHRAL RESECTION OF BLADDER TUMOR N/A 12/11/2017   Procedure: CYSTOSCOPY WITH BILATERAL RETROGRADE PYELOGRAM, TRANSURETHRAL  RESECTION OF BLADDER TUMOR (TURBT);  Surgeon: Alexis Frock, MD;  Location: [facility];  Service: Urology;  Laterality: N/A;  . UMBILICAL HERNIA REPAIR  2008  . VASECTOMY  1980s    Allergies: Emend [fosaprepitant dimeglumine]  Medications: Prior to Admission medications   Medication Sig Start Date End Date Taking? Authorizing Provider  fenofibrate 160 MG tablet Take 160 mg by mouth at bedtime.   Yes [provider]  omeprazole (PRILOSEC) 20 MG capsule Take 20 mg by mouth every morning.  03/26/12  Yes Andria Meuse, NP  simvastatin (ZOCOR) 40 MG tablet Take 40 mg by mouth at bedtime.   Yes [provider]  oxyCODONE-acetaminophen (PERCOCET) 5-325 MG tablet Take 1-2 tablets by mouth every 6 (six) hours as needed for severe pain. Post-operatively 06/16/18 06/16/19  Alexis Frock, MD  senna-docusate (SENOKOT-S) 8.6-50 MG tablet Take 1 tablet by mouth 2 (two) times daily. While taking strong pain meds to prevent constipation 06/16/18   Alexis Frock, MD     Family History  Problem Relation Age of Onset  . Colon cancer Paternal Grandfather     Social History   Socioeconomic History  . Marital status: Married    Spouse name: Not on file  . Number of children: 2  . Years of education: Not on file  . Highest education level: Not on file  Occupational History  . Occupation: retired, [employer]    Employer: [employer]  Social Needs  . Financial resource strain: Not hard at all  . Food insecurity    Worry: Never true  Inability: Never true  . Transportation needs    Medical: No    Non-medical: No  Tobacco Use  . Smoking status: Former Smoker    Packs/day: 0.50    Years: 20.00    Pack years: 10.00    Types: Cigarettes    Quit date: 03/26/1998    Years since quitting: 21.0  . Smokeless tobacco: Never Used  Substance and Sexual Activity  . Alcohol use: Yes    Comment: occasional wine, beer  . Drug use: No  . Sexual activity:  Not on file  Lifestyle  . Physical activity    Days per week: 3 days    Minutes per session: 30 min  . Stress: Not at all  Relationships  . Social connections    Talks on phone: More than three times a week    Gets together: More than three times a week    Attends religious service: More than 4 times per year    Active member of club or organization: Yes    Attends meetings of clubs or organizations: More than 4 times per year    Relationship status: Married  Other Topics Concern  . Not on file  Social History Narrative   Lives w/ wife     Review of Systems: A 12 point ROS discussed and pertinent positives are indicated in the HPI above.  All other systems are negative.  Review of Systems  Constitutional: Negative for fatigue and fever.  Respiratory: Negative for cough and shortness of breath.   Cardiovascular: Negative for chest pain.  Gastrointestinal: Negative for abdominal pain, nausea and vomiting.  Genitourinary: Negative for dysuria.  Musculoskeletal: Negative for back pain.  Psychiatric/Behavioral: Negative for behavioral problems and confusion.    Vital Signs: BP (!) 155/90   Pulse 78   Temp 98.3 F (36.8 C) (Oral)   Resp 18   SpO2 100%   Physical Exam Vitals signs and nursing note reviewed.  Constitutional:      Appearance: Normal appearance.  HENT:     Mouth/Throat:     Mouth: Mucous membranes are moist.     Pharynx: Oropharynx is clear.  Neck:     Musculoskeletal: Normal range of motion.     Comments: Port-A-Cath in place on right chest Cardiovascular:     Rate and Rhythm: Normal rate and regular rhythm.  Pulmonary:     Effort: Pulmonary effort is normal. No respiratory distress.     Breath sounds: Normal breath sounds.  Skin:    General: Skin is warm and dry.  Neurological:     General: No focal deficit present.     Mental Status: He is alert and oriented to person, place, and time.  Psychiatric:        Mood and Affect: Mood normal.         Behavior: Behavior normal.        Thought Content: Thought content normal.        Judgment: Judgment normal.      MD Evaluation Airway: WNL Heart: WNL Abdomen: WNL Chest/ Lungs: WNL ASA  Classification: 3   Imaging: No results found.  Labs:  CBC: Recent Labs    06/16/18 0422 06/29/18 0742 12/28/18 1243 04/05/19 1310  WBC 6.3 7.3 8.1 7.4  HGB 10.3* 10.9* 12.0* 13.1  HCT 30.8* 33.4* 39.3 42.1  PLT 186 288 259 165    COAGS: Recent Labs    04/05/19 1310  INR 1.0    BMP: Recent Labs  06/15/18 0317 06/16/18 0422 06/29/18 0742 12/28/18 1243  NA 143 141 141 140  K 3.8 3.1* 4.2 4.6  CL 109 108 105 108  CO2 26 26 26 22   GLUCOSE 107* 155* 103* 87  BUN 14 13 24* 26*  CALCIUM 8.4* 8.1* 9.8 9.4  CREATININE 1.35* 1.16 1.36* 1.39*  GFRNONAA 50* 60* 49* 49*  GFRAA 58* >60 57* 57*    LIVER FUNCTION TESTS: Recent Labs    04/20/18 0957 06/10/18 1330 06/29/18 0742 12/28/18 1243  BILITOT 1.3* 1.3* 0.9 0.8  AST 25 24 15  14*  ALT 21 18 14 14   ALKPHOS 65 55 86 114  PROT 7.0 7.6 7.0 7.7  ALBUMIN 4.5 4.6 3.3* 3.8    TUMOR MARKERS: No results for input(s): AFPTM, CEA, CA199, CHROMGRNA in the last 8760 hours.  Assessment and Plan: Patient with past medical history of bladder cancer presents at the completion of chemotherapy for Port-A-Cath removal.  Patient presents today in their usual state of health.  He has been NPO and is not currently on blood thinners.   Risks and benefits of image guided port-a-catheter removal was discussed with the patient including, but not limited to bleeding, infection.  All of the patient's questions were answered, patient is agreeable to proceed. Consent signed and in chart.   Thank you for this interesting consult.  I greatly enjoyed meeting [name] and look forward to participating in their care.  A copy of this report was sent to the requesting provider on this date.  Electronically Signed: Docia Barrier, PA 04/05/2019, 1:52 PM   I spent a total of    25 Minutes in face to face in clinical consultation, greater than 50% of which was counseling/coordinating care for bladder cancer.

## 2019-04-13 DIAGNOSIS — D649 Anemia, unspecified: Secondary | ICD-10-CM | POA: Diagnosis not present

## 2019-04-13 DIAGNOSIS — R112 Nausea with vomiting, unspecified: Secondary | ICD-10-CM | POA: Diagnosis not present

## 2019-04-13 DIAGNOSIS — C679 Malignant neoplasm of bladder, unspecified: Secondary | ICD-10-CM | POA: Diagnosis not present

## 2019-04-13 DIAGNOSIS — R509 Fever, unspecified: Secondary | ICD-10-CM | POA: Diagnosis not present

## 2019-04-13 DIAGNOSIS — E782 Mixed hyperlipidemia: Secondary | ICD-10-CM | POA: Diagnosis not present

## 2019-05-24 ENCOUNTER — Other Ambulatory Visit (HOSPITAL_COMMUNITY): Payer: Self-pay | Admitting: Urology

## 2019-05-24 ENCOUNTER — Ambulatory Visit (HOSPITAL_COMMUNITY)
Admission: RE | Admit: 2019-05-24 | Discharge: 2019-05-24 | Disposition: A | Discharge status: Home / Self Care | Payer: Medicare Other | Source: Ambulatory Visit | Attending: Urology | Admitting: Urology

## 2019-05-24 ENCOUNTER — Other Ambulatory Visit: Payer: Self-pay

## 2019-05-24 DIAGNOSIS — C670 Malignant neoplasm of trigone of bladder: Secondary | ICD-10-CM | POA: Diagnosis not present

## 2019-05-24 DIAGNOSIS — C61 Malignant neoplasm of prostate: Secondary | ICD-10-CM | POA: Insufficient documentation

## 2019-05-24 DIAGNOSIS — Z85528 Personal history of other malignant neoplasm of kidney: Secondary | ICD-10-CM | POA: Diagnosis not present

## 2019-05-26 DIAGNOSIS — C670 Malignant neoplasm of trigone of bladder: Secondary | ICD-10-CM | POA: Diagnosis not present

## 2019-05-26 DIAGNOSIS — N130 Hydronephrosis with ureteropelvic junction obstruction: Secondary | ICD-10-CM | POA: Diagnosis not present

## 2019-05-26 DIAGNOSIS — Z936 Other artificial openings of urinary tract status: Secondary | ICD-10-CM | POA: Diagnosis not present

## 2019-06-07 DIAGNOSIS — Z936 Other artificial openings of urinary tract status: Secondary | ICD-10-CM | POA: Diagnosis not present

## 2019-06-07 DIAGNOSIS — Z8551 Personal history of malignant neoplasm of bladder: Secondary | ICD-10-CM | POA: Diagnosis not present

## 2019-06-27 ENCOUNTER — Ambulatory Visit (INDEPENDENT_AMBULATORY_CARE_PROVIDER_SITE_OTHER): Payer: Medicare Other | Admitting: Otolaryngology

## 2019-06-27 DIAGNOSIS — H903 Sensorineural hearing loss, bilateral: Secondary | ICD-10-CM

## 2019-07-06 DIAGNOSIS — E782 Mixed hyperlipidemia: Secondary | ICD-10-CM | POA: Diagnosis not present

## 2019-07-06 DIAGNOSIS — D649 Anemia, unspecified: Secondary | ICD-10-CM | POA: Diagnosis not present

## 2019-07-06 DIAGNOSIS — R7301 Impaired fasting glucose: Secondary | ICD-10-CM | POA: Diagnosis not present

## 2019-07-13 DIAGNOSIS — E875 Hyperkalemia: Secondary | ICD-10-CM | POA: Diagnosis not present

## 2019-07-13 DIAGNOSIS — E782 Mixed hyperlipidemia: Secondary | ICD-10-CM | POA: Diagnosis not present

## 2019-07-13 DIAGNOSIS — K219 Gastro-esophageal reflux disease without esophagitis: Secondary | ICD-10-CM | POA: Diagnosis not present

## 2019-07-13 DIAGNOSIS — C679 Malignant neoplasm of bladder, unspecified: Secondary | ICD-10-CM | POA: Diagnosis not present

## 2019-07-13 DIAGNOSIS — N183 Chronic kidney disease, stage 3 (moderate): Secondary | ICD-10-CM | POA: Diagnosis not present

## 2019-07-14 DIAGNOSIS — Z936 Other artificial openings of urinary tract status: Secondary | ICD-10-CM | POA: Diagnosis not present

## 2019-07-14 DIAGNOSIS — Z8551 Personal history of malignant neoplasm of bladder: Secondary | ICD-10-CM | POA: Diagnosis not present

## 2019-08-02 ENCOUNTER — Inpatient Hospital Stay: Payer: Medicare Other | Attending: Oncology | Admitting: Oncology

## 2019-08-02 ENCOUNTER — Inpatient Hospital Stay: Payer: Medicare Other

## 2019-08-02 ENCOUNTER — Other Ambulatory Visit: Payer: Self-pay

## 2019-08-02 VITALS — BP 155/95 | HR 77 | Temp 98.3°F | Resp 16 | Ht 68.0 in | Wt 166.6 lb

## 2019-08-02 DIAGNOSIS — Z8546 Personal history of malignant neoplasm of prostate: Secondary | ICD-10-CM | POA: Diagnosis not present

## 2019-08-02 DIAGNOSIS — C671 Malignant neoplasm of dome of bladder: Secondary | ICD-10-CM

## 2019-08-02 DIAGNOSIS — Z9079 Acquired absence of other genital organ(s): Secondary | ICD-10-CM | POA: Insufficient documentation

## 2019-08-02 DIAGNOSIS — Z9221 Personal history of antineoplastic chemotherapy: Secondary | ICD-10-CM | POA: Diagnosis not present

## 2019-08-02 DIAGNOSIS — Z8551 Personal history of malignant neoplasm of bladder: Secondary | ICD-10-CM | POA: Insufficient documentation

## 2019-08-02 LAB — CBC WITH DIFFERENTIAL (CANCER CENTER ONLY)
Abs Immature Granulocytes: 0.03 10*3/uL (ref 0.00–0.07)
Basophils Absolute: 0 10*3/uL (ref 0.0–0.1)
Basophils Relative: 0 %
Eosinophils Absolute: 0.2 10*3/uL (ref 0.0–0.5)
Eosinophils Relative: 2 %
HCT: 43.6 % (ref 39.0–52.0)
Hemoglobin: 14.4 g/dL (ref 13.0–17.0)
Immature Granulocytes: 0 %
Lymphocytes Relative: 22 %
Lymphs Abs: 1.5 10*3/uL (ref 0.7–4.0)
MCH: 30.2 pg (ref 26.0–34.0)
MCHC: 33 g/dL (ref 30.0–36.0)
MCV: 91.4 fL (ref 80.0–100.0)
Monocytes Absolute: 0.6 10*3/uL (ref 0.1–1.0)
Monocytes Relative: 8 %
Neutro Abs: 4.8 10*3/uL (ref 1.7–7.7)
Neutrophils Relative %: 68 %
Platelet Count: 158 10*3/uL (ref 150–400)
RBC: 4.77 MIL/uL (ref 4.22–5.81)
RDW: 13.6 % (ref 11.5–15.5)
WBC Count: 7.2 10*3/uL (ref 4.0–10.5)
nRBC: 0 % (ref 0.0–0.2)

## 2019-08-02 LAB — CMP (CANCER CENTER ONLY)
ALT: 18 U/L (ref 0–44)
AST: 24 U/L (ref 15–41)
Albumin: 4.4 g/dL (ref 3.5–5.0)
Alkaline Phosphatase: 92 U/L (ref 38–126)
Anion gap: 11 (ref 5–15)
BUN: 27 mg/dL — ABNORMAL HIGH (ref 8–23)
CO2: 24 mmol/L (ref 22–32)
Calcium: 9.7 mg/dL (ref 8.9–10.3)
Chloride: 104 mmol/L (ref 98–111)
Creatinine: 1.58 mg/dL — ABNORMAL HIGH (ref 0.61–1.24)
GFR, Est AFR Am: 49 mL/min — ABNORMAL LOW (ref >60)
GFR, Estimated: 42 mL/min — ABNORMAL LOW (ref >60)
Glucose, Bld: 104 mg/dL — ABNORMAL HIGH (ref 70–99)
Potassium: 4.2 mmol/L (ref 3.5–5.1)
Sodium: 139 mmol/L (ref 135–145)
Total Bilirubin: 2.2 mg/dL — ABNORMAL HIGH (ref 0.3–1.2)
Total Protein: 7.7 g/dL (ref 6.5–8.1)

## 2019-08-02 NOTE — Progress Notes (Signed)
Hematology and Oncology Follow Up Visit  PAZ BOULAIS [id number] 1941-04-06 78 y.o. 08/02/2019 9:41 AM [name], MDHall, Edwinna Areola, MD   Principle Diagnosis: 78 year old man with bladder cancer diagnosed in March 2019.  He was found to have T2N0 high-grade urothelial carcinoma.   Prior Therapy:  He is status post TURBT on December 11, 2017.  The  pathology showed high-grade invasive urothelial carcinoma at the base of the bladder.   He is status post 4 cycles of neoadjuvant chemotherapy utilizing cisplatin and gemcitabine concluded on 03/24/2018.  He is status post cystoprostatectomy completed on June 11, 2018 with the final pathology showed carcinoma in situ only residual disease.  With 0 out of 18 lymph nodes involvement of malignancy.  He was also found to have Gleason score 3+4 = 7 prostate cancer.  Current therapy: Active surveillance.  Interim History: [name] is here for a follow-up.  Since the last visit, he continues to feel well without any residual complications to chemotherapy.  He does report left-sided hearing loss but also has a lot of a congestion and fluid accumulation which could be unrelated to cisplatin.  He continues to be active and continues to attend activities of daily living.  He denies any decline in his performance status or quality of life.  He denied headaches, blurry vision, syncope or seizures.  Denies any fevers, chills or sweats.  Denied chest pain, palpitation, orthopnea or leg edema.  Denied cough, wheezing or hemoptysis.  Denied nausea, vomiting or abdominal pain.  Denies any constipation or diarrhea.  Denies any frequency urgency or hesitancy.  Denies any arthralgias or myalgias.  Denies any skin rashes or lesions.  Denies any bleeding or clotting tendency.  Denies any easy bruising.  Denies any hair or nail changes.  Denies any anxiety or depression.  Remaining review of system is negative.            Medications: Without any changes on  review. Current Outpatient Medications  Medication Sig Dispense Refill  . fenofibrate 160 MG tablet Take 160 mg by mouth at bedtime.    Marland Kitchen omeprazole (PRILOSEC) 20 MG capsule Take 20 mg by mouth every morning.     . senna-docusate (SENOKOT-S) 8.6-50 MG tablet Take 1 tablet by mouth 2 (two) times daily. While taking strong pain meds to prevent constipation 20 tablet 0  . simvastatin (ZOCOR) 40 MG tablet Take 40 mg by mouth at bedtime.     No current facility-administered medications for this visit.      Allergies:  Allergies  Allergen Reactions  . Emend [Fosaprepitant Dimeglumine] Cough    Reaction during infusion with cough, erythema of face and neck.      Past Medical History, Surgical history, Social history, and Family History updated without any changes.    Physical Exam:      ECOG: 0    General appearance: Comfortable appearing without any discomfort Head: Normocephalic without any trauma Oropharynx: Mucous membranes are moist and pink without any thrush or ulcers. Eyes: Pupils are equal and round reactive to light. Lymph nodes: No cervical, supraclavicular, inguinal or axillary lymphadenopathy.   Heart:regular rate and rhythm.  S1 and S2 without leg edema. Lung: Clear without any rhonchi or wheezes.  No dullness to percussion. Abdomin: Soft, nontender, nondistended with good bowel sounds.  No hepatosplenomegaly. Musculoskeletal: No joint deformity or effusion.  Full range of motion noted. Neurological: No deficits noted on motor, sensory and deep tendon reflex exam. Skin: No petechial rash or  dryness.  Appeared moist.        Lab Results: Lab Results  Component Value Date   WBC 7.2 08/02/2019   HGB 14.4 08/02/2019   HCT 43.6 08/02/2019   MCV 91.4 08/02/2019   PLT 158 08/02/2019     Chemistry      Component Value Date/Time   NA 140 12/28/2018 1243   K 4.6 12/28/2018 1243   CL 108 12/28/2018 1243   CO2 22 12/28/2018 1243   BUN 26 (H) 12/28/2018 1243    CREATININE 1.39 (H) 12/28/2018 1243   CREATININE 0.95 08/01/2011 1140      Component Value Date/Time   CALCIUM 9.4 12/28/2018 1243   ALKPHOS 114 12/28/2018 1243   AST 14 (L) 12/28/2018 1243   ALT 14 12/28/2018 1243   BILITOT 0.8 12/28/2018 1243        Impression and Plan:  77 year old man with:  1.  T2N0 bladder cancer diagnosed in March 2019.  He remains in remission at this time without any evidence of relapsed disease.  He continues to be on active surveillance since his surgery.  Imaging studies obtained in August 2020 and in the care of Dr. Tresa Moore showed no disease relapse.  The natural course of this disease as well as risk of relapse was assessed.  At this time he still has a low risk of relapse but active surveillance is warranted.  He continues to follow with Dr. Tresa Moore regarding this with repeat imaging studies every 6 months.   2.  IV access: Port-A-Cath removed without any complications.   3.  Renal function surveillance: No residual complications related to cisplatin with a stable kidney function.  4.  Anemia: Resolved at this time.  His anemia was related to chemotherapy and malignancy.  5.  Prostate cancer: Status post prostatectomy as a part of his bladder surgery.  No evidence of relapse at this time.  6.  Follow-up: I will be happy to see him in the future as needed.  He will continue his active surveillance with Dr. Tresa Moore.  15  minutes was spent with the patient face-to-face today.  More than 50% of time was dedicated to discussing the natural course of this disease, assessing risk of relapse as well as coordinating future plan of care.   Zola Button, MD 10/20/20209:41 AM

## 2019-08-03 ENCOUNTER — Telehealth: Payer: Self-pay | Admitting: Oncology

## 2019-08-03 NOTE — Telephone Encounter (Signed)
No los per 10/20

## 2019-09-21 DIAGNOSIS — Z936 Other artificial openings of urinary tract status: Secondary | ICD-10-CM | POA: Diagnosis not present

## 2019-09-21 DIAGNOSIS — Z8551 Personal history of malignant neoplasm of bladder: Secondary | ICD-10-CM | POA: Diagnosis not present

## 2019-09-27 DIAGNOSIS — Z1159 Encounter for screening for other viral diseases: Secondary | ICD-10-CM | POA: Diagnosis not present

## 2019-09-28 ENCOUNTER — Other Ambulatory Visit: Payer: PRIVATE HEALTH INSURANCE

## 2019-09-28 DIAGNOSIS — J069 Acute upper respiratory infection, unspecified: Secondary | ICD-10-CM | POA: Diagnosis not present

## 2019-10-11 DIAGNOSIS — R7301 Impaired fasting glucose: Secondary | ICD-10-CM | POA: Diagnosis not present

## 2019-10-11 DIAGNOSIS — E782 Mixed hyperlipidemia: Secondary | ICD-10-CM | POA: Diagnosis not present

## 2019-10-26 DIAGNOSIS — J019 Acute sinusitis, unspecified: Secondary | ICD-10-CM | POA: Diagnosis not present

## 2019-11-11 DIAGNOSIS — E875 Hyperkalemia: Secondary | ICD-10-CM | POA: Diagnosis not present

## 2019-11-11 DIAGNOSIS — E782 Mixed hyperlipidemia: Secondary | ICD-10-CM | POA: Diagnosis not present

## 2019-11-11 DIAGNOSIS — N183 Chronic kidney disease, stage 3 unspecified: Secondary | ICD-10-CM | POA: Diagnosis not present

## 2019-12-04 DIAGNOSIS — E782 Mixed hyperlipidemia: Secondary | ICD-10-CM | POA: Diagnosis not present

## 2019-12-04 DIAGNOSIS — N183 Chronic kidney disease, stage 3 unspecified: Secondary | ICD-10-CM | POA: Diagnosis not present

## 2019-12-04 DIAGNOSIS — E875 Hyperkalemia: Secondary | ICD-10-CM | POA: Diagnosis not present

## 2019-12-07 ENCOUNTER — Other Ambulatory Visit (HOSPITAL_COMMUNITY): Payer: Self-pay | Admitting: Urology

## 2019-12-07 ENCOUNTER — Other Ambulatory Visit: Payer: Self-pay

## 2019-12-07 ENCOUNTER — Ambulatory Visit (HOSPITAL_COMMUNITY)
Admission: RE | Admit: 2019-12-07 | Discharge: 2019-12-07 | Disposition: A | Discharge status: Home / Self Care | Payer: Medicare Other | Source: Ambulatory Visit | Attending: Urology | Admitting: Urology

## 2019-12-07 DIAGNOSIS — C670 Malignant neoplasm of trigone of bladder: Secondary | ICD-10-CM | POA: Insufficient documentation

## 2019-12-07 DIAGNOSIS — N1330 Unspecified hydronephrosis: Secondary | ICD-10-CM | POA: Diagnosis not present

## 2019-12-20 DIAGNOSIS — N130 Hydronephrosis with ureteropelvic junction obstruction: Secondary | ICD-10-CM | POA: Diagnosis not present

## 2019-12-20 DIAGNOSIS — C670 Malignant neoplasm of trigone of bladder: Secondary | ICD-10-CM | POA: Diagnosis not present

## 2020-01-06 DIAGNOSIS — H905 Unspecified sensorineural hearing loss: Secondary | ICD-10-CM | POA: Diagnosis not present

## 2020-01-10 DIAGNOSIS — C679 Malignant neoplasm of bladder, unspecified: Secondary | ICD-10-CM | POA: Diagnosis not present

## 2020-01-10 DIAGNOSIS — E782 Mixed hyperlipidemia: Secondary | ICD-10-CM | POA: Diagnosis not present

## 2020-01-10 DIAGNOSIS — D649 Anemia, unspecified: Secondary | ICD-10-CM | POA: Diagnosis not present

## 2020-01-12 DIAGNOSIS — E875 Hyperkalemia: Secondary | ICD-10-CM | POA: Diagnosis not present

## 2020-01-12 DIAGNOSIS — N1831 Chronic kidney disease, stage 3a: Secondary | ICD-10-CM | POA: Diagnosis not present

## 2020-01-12 DIAGNOSIS — H9113 Presbycusis, bilateral: Secondary | ICD-10-CM | POA: Diagnosis not present

## 2020-01-12 DIAGNOSIS — R7301 Impaired fasting glucose: Secondary | ICD-10-CM | POA: Diagnosis not present

## 2020-01-12 DIAGNOSIS — Z0001 Encounter for general adult medical examination with abnormal findings: Secondary | ICD-10-CM | POA: Diagnosis not present

## 2020-01-12 DIAGNOSIS — E782 Mixed hyperlipidemia: Secondary | ICD-10-CM | POA: Diagnosis not present

## 2020-01-12 DIAGNOSIS — K219 Gastro-esophageal reflux disease without esophagitis: Secondary | ICD-10-CM | POA: Diagnosis not present

## 2020-01-30 DIAGNOSIS — N1831 Chronic kidney disease, stage 3a: Secondary | ICD-10-CM | POA: Diagnosis not present

## 2020-01-30 DIAGNOSIS — D649 Anemia, unspecified: Secondary | ICD-10-CM | POA: Diagnosis not present

## 2020-01-30 DIAGNOSIS — Z0001 Encounter for general adult medical examination with abnormal findings: Secondary | ICD-10-CM | POA: Diagnosis not present

## 2020-01-30 DIAGNOSIS — E782 Mixed hyperlipidemia: Secondary | ICD-10-CM | POA: Diagnosis not present

## 2020-01-30 DIAGNOSIS — K219 Gastro-esophageal reflux disease without esophagitis: Secondary | ICD-10-CM | POA: Diagnosis not present

## 2020-02-20 DIAGNOSIS — Z936 Other artificial openings of urinary tract status: Secondary | ICD-10-CM | POA: Diagnosis not present

## 2020-02-20 DIAGNOSIS — Z8551 Personal history of malignant neoplasm of bladder: Secondary | ICD-10-CM | POA: Diagnosis not present

## 2020-04-02 ENCOUNTER — Other Ambulatory Visit: Payer: Self-pay | Admitting: Pharmacist

## 2020-04-04 DIAGNOSIS — D649 Anemia, unspecified: Secondary | ICD-10-CM | POA: Diagnosis not present

## 2020-04-04 DIAGNOSIS — K219 Gastro-esophageal reflux disease without esophagitis: Secondary | ICD-10-CM | POA: Diagnosis not present

## 2020-04-04 DIAGNOSIS — E782 Mixed hyperlipidemia: Secondary | ICD-10-CM | POA: Diagnosis not present

## 2020-04-04 DIAGNOSIS — C679 Malignant neoplasm of bladder, unspecified: Secondary | ICD-10-CM | POA: Diagnosis not present

## 2020-04-04 DIAGNOSIS — N183 Chronic kidney disease, stage 3 unspecified: Secondary | ICD-10-CM | POA: Diagnosis not present

## 2020-04-23 DIAGNOSIS — L821 Other seborrheic keratosis: Secondary | ICD-10-CM | POA: Diagnosis not present

## 2020-04-23 DIAGNOSIS — B078 Other viral warts: Secondary | ICD-10-CM | POA: Diagnosis not present

## 2020-04-23 DIAGNOSIS — L570 Actinic keratosis: Secondary | ICD-10-CM | POA: Diagnosis not present

## 2020-04-23 DIAGNOSIS — X32XXXA Exposure to sunlight, initial encounter: Secondary | ICD-10-CM | POA: Diagnosis not present

## 2020-04-23 DIAGNOSIS — D225 Melanocytic nevi of trunk: Secondary | ICD-10-CM | POA: Diagnosis not present

## 2020-06-12 DIAGNOSIS — Z936 Other artificial openings of urinary tract status: Secondary | ICD-10-CM | POA: Diagnosis not present

## 2020-06-12 DIAGNOSIS — Z8551 Personal history of malignant neoplasm of bladder: Secondary | ICD-10-CM | POA: Diagnosis not present

## 2020-06-19 DIAGNOSIS — Z8551 Personal history of malignant neoplasm of bladder: Secondary | ICD-10-CM | POA: Diagnosis not present

## 2020-06-19 DIAGNOSIS — C670 Malignant neoplasm of trigone of bladder: Secondary | ICD-10-CM | POA: Diagnosis not present

## 2020-06-19 DIAGNOSIS — Z936 Other artificial openings of urinary tract status: Secondary | ICD-10-CM | POA: Diagnosis not present

## 2020-06-22 ENCOUNTER — Other Ambulatory Visit (HOSPITAL_COMMUNITY): Payer: Self-pay | Admitting: Urology

## 2020-06-22 ENCOUNTER — Ambulatory Visit (HOSPITAL_COMMUNITY)
Admission: RE | Admit: 2020-06-22 | Discharge: 2020-06-22 | Disposition: A | Discharge status: Home / Self Care | Payer: Medicare Other | Source: Ambulatory Visit | Attending: Urology | Admitting: Urology

## 2020-06-22 ENCOUNTER — Other Ambulatory Visit: Payer: Self-pay

## 2020-06-22 DIAGNOSIS — I700 Atherosclerosis of aorta: Secondary | ICD-10-CM | POA: Diagnosis not present

## 2020-06-22 DIAGNOSIS — C670 Malignant neoplasm of trigone of bladder: Secondary | ICD-10-CM | POA: Insufficient documentation

## 2020-06-22 DIAGNOSIS — N1330 Unspecified hydronephrosis: Secondary | ICD-10-CM | POA: Diagnosis not present

## 2020-06-22 DIAGNOSIS — J90 Pleural effusion, not elsewhere classified: Secondary | ICD-10-CM | POA: Diagnosis not present

## 2020-06-22 DIAGNOSIS — N261 Atrophy of kidney (terminal): Secondary | ICD-10-CM | POA: Diagnosis not present

## 2020-06-22 DIAGNOSIS — N2889 Other specified disorders of kidney and ureter: Secondary | ICD-10-CM | POA: Diagnosis not present

## 2020-06-26 DIAGNOSIS — C670 Malignant neoplasm of trigone of bladder: Secondary | ICD-10-CM | POA: Diagnosis not present

## 2020-06-26 DIAGNOSIS — N130 Hydronephrosis with ureteropelvic junction obstruction: Secondary | ICD-10-CM | POA: Diagnosis not present

## 2020-07-18 DIAGNOSIS — E782 Mixed hyperlipidemia: Secondary | ICD-10-CM | POA: Diagnosis not present

## 2020-07-18 DIAGNOSIS — N183 Chronic kidney disease, stage 3 unspecified: Secondary | ICD-10-CM | POA: Diagnosis not present

## 2020-07-18 DIAGNOSIS — C670 Malignant neoplasm of trigone of bladder: Secondary | ICD-10-CM | POA: Diagnosis not present

## 2020-07-18 DIAGNOSIS — D649 Anemia, unspecified: Secondary | ICD-10-CM | POA: Diagnosis not present

## 2020-07-18 DIAGNOSIS — R7301 Impaired fasting glucose: Secondary | ICD-10-CM | POA: Diagnosis not present

## 2020-07-18 DIAGNOSIS — H9202 Otalgia, left ear: Secondary | ICD-10-CM | POA: Diagnosis not present

## 2020-07-19 DIAGNOSIS — R7301 Impaired fasting glucose: Secondary | ICD-10-CM | POA: Diagnosis not present

## 2020-07-19 DIAGNOSIS — H9113 Presbycusis, bilateral: Secondary | ICD-10-CM | POA: Diagnosis not present

## 2020-07-19 DIAGNOSIS — K219 Gastro-esophageal reflux disease without esophagitis: Secondary | ICD-10-CM | POA: Diagnosis not present

## 2020-07-19 DIAGNOSIS — N1831 Chronic kidney disease, stage 3a: Secondary | ICD-10-CM | POA: Diagnosis not present

## 2020-07-19 DIAGNOSIS — E782 Mixed hyperlipidemia: Secondary | ICD-10-CM | POA: Diagnosis not present

## 2020-08-06 ENCOUNTER — Emergency Department (HOSPITAL_COMMUNITY): Payer: Medicare Other

## 2020-08-06 ENCOUNTER — Observation Stay (HOSPITAL_COMMUNITY)
Admission: EM | Admit: 2020-08-06 | Discharge: 2020-08-06 | Disposition: A | Discharge status: Home / Self Care | Payer: Medicare Other | Attending: Family Medicine | Admitting: Family Medicine

## 2020-08-06 ENCOUNTER — Other Ambulatory Visit: Payer: Self-pay

## 2020-08-06 ENCOUNTER — Observation Stay (HOSPITAL_COMMUNITY): Payer: Medicare Other

## 2020-08-06 ENCOUNTER — Encounter (HOSPITAL_COMMUNITY): Payer: Self-pay | Admitting: Emergency Medicine

## 2020-08-06 ENCOUNTER — Observation Stay (HOSPITAL_BASED_OUTPATIENT_CLINIC_OR_DEPARTMENT_OTHER): Payer: Medicare Other

## 2020-08-06 DIAGNOSIS — Z8551 Personal history of malignant neoplasm of bladder: Secondary | ICD-10-CM | POA: Insufficient documentation

## 2020-08-06 DIAGNOSIS — H538 Other visual disturbances: Secondary | ICD-10-CM | POA: Diagnosis present

## 2020-08-06 DIAGNOSIS — H748X1 Other specified disorders of right middle ear and mastoid: Secondary | ICD-10-CM | POA: Diagnosis not present

## 2020-08-06 DIAGNOSIS — I6523 Occlusion and stenosis of bilateral carotid arteries: Secondary | ICD-10-CM | POA: Diagnosis not present

## 2020-08-06 DIAGNOSIS — K219 Gastro-esophageal reflux disease without esophagitis: Secondary | ICD-10-CM | POA: Diagnosis present

## 2020-08-06 DIAGNOSIS — C679 Malignant neoplasm of bladder, unspecified: Secondary | ICD-10-CM | POA: Diagnosis present

## 2020-08-06 DIAGNOSIS — Z20822 Contact with and (suspected) exposure to covid-19: Secondary | ICD-10-CM | POA: Insufficient documentation

## 2020-08-06 DIAGNOSIS — I10 Essential (primary) hypertension: Secondary | ICD-10-CM | POA: Insufficient documentation

## 2020-08-06 DIAGNOSIS — Z79899 Other long term (current) drug therapy: Secondary | ICD-10-CM | POA: Insufficient documentation

## 2020-08-06 DIAGNOSIS — Z87891 Personal history of nicotine dependence: Secondary | ICD-10-CM | POA: Diagnosis not present

## 2020-08-06 DIAGNOSIS — R42 Dizziness and giddiness: Secondary | ICD-10-CM

## 2020-08-06 DIAGNOSIS — G459 Transient cerebral ischemic attack, unspecified: Secondary | ICD-10-CM | POA: Diagnosis not present

## 2020-08-06 DIAGNOSIS — Z9889 Other specified postprocedural states: Secondary | ICD-10-CM

## 2020-08-06 DIAGNOSIS — G319 Degenerative disease of nervous system, unspecified: Secondary | ICD-10-CM | POA: Diagnosis not present

## 2020-08-06 DIAGNOSIS — I6782 Cerebral ischemia: Secondary | ICD-10-CM | POA: Diagnosis not present

## 2020-08-06 LAB — ECHOCARDIOGRAM COMPLETE
AR max vel: 3.8 cm2
AV Area VTI: 4.17 cm2
AV Area mean vel: 3.11 cm2
AV Mean grad: 2.3 mmHg
AV Peak grad: 3.8 mmHg
Ao pk vel: 0.97 m/s
Area-P 1/2: 5.2 cm2
Height: 68 in
S' Lateral: 3.01 cm
Weight: 2640 oz

## 2020-08-06 LAB — DIFFERENTIAL
Abs Immature Granulocytes: 0.03 10*3/uL (ref 0.00–0.07)
Basophils Absolute: 0 10*3/uL (ref 0.0–0.1)
Basophils Relative: 0 %
Eosinophils Absolute: 0.1 10*3/uL (ref 0.0–0.5)
Eosinophils Relative: 2 %
Immature Granulocytes: 0 %
Lymphocytes Relative: 14 %
Lymphs Abs: 1.1 10*3/uL (ref 0.7–4.0)
Monocytes Absolute: 0.6 10*3/uL (ref 0.1–1.0)
Monocytes Relative: 8 %
Neutro Abs: 6.2 10*3/uL (ref 1.7–7.7)
Neutrophils Relative %: 76 %

## 2020-08-06 LAB — COMPREHENSIVE METABOLIC PANEL
ALT: 16 U/L (ref 0–44)
AST: 19 U/L (ref 15–41)
Albumin: 4.1 g/dL (ref 3.5–5.0)
Alkaline Phosphatase: 81 U/L (ref 38–126)
Anion gap: 9 (ref 5–15)
BUN: 28 mg/dL — ABNORMAL HIGH (ref 8–23)
CO2: 24 mmol/L (ref 22–32)
Calcium: 9.3 mg/dL (ref 8.9–10.3)
Chloride: 102 mmol/L (ref 98–111)
Creatinine, Ser: 1.44 mg/dL — ABNORMAL HIGH (ref 0.61–1.24)
GFR, Estimated: 50 mL/min — ABNORMAL LOW (ref >60)
Glucose, Bld: 104 mg/dL — ABNORMAL HIGH (ref 70–99)
Potassium: 4.3 mmol/L (ref 3.5–5.1)
Sodium: 135 mmol/L (ref 135–145)
Total Bilirubin: 1.3 mg/dL — ABNORMAL HIGH (ref 0.3–1.2)
Total Protein: 7.6 g/dL (ref 6.5–8.1)

## 2020-08-06 LAB — RAPID URINE DRUG SCREEN, HOSP PERFORMED
Amphetamines: NOT DETECTED
Barbiturates: NOT DETECTED
Benzodiazepines: NOT DETECTED
Cocaine: NOT DETECTED
Opiates: NOT DETECTED
Tetrahydrocannabinol: NOT DETECTED

## 2020-08-06 LAB — URINALYSIS, ROUTINE W REFLEX MICROSCOPIC
Bilirubin Urine: NEGATIVE
Glucose, UA: NEGATIVE mg/dL
Ketones, ur: NEGATIVE mg/dL
Nitrite: POSITIVE — AB
Protein, ur: 30 mg/dL — AB
Specific Gravity, Urine: 1.011 (ref 1.005–1.030)
WBC, UA: >50 WBC/hpf — ABNORMAL HIGH (ref 0–5)
pH: 7 (ref 5.0–8.0)

## 2020-08-06 LAB — CBC
HCT: 41.6 % (ref 39.0–52.0)
Hemoglobin: 13.5 g/dL (ref 13.0–17.0)
MCH: 29.6 pg (ref 26.0–34.0)
MCHC: 32.5 g/dL (ref 30.0–36.0)
MCV: 91.2 fL (ref 80.0–100.0)
Platelets: 221 10*3/uL (ref 150–400)
RBC: 4.56 MIL/uL (ref 4.22–5.81)
RDW: 12.7 % (ref 11.5–15.5)
WBC: 8.1 10*3/uL (ref 4.0–10.5)
nRBC: 0 % (ref 0.0–0.2)

## 2020-08-06 LAB — PROTIME-INR
INR: 1.1 (ref 0.8–1.2)
Prothrombin Time: 13.7 seconds (ref 11.4–15.2)

## 2020-08-06 LAB — ETHANOL: Alcohol, Ethyl (B): <10 mg/dL (ref <10)

## 2020-08-06 LAB — RESPIRATORY PANEL BY RT PCR (FLU A&B, COVID)
Influenza A by PCR: NEGATIVE
Influenza B by PCR: NEGATIVE
SARS Coronavirus 2 by RT PCR: NEGATIVE

## 2020-08-06 LAB — APTT: aPTT: 29 seconds (ref 24–36)

## 2020-08-06 MED ORDER — ATORVASTATIN CALCIUM 40 MG PO TABS
80.0000 mg | ORAL_TABLET | Freq: Once | ORAL | Status: AC
  Administered 2020-08-06: 80 mg via ORAL
  Filled 2020-08-06: qty 2

## 2020-08-06 MED ORDER — ASPIRIN 81 MG PO CHEW
81.0000 mg | CHEWABLE_TABLET | Freq: Once | ORAL | Status: AC
  Administered 2020-08-06: 81 mg via ORAL
  Filled 2020-08-06: qty 1

## 2020-08-06 MED ORDER — ASPIRIN EC 81 MG PO TBEC: 81.0000 mg | DELAYED_RELEASE_TABLET | Freq: Every day | ORAL | 11 refills | Status: AC

## 2020-08-06 MED ORDER — IOHEXOL 350 MG/ML SOLN
75.0000 mL | Freq: Once | INTRAVENOUS | Status: AC | PRN
  Administered 2020-08-06: 100 mL via INTRAVENOUS

## 2020-08-06 MED ORDER — CLOPIDOGREL BISULFATE 75 MG PO TABS
75.0000 mg | ORAL_TABLET | Freq: Once | ORAL | Status: AC
  Administered 2020-08-06: 75 mg via ORAL
  Filled 2020-08-06: qty 1

## 2020-08-06 MED ORDER — ATORVASTATIN CALCIUM 80 MG PO TABS: 80.0000 mg | ORAL_TABLET | Freq: Every day | ORAL | 4 refills | Status: AC

## 2020-08-06 NOTE — ED Notes (Signed)
Echo in with pt

## 2020-08-06 NOTE — ED Notes (Signed)
Pt transported to CT ?

## 2020-08-06 NOTE — Progress Notes (Signed)
*  PRELIMINARY RESULTS* Echocardiogram 2D Echocardiogram has been performed.  [name] 08/06/2020, 3:11 PM

## 2020-08-06 NOTE — Progress Notes (Signed)
Brief Neuro note:  Discussed this case over phone with Dr. Sabra Heck. Per hx provided by Dr. Sabra Heck:  Briefly, [name] is a 79 y.o. male with hx of HLD, bladder cancer, does not smoke who presents with an episode of dysequilibrium and visual smyptoms lasting 15 mins with resolution of symptoms.  Today, he was making breakfast, he was sitting down and had a sudden onset episode of balance went out, could not stand up. This lasted about 15 mins. He also had vision symptoms with a fish across his vision that happened at the same time. Wife brought him to the ED. By the time he came in, he was feeling much better and is now back to baseline.   Vitals with SBP 143, EKG per ED sinus with occasional PVC. No on any anticoagulation. Labs with mildly elevated total bili, mildly elevated creatinine, UDS is negative. CTH without contrast with no large territory infarct, no ICH. He does seem to have small vessel disease and noted multifocal vascular calcification.  Impression: Symptoms could potentially localize to posterior circulation specially with the dysequilibrium and vision symptoms occurring at the same time. Althou it would be atypical for stroke to present with positive visual symptoms specially with TIA. No accompanying headache to suggest migraine phenomenon. With his history and noted multifocal vascular calcification on the [facility] without contrast, this could be TIA or transient cerebrovascular ischemia due to symptomatic stenosis.  Recs: - Asprin 81mg  and Plavix 75mg  once now. - Orthostatic vitals x 1. - Recommend brain imaging with MRI Brain without contrast - Recommend Vascular imaging with CT Angio head and Neck - Recommend obtaining TTE - Recommend obtaining Lipid panel with LDL - continue home statins. - Recommend HbA1c - Antithrombotic - Aspirin 81mg  and Plavix 75mg  daily x 21 days, followed by just Aspirin 81mg  daily. - Recommend DVT ppx - SBP goal - permissive hypertension first  24 h < 220/110. Hold home meds.  - Recommend Telemetry monitoring for arrythmia - Stroke education booklet - Recommend PT/OT/SLP consult

## 2020-08-06 NOTE — ED Notes (Signed)
Pt transported to radiology.

## 2020-08-06 NOTE — ED Notes (Signed)
Cardiology called about performing echo today. Stated they will be down soon to do it. Dr courage notified.

## 2020-08-06 NOTE — ED Provider Notes (Signed)
[facility] EMERGENCY DEPARTMENT Provider Note   CSN: [id number] Arrival date & time: 08/06/20  6789     History Chief Complaint  Patient presents with  . Eye Problem    [name] is a 79 y.o. male.  HPI   This patient is a 79 year old male, he has a known history of bladder cancer status post diverting urostomy a couple of years ago.  He has a history of hypercholesterolemia for which he takes a cholesterol medication at night and he takes an acid reflux medication in the morning.  He has never had cardiac problems, he has never had arrhythmias and he does not take any anticoagulants or aspirin-containing products.  The patient presents today stating that he had several episodes over the last couple of weeks where he feels an acute visual change.  He states that it feels like there is a abnormal color going across his vision which lasted for several minutes sometimes up to 10 or 15 minutes and then resolves.  Yesterday it occurred and then again occurred this morning shortly after developing acute onset of ataxia.  He states he has no headache, no chest pain or shortness of breath or palpitations, he feels like the ataxia occurred immediately, total disequilibrium, having to hold onto things to walk but denies a feeling of room spinning.  That lasted for 15 minutes or so and then slowly resolved.  At this time the patient states he has no symptoms.  When he got out of the car to come into the hospital he was able to ambulate unassisted and at this time continues to be able to ambulate and has no visual changes.  He has never had a stroke that he knows of  Past Medical History:  Diagnosis Date  . Bladder cancer [facility]) dx'd 12/2017   urologist-  dr Tresa Moore  . BPH (benign prostatic hyperplasia)   . Full dentures   . GERD (gastroesophageal reflux disease)   . Hiatal hernia   . History of bladder stone   . History of kidney stones    per pt hx passed stones spontenaously  .  Hypercholesterolemia   . Wears glasses   . Wears hearing aid in both ears     Patient Active Problem List   Diagnosis Date Noted  . Dizziness 08/06/2020  . Port-A-Cath in place 02/10/2018  . Encounter for antineoplastic chemotherapy 02/03/2018  . Bladder cancer ([place]) 12/29/2017  . Encounter for screening colonoscopy 03/26/2012  . GERD (gastroesophageal reflux disease) 03/26/2012  . Elevated bilirubin 05/26/2011  . RLQ abdominal pain 05/26/2011    Past Surgical History:  Procedure Laterality Date  . COLONOSCOPY WITH ESOPHAGOGASTRODUODENOSCOPY (EGD)  04-08-2012  dr [name]  . CYSTO/  BLADDER STONE EXTRACTIONS  1998 approx.  . CYSTOSCOPY WITH INJECTION N/A 06/11/2018   Procedure: CYSTOSCOPY WITH INJECTION;  Surgeon: Alexis Frock, MD;  Location: [facility];  Service: Urology;  Laterality: N/A;  . IR FLUORO GUIDE PORT INSERTION RIGHT  01/07/2018  . IR REMOVAL TUN ACCESS W/ PORT W/O FL MOD SED  04/05/2019  . IR US GUIDE VASC ACCESS RIGHT  01/07/2018  . ROBOT ASSISTED LAPAROSCOPIC RADICAL PROSTATECTOMY N/A 06/11/2018   Procedure: XI ROBOT ASSISTED LAPAROSCOPIC RADICAL CYSTOPROSTATECTOMY BILATERAL PELVIC LYMPHADENECTOMY,NO ORTHOTOPIC NEOBLADDER - ILEAL;  Surgeon: Alexis Frock, MD;  Location: [facility];  Service: Urology;  Laterality: N/A;  . TRANSURETHRAL RESECTION OF BLADDER TUMOR N/A 12/11/2017   Procedure: CYSTOSCOPY WITH BILATERAL RETROGRADE PYELOGRAM, TRANSURETHRAL RESECTION OF BLADDER TUMOR (TURBT);  Surgeon:  Alexis Frock, MD;  Location: [facility];  Service: Urology;  Laterality: N/A;  . UMBILICAL HERNIA REPAIR  2008  . VASECTOMY  1980s       Family History  Problem Relation Age of Onset  . Colon cancer Paternal Grandfather     Social History   Tobacco Use  . Smoking status: Former Smoker    Packs/day: 0.50    Years: 20.00    Pack years: 10.00    Types: Cigarettes    Quit date: 03/26/1998    Years since quitting: 22.3  . Smokeless tobacco: Never Used  Vaping  Use  . Vaping Use: Never used  Substance Use Topics  . Alcohol use: Yes    Comment: occasional wine, beer  . Drug use: No    Home Medications Prior to Admission medications   Medication Sig Start Date End Date Taking? Authorizing Provider  fenofibrate 160 MG tablet Take 160 mg by mouth at bedtime.    [provider]  omeprazole (PRILOSEC) 20 MG capsule Take 20 mg by mouth every morning.  03/26/12   Andria Meuse, NP  senna-docusate (SENOKOT-S) 8.6-50 MG tablet Take 1 tablet by mouth 2 (two) times daily. While taking strong pain meds to prevent constipation Patient not taking: Reported on 08/02/2019 06/16/18   Alexis Frock, MD  simvastatin (ZOCOR) 40 MG tablet Take 40 mg by mouth at bedtime.    [provider]    Allergies    Emend [fosaprepitant dimeglumine]  Review of Systems   Review of Systems  All other systems reviewed and are negative.   Physical Exam Updated Vital Signs BP 129/89   Pulse 70   Temp 98.1 F (36.7 C) (Oral)   Resp 20   Ht 1.727 m (5\' 8" )   Wt 74.8 kg   SpO2 100%   BMI 25.09 kg/m   Physical Exam Vitals and nursing note reviewed.  Constitutional:      General: He is not in acute distress.    Appearance: He is well-developed.  HENT:     Head: Normocephalic and atraumatic.     Mouth/Throat:     Pharynx: No oropharyngeal exudate.  Eyes:     General: No scleral icterus.       Right eye: No discharge.        Left eye: No discharge.     Conjunctiva/sclera: Conjunctivae normal.     Pupils: Pupils are equal, round, and reactive to light.  Neck:     Thyroid: No thyromegaly.     Vascular: No JVD.  Cardiovascular:     Rate and Rhythm: Normal rate. Rhythm irregular.     Heart sounds: Normal heart sounds. No murmur heard.  No friction rub. No gallop.   Pulmonary:     Effort: Pulmonary effort is normal. No respiratory distress.     Breath sounds: Normal breath sounds. No wheezing or rales.  Abdominal:     General: Bowel sounds  are normal. There is no distension.     Palpations: Abdomen is soft. There is no mass.     Tenderness: There is no abdominal tenderness.  Musculoskeletal:        General: No tenderness. Normal range of motion.     Cervical back: Normal range of motion and neck supple.  Lymphadenopathy:     Cervical: No cervical adenopathy.  Skin:    General: Skin is warm and dry.     Findings: No erythema or rash.  Neurological:  Mental Status: He is alert.     Coordination: Coordination normal.     Comments: Speech is clear, cranial nerves III through XII are intact, memory is intact, strength is normal in all 4 extremities including grips, sensation is intact to light touch and pinprick in all 4 extremities. Coordination as tested by finger-nose-finger is normal, no limb ataxia. Normal gait, normal reflexes at the patellar tendons bilaterally  Psychiatric:        Behavior: Behavior normal.     ED Results / Procedures / Treatments   Labs (all labs ordered are listed, but only abnormal results are displayed) Labs Reviewed  COMPREHENSIVE METABOLIC PANEL - Abnormal; Notable for the following components:      Result Value   Glucose, Bld 104 (*)    BUN 28 (*)    Creatinine, Ser 1.44 (*)    Total Bilirubin 1.3 (*)    GFR, Estimated 50 (*)    All other components within normal limits  URINALYSIS, ROUTINE W REFLEX MICROSCOPIC - Abnormal; Notable for the following components:   APPearance CLOUDY (*)    Hgb urine dipstick SMALL (*)    Protein, ur 30 (*)    Nitrite POSITIVE (*)    Leukocytes,Ua LARGE (*)    WBC, UA >50 (*)    Bacteria, UA MANY (*)    Non Squamous Epithelial 0-5 (*)    All other components within normal limits  ETHANOL  PROTIME-INR  APTT  CBC  DIFFERENTIAL  RAPID URINE DRUG SCREEN, HOSP PERFORMED    EKG None  Radiology CT HEAD WO CONTRAST  Result Date: 08/06/2020 CLINICAL DATA:  Dizziness EXAM: CT HEAD WITHOUT CONTRAST TECHNIQUE: Contiguous axial images were obtained  from the base of the skull through the vertex without intravenous contrast. COMPARISON:  None. FINDINGS: Brain: There is moderate diffuse atrophy. There is no intracranial mass, hemorrhage, extra-axial fluid collection, or midline shift. There is patchy small vessel disease in the centra semiovale bilaterally. No acute appearing infarct is evident. Vascular: No hyperdense vessel. There is calcification in each carotid siphon region. Skull: Bony calvarium appears intact. Sinuses/Orbits: There is mucosal thickening in multiple ethmoid air cells. Other visualized paranasal sinuses are clear. Orbits appear symmetric bilaterally. Other: Mastoid air cells are clear. IMPRESSION: Atrophy with periventricular small vessel disease. No evident acute infarct. No mass or hemorrhage. There are foci of arterial vascular calcification. There is mucosal thickening in multiple ethmoid air cells. Electronically Signed   By: Lowella Grip III M.D.   On: 08/06/2020 11:12    Procedures Procedures (including critical care time)  Medications Ordered in ED Medications  aspirin chewable tablet 81 mg (81 mg Oral Given 08/06/20 1205)  clopidogrel (PLAVIX) tablet 75 mg (75 mg Oral Given 08/06/20 1205)  iohexol (OMNIPAQUE) 350 MG/ML injection 75 mL (100 mLs Intravenous Contrast Given 08/06/20 1246)    ED Course  I have reviewed the triage vital signs and the nursing notes.  Pertinent labs & imaging results that were available during my care of the patient were reviewed by me and considered in my medical decision making (see chart for details).  Clinical Course as of Aug 06 1312  Mon Aug 06, 2020  1158 Consult to Neuro - they will see pt Request CTA head and neck   [BM]    Clinical Course User Index [BM] Noemi Chapel, MD   MDM Rules/Calculators/A&P  This patient has signs and symptoms that could be consistent with a posterior circulation transient ischemic attack.  He is currently  asymptomatic at this time but has had some waxing and waning systems over the last couple of weeks that raise concern for ischemia.  He currently has a normal neurologic exam including his peripheral visual fields and his gait and his speech, he has no acute changes in his vision.  That being said he does have some irregularity to his heart rate and will need to be evaluated for A. fib, I will ask the stroke team to evaluate him as well.  The patient has a CT scan ordered, his blood pressure is 160/104.  I discussed the care with the neurologist, they want the patient to be treated with aspirin and Plavix and to be evaluated in the hospital for possible strokelike syndrome or TIA.  Recommend CT angiogram, this was ordered.  Discussed with the hospitalist at 1:10 PM, they will come to see the patient for admission  Final Clinical Impression(s) / ED Diagnoses Final diagnoses:  TIA (transient ischemic attack)  Primary hypertension      Noemi Chapel, MD 08/06/20 1313

## 2020-08-06 NOTE — Consult Note (Signed)
Patient Demographics:    [name], is a 79 y.o. male  MRN: [id number]   DOB - 10-05-1941  Admit Date - 08/06/2020  Outpatient Primary MD for the patient is [name], MD   Assessment & Plan:    Principal Problem:   TIA (transient ischemic attack) Active Problems:   GERD (gastroesophageal reflux disease)   Bladder cancer ([place])   Dizziness   History of urostomy- h/o Bladder cancer   1)TIA--per pt and his wife--he is back to baseline from a mentation and neuro standpoint  -MRI brain shows old right basal ganglia lacunar infarct -MRI brain, CTA head and CTA neck as well as CT head without contrast without acute strokes or LVO -Echo with preserved EF of 55 to 60%, no regional wall motion normalities, patient does have grade 1 diastolic dysfunction, no significant valvular abnormalities -PTA patient was not on aspirin -Start aspirin 81 mg daily -PTA patient was simvastatin 40 mg daily, changed to Lipitor -Even if his lipid panel is within desired limits, patient should still take Lipitor/Statin for it's Pleiotropic effects (beyond cholesterol lowering benefits) --Outpatient follow-up with neurologist Dr. Merlene Laughter in 4 to 6 weeks advised  2) abnormal urinary analysis--patient has a urostomy bag, which according to him he has not changed in over 4 days -Denies urinary symptoms, suspect contaminated urine sample -Will not treat this asymptomatic bacteriuria from the urostomy bag at this time -Patient follow-up with PCP should he develop symptoms -Patient and wife verbalized understanding  3) CKD 3a--- renal function currently at baseline,, continue to avoid nephrotoxic agents  4) history of bladder cancer status post urostomy----no new concerns  Discharge instructions:- 1)Please follow-up with Neurologist  Dr. Phillips Odor-- Phone: [phone], Address: [address], [place], [place] [address] in 4 to 6 weeks for recheck and reevaluation.  Please call to make appointment with him  2) please follow-up with Dr. Nevada Crane if you develop any urinary tract infection type symptoms-including discomfort/pain, unusual smell to urine, nausea, fever or chills  3)Even though your cholesterol/lipid panel is within desired limits, patient should still take Lipitor/Statin for it's Pleiotropic effects (beyond cholesterol lowering benefits)--in order to reduce his stroke risk going forward  4) take a baby aspirin with food daily for secondary prevention of stroke to this episode of TIA/mini stroke,  5) you have underlying chronic kidney disease so Avoid ibuprofen/Advil/Aleve/Motrin/Goody Powders/Naproxen/BC powders/Meloxicam/Diclofenac/Indomethacin and other Nonsteroidal anti-inflammatory medications as these will make you more likely to bleed and can cause stomach ulcers, can also cause Kidney problems.     Disposition--discharge home from the ED with wife with medication changes as outlined with outpatient follow-up with neurologist Dr. Merlene Laughter     With History of - Reviewed by me  Past Medical History:  Diagnosis Date  . Bladder cancer [facility]) dx'd 12/2017   urologist-  dr Tresa Moore  . BPH (benign prostatic hyperplasia)   . Full dentures   . GERD (gastroesophageal reflux  disease)   . Hiatal hernia   . History of bladder stone   . History of kidney stones    per pt hx passed stones spontenaously  . Hypercholesterolemia   . Wears glasses   . Wears hearing aid in both ears       Past Surgical History:  Procedure Laterality Date  . COLONOSCOPY WITH ESOPHAGOGASTRODUODENOSCOPY (EGD)  04-08-2012  dr [name]  . CYSTO/  BLADDER STONE EXTRACTIONS  1998 approx.  . CYSTOSCOPY WITH INJECTION N/A 06/11/2018   Procedure: CYSTOSCOPY WITH INJECTION;  Surgeon: Alexis Frock, MD;  Location: [facility];  Service:  Urology;  Laterality: N/A;  . IR FLUORO GUIDE PORT INSERTION RIGHT  01/07/2018  . IR REMOVAL TUN ACCESS W/ PORT W/O FL MOD SED  04/05/2019  . IR US GUIDE VASC ACCESS RIGHT  01/07/2018  . ROBOT ASSISTED LAPAROSCOPIC RADICAL PROSTATECTOMY N/A 06/11/2018   Procedure: XI ROBOT ASSISTED LAPAROSCOPIC RADICAL CYSTOPROSTATECTOMY BILATERAL PELVIC LYMPHADENECTOMY,NO ORTHOTOPIC NEOBLADDER - ILEAL;  Surgeon: Alexis Frock, MD;  Location: [facility];  Service: Urology;  Laterality: N/A;  . TRANSURETHRAL RESECTION OF BLADDER TUMOR N/A 12/11/2017   Procedure: CYSTOSCOPY WITH BILATERAL RETROGRADE PYELOGRAM, TRANSURETHRAL RESECTION OF BLADDER TUMOR (TURBT);  Surgeon: Alexis Frock, MD;  Location: [facility];  Service: Urology;  Laterality: N/A;  . UMBILICAL HERNIA REPAIR  2008  . VASECTOMY  1980s      Chief Complaint  Patient presents with  . Eye Problem      HPI:    [name]  is a 79 y.o. male reformed smoker with past medical history relevant for bladder CA with prior urostomy, also has evidence of prior old right basal ganglia lacunar infarct on MRI brain,h/o HLD resents to the ED with concerns about visual disturbance and disequilibrium/dizziness and gait issues that resolved within 15 minutes - -In the ED symptoms have not returned -As per patient and wife patient appears to be back to baseline -EKG in the ED without acute findings patient appears to be in sinus rhythm -UDS unremarkable -CT head without contrast, CTA head and neck and MRI brain without acute stroke however there is evidence of old right basal ganglia lacunar infarct - -Patient renal function appears to be at his baseline at this time -Denies chest pain palpitations or dizziness -Patient is hard of hearing so wife is helping with history taking -Abnormal UA noted, patient denies any urinary symptoms, he has a urostomy bag from prior bladder CA No fever  Or chills   No Nausea, Vomiting or Diarrhea   Review of  systems:    In addition to the HPI above,   A full Review of  Systems was done, all other systems reviewed are negative except as noted above in HPI , .    Social History:  Reviewed by me    Social History   Tobacco Use  . Smoking status: Former Smoker    Packs/day: 0.50    Years: 20.00    Pack years: 10.00    Types: Cigarettes    Quit date: 03/26/1998    Years since quitting: 22.3  . Smokeless tobacco: Never Used  Substance Use Topics  . Alcohol use: Yes    Comment: occasional wine, beer       Family History :  Reviewed by me    Family History  Problem Relation Age of Onset  . Colon cancer Paternal Grandfather       Home Medications:   Prior to Admission medications  Medication Sig Start Date End Date Taking? Authorizing Provider  pantoprazole (PROTONIX) 20 MG tablet Take 20 mg by mouth daily. 07/19/20  Yes [provider]  aspirin EC 81 MG tablet Take 1 tablet (81 mg total) by mouth daily with breakfast. For Stroke Prevention 08/06/20 08/06/21  Roxan Hockey, MD  atorvastatin (LIPITOR) 80 MG tablet Take 1 tablet (80 mg total) by mouth daily. For stroke Prevention 08/06/20 08/06/21  Roxan Hockey, MD     Allergies:     Allergies  Allergen Reactions  . Emend [Fosaprepitant Dimeglumine] Cough    Reaction during infusion with cough, erythema of face and neck.       Physical Exam:   Vitals  Blood pressure 126/90, pulse 73, temperature 98 F (36.7 C), temperature source Oral, resp. rate 19, height 5\' 8"  (1.727 m), weight 74.8 kg, SpO2 100 %.  Physical Examination: General appearance - alert, well appearing, and in no distress  Mental status - alert, oriented to person, place, and time,  Eyes - sclera anicteric Neck - supple, no JVD elevation , Chest - clear  to auscultation bilaterally, symmetrical air movement,  Heart - S1 and S2 normal, regular  Abdomen - soft, nontender, nondistended, urostomy bag noted Neurological - screening  mental status exam normal, neck supple without rigidity, cranial nerves II through XII intact, DTR's normal and symmetric -Gait is steady, as per patient and wife neuro exam is baseline for him Extremities - no pedal edema noted, intact peripheral pulses  Skin - warm, dry     Data Review:    CBC Recent Labs  Lab 08/06/20 1035  WBC 8.1  HGB 13.5  HCT 41.6  PLT 221  MCV 91.2  MCH 29.6  MCHC 32.5  RDW 12.7  LYMPHSABS 1.1  MONOABS 0.6  EOSABS 0.1  BASOSABS 0.0   ------------------------------------------------------------------------------------------------------------------  Chemistries  Recent Labs  Lab 08/06/20 1035  NA 135  K 4.3  CL 102  CO2 24  GLUCOSE 104*  BUN 28*  CREATININE 1.44*  CALCIUM 9.3  AST 19  ALT 16  ALKPHOS 81  BILITOT 1.3*   ------------------------------------------------------------------------------------------------------------------ estimated creatinine clearance is 41.6 mL/min (A) (by C-G formula based on SCr of 1.44 mg/dL (H)). ------------------------------------------------------------------------------------------------------------------ No results for input(s): TSH, T4TOTAL, T3FREE, THYROIDAB in the last 72 hours.  Invalid input(s): FREET3   Coagulation profile Recent Labs  Lab 08/06/20 1035  INR 1.1   ------------------------------------------------------------------------------------------------------------------- No results for input(s): DDIMER in the last 72 hours. -------------------------------------------------------------------------------------------------------------------  Cardiac Enzymes No results for input(s): CKMB, TROPONINI, MYOGLOBIN in the last 168 hours.  Invalid input(s): CK ------------------------------------------------------------------------------------------------------------------ No results found for:  BNP   ---------------------------------------------------------------------------------------------------------------  Urinalysis    Component Value Date/Time   COLORURINE YELLOW 08/06/2020 1002   APPEARANCEUR CLOUDY (A) 08/06/2020 1002   LABSPEC 1.011 08/06/2020 1002   PHURINE 7.0 08/06/2020 1002   [place] 08/06/2020 1002   HGBUR SMALL (A) 08/06/2020 1002   [place] 08/06/2020 1002   [place] 08/06/2020 1002   PROTEINUR 30 (A) 08/06/2020 1002   NITRITE POSITIVE (A) 08/06/2020 1002   LEUKOCYTESUR LARGE (A) 08/06/2020 1002    ----------------------------------------------------------------------------------------------------------------   Imaging Results:    CT Angio Head W or Wo Contrast  Result Date: 08/06/2020 CLINICAL DATA:  Neuro deficit, acute, stroke suspected. Additional history provided: Sudden onset of dizziness earlier today. EXAM: CT ANGIOGRAPHY HEAD AND NECK TECHNIQUE: Multidetector CT imaging of the head and neck was performed using the standard protocol during bolus administration of intravenous contrast.  Multiplanar CT image reconstructions and MIPs were obtained to evaluate the vascular anatomy. Carotid stenosis measurements (when applicable) are obtained utilizing NASCET criteria, using the distal internal carotid diameter as the denominator. CONTRAST:  156mL OMNIPAQUE IOHEXOL 350 MG/ML SOLN COMPARISON:  Noncontrast head CT 08/06/2020. FINDINGS: CTA NECK FINDINGS Aortic arch: Standard aortic branching. Atherosclerotic plaque within the visualized aortic arch and proximal major branch vessels of the neck. No hemodynamically significant innominate or proximal subclavian artery stenosis. Right carotid system: CCA and ICA patent within the neck without significant stenosis (50% or greater). Mild calcified plaque within the carotid bifurcation. Left carotid system: CCA and ICA patent within the neck without significant stenosis (50% or greater).  Mild calcified plaque within the carotid bifurcation. Vertebral arteries: Codominant and patent within the neck bilaterally. Mixed plaque results in moderate/severe atherosclerotic narrowing at the origin of the right vertebral artery. Mild nonstenotic plaque at the origin of the left vertebral artery. Skeleton: No acute bony abnormality or aggressive osseous lesion. C3-C4 grade 1 anterolisthesis. Cervical spondylosis with multilevel disc space narrowing, disc bulges, posterior disc osteophytes, uncovertebral hypertrophy and facet arthrosis. Disc space narrowing is severe at C4-C5, C5-C6 and C6-C7. Other neck: No neck mass or cervical lymphadenopathy. Thyroid unremarkable. Upper chest: No consolidation within the imaged lung apices. Centrilobular and paraseptal emphysema. Review of the MIP images confirms the above findings CTA HEAD FINDINGS Anterior circulation: The intracranial internal carotid arteries are patent. Calcified plaque within both vessels without stenosis. The M1 middle cerebral arteries are patent without significant stenosis. No M2 proximal branch occlusion or high-grade proximal stenosis is identified. The anterior cerebral arteries are patent. 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 Posterior circulation: The intracranial vertebral arteries are patent. The basilar artery is patent. The posterior cerebral arteries are patent. A left posterior communicating artery is present. The right posterior communicating artery is hypoplastic or absent. Venous sinuses: Within limitations of contrast timing, no convincing thrombus. Anatomic variants: As described Review of the MIP images confirms the above findings IMPRESSION: CTA neck: 1. The common and internal carotid arteries are patent within the neck without significant stenosis. Mild calcified plaque within the carotid bifurcations bilaterally. 2. The vertebral arteries are codominant and patent within  the neck bilaterally. Moderate/severe atherosclerotic narrowing at the origin of the right vertebral artery. CTA head: 1. No intracranial large vessel occlusion or proximal high-grade arterial stenosis. 2. Non-stenotic calcified plaque within the intracranial internal carotid arteries. 3. 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 Electronically Signed   By: Kellie Simmering DO   On: 08/06/2020 13:19   CT HEAD WO CONTRAST  Result Date: 08/06/2020 CLINICAL DATA:  Dizziness EXAM: CT HEAD WITHOUT CONTRAST TECHNIQUE: Contiguous axial images were obtained from the base of the skull through the vertex without intravenous contrast. COMPARISON:  None. FINDINGS: Brain: There is moderate diffuse atrophy. There is no intracranial mass, hemorrhage, extra-axial fluid collection, or midline shift. There is patchy small vessel disease in the centra semiovale bilaterally. No acute appearing infarct is evident. Vascular: No hyperdense vessel. There is calcification in each carotid siphon region. Skull: Bony calvarium appears intact. Sinuses/Orbits: There is mucosal thickening in multiple ethmoid air cells. Other visualized paranasal sinuses are clear. Orbits appear symmetric bilaterally. Other: Mastoid air cells are clear. IMPRESSION: Atrophy with periventricular small vessel disease. No evident acute infarct. No mass or hemorrhage. There are foci of arterial vascular calcification. There is mucosal thickening in multiple ethmoid air  cells. Electronically Signed   By: Lowella Grip III M.D.   On: 08/06/2020 11:12   CT Angio Neck W and/or Wo Contrast  Result Date: 08/06/2020 CLINICAL DATA:  Neuro deficit, acute, stroke suspected. Additional history provided: Sudden onset of dizziness earlier today. EXAM: CT ANGIOGRAPHY HEAD AND NECK TECHNIQUE: Multidetector CT imaging of the head and neck was performed using the standard protocol during bolus administration of intravenous contrast. Multiplanar  CT image reconstructions and MIPs were obtained to evaluate the vascular anatomy. Carotid stenosis measurements (when applicable) are obtained utilizing NASCET criteria, using the distal internal carotid diameter as the denominator. CONTRAST:  118mL OMNIPAQUE IOHEXOL 350 MG/ML SOLN COMPARISON:  Noncontrast head CT 08/06/2020. FINDINGS: CTA NECK FINDINGS Aortic arch: Standard aortic branching. Atherosclerotic plaque within the visualized aortic arch and proximal major branch vessels of the neck. No hemodynamically significant innominate or proximal subclavian artery stenosis. Right carotid system: CCA and ICA patent within the neck without significant stenosis (50% or greater). Mild calcified plaque within the carotid bifurcation. Left carotid system: CCA and ICA patent within the neck without significant stenosis (50% or greater). Mild calcified plaque within the carotid bifurcation. Vertebral arteries: Codominant and patent within the neck bilaterally. Mixed plaque results in moderate/severe atherosclerotic narrowing at the origin of the right vertebral artery. Mild nonstenotic plaque at the origin of the left vertebral artery. Skeleton: No acute bony abnormality or aggressive osseous lesion. C3-C4 grade 1 anterolisthesis. Cervical spondylosis with multilevel disc space narrowing, disc bulges, posterior disc osteophytes, uncovertebral hypertrophy and facet arthrosis. Disc space narrowing is severe at C4-C5, C5-C6 and C6-C7. Other neck: No neck mass or cervical lymphadenopathy. Thyroid unremarkable. Upper chest: No consolidation within the imaged lung apices. Centrilobular and paraseptal emphysema. Review of the MIP images confirms the above findings CTA HEAD FINDINGS Anterior circulation: The intracranial internal carotid arteries are patent. Calcified plaque within both vessels without stenosis. The M1 middle cerebral arteries are patent without significant stenosis. No M2 proximal branch occlusion or high-grade  proximal stenosis is identified. The anterior cerebral arteries are patent. 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 Posterior circulation: The intracranial vertebral arteries are patent. The basilar artery is patent. The posterior cerebral arteries are patent. A left posterior communicating artery is present. The right posterior communicating artery is hypoplastic or absent. Venous sinuses: Within limitations of contrast timing, no convincing thrombus. Anatomic variants: As described Review of the MIP images confirms the above findings IMPRESSION: CTA neck: 1. The common and internal carotid arteries are patent within the neck without significant stenosis. Mild calcified plaque within the carotid bifurcations bilaterally. 2. The vertebral arteries are codominant and patent within the neck bilaterally. Moderate/severe atherosclerotic narrowing at the origin of the right vertebral artery. CTA head: 1. No intracranial large vessel occlusion or proximal high-grade arterial stenosis. 2. Non-stenotic calcified plaque within the intracranial internal carotid arteries. 3. 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 Electronically Signed   By: Kellie Simmering DO   On: 08/06/2020 13:19   MR BRAIN WO CONTRAST  Result Date: 08/06/2020 CLINICAL DATA:  Dizziness, nonspecific. EXAM: MRI HEAD WITHOUT CONTRAST TECHNIQUE: Multiplanar, multiecho pulse sequences of the brain and surrounding structures were obtained without intravenous contrast. COMPARISON:  08/06/2020 noncontrast head CT and CTA head/neck. FINDINGS: Brain: No diffusion-weighted signal abnormality. No intracranial hemorrhage. No midline shift, ventriculomegaly or extra-axial fluid collection. No mass lesion. Mild cerebral atrophy with ex vacuo dilatation. Moderate chronic microvascular ischemic  changes. Remote right basal ganglia lacunar insult. Vascular: Please see recent CTA head.  Skull and upper cervical spine: Normal marrow signal. Sinuses/Orbits: Normal orbits. Mild ethmoid and right maxillary sinus mucosal thickening. Right mastoid effusion. Other: None. IMPRESSION: No acute intracranial process. Remote right basal ganglia lacunar insult. Mild cerebral atrophy. Moderate chronic microvascular ischemic changes. Electronically Signed   By: Primitivo Gauze M.D.   On: 08/06/2020 14:09   ECHOCARDIOGRAM COMPLETE  Result Date: 08/06/2020    ECHOCARDIOGRAM REPORT   Patient Name:   KC SUMMERSON Date of Exam: 08/06/2020 Medical Rec #:  [id number]     Height:       68.0 in Accession #:    [id number]    Weight:       165.0 lb Date of Birth:  [birth year]     BSA:          1.883 m Patient Age:    85 years      BP:           147/91 mmHg Patient Gender: M             HR:           72 bpm. Exam Location:  [facility] Procedure: 2D Echo, Cardiac Doppler and Color Doppler Indications:    TIA  History:        Patient has no prior history of Echocardiogram examinations.                 Risk Factors:Dyslipidemia. Bladder cancer.  Sonographer:    Dustin Flock RDCS Referring Phys: [place]  1. Left ventricular ejection fraction, by estimation, is 55 to 60%. The left ventricle has normal function. The left ventricle has no regional wall motion abnormalities. There is mild left ventricular hypertrophy. Left ventricular diastolic parameters are consistent with Grade I diastolic dysfunction (impaired relaxation).  2. Right ventricular systolic function is normal. The right ventricular size is normal. There is normal pulmonary artery systolic pressure.  3. The mitral valve is normal in structure. No evidence of mitral valve regurgitation. No evidence of mitral stenosis.  4. The aortic valve has an indeterminant number of cusps. There is mild calcification of the aortic valve. There is mild thickening of the aortic valve. Aortic valve regurgitation is not visualized. No aortic  stenosis is present.  5. The inferior vena cava is normal in size with greater than 50% respiratory variability, suggesting right atrial pressure of 3 mmHg. FINDINGS  Left Ventricle: Left ventricular ejection fraction, by estimation, is 55 to 60%. The left ventricle has normal function. The left ventricle has no regional wall motion abnormalities. The left ventricular internal cavity size was normal in size. There is  mild left ventricular hypertrophy. Left ventricular diastolic parameters are consistent with Grade I diastolic dysfunction (impaired relaxation). Normal left ventricular filling pressure. Right Ventricle: The right ventricular size is normal. No increase in right ventricular wall thickness. Right ventricular systolic function is normal. There is normal pulmonary artery systolic pressure. The tricuspid regurgitant velocity is 2.01 m/s, and  with an assumed right atrial pressure of 3 mmHg, the estimated right ventricular systolic pressure is 00.9 mmHg. Left Atrium: Left atrial size was normal in size. Right Atrium: Right atrial size was normal in size. Pericardium: There is no evidence of pericardial effusion. Mitral Valve: The mitral valve is normal in structure. No evidence of mitral valve regurgitation. No evidence of mitral valve stenosis. Tricuspid Valve: The tricuspid valve is normal in structure.  Tricuspid valve regurgitation is trivial. No evidence of tricuspid stenosis. Aortic Valve: The aortic valve has an indeterminant number of cusps. There is mild calcification of the aortic valve. There is mild thickening of the aortic valve. There is mild aortic valve annular calcification. Aortic valve regurgitation is not visualized. No aortic stenosis is present. Aortic valve mean gradient measures 2.3 mmHg. Aortic valve peak gradient measures 3.8 mmHg. Aortic valve area, by VTI measures 4.17 cm. Pulmonic Valve: The pulmonic valve was not well visualized. Pulmonic valve regurgitation is not visualized.  No evidence of pulmonic stenosis. Aorta: The aortic root is normal in size and structure. Pulmonary Artery: Indeterminant PASP, inadequate TR jet. Venous: The inferior vena cava is normal in size with greater than 50% respiratory variability, suggesting right atrial pressure of 3 mmHg. IAS/Shunts: No atrial level shunt detected by color flow Doppler.  LEFT VENTRICLE PLAX 2D LVIDd:         4.10 cm  Diastology LVIDs:         3.01 cm  LV e' medial:    5.55 cm/s LV PW:         1.24 cm  LV E/e' medial:  9.0 LV IVS:        1.11 cm  LV e' lateral:   6.96 cm/s LVOT diam:     2.30 cm  LV E/e' lateral: 7.1 LV SV:         68 LV SV Index:   36 LVOT Area:     4.15 cm  RIGHT VENTRICLE RV Basal diam:  3.20 cm RV S prime:     9.14 cm/s TAPSE (M-mode): 3.0 cm LEFT ATRIUM             Index       RIGHT ATRIUM           Index LA diam:        3.80 cm 2.02 cm/m  RA Area:     12.60 cm LA Vol (A2C):   33.2 ml 17.63 ml/m RA Volume:   31.10 ml  16.51 ml/m LA Vol (A4C):   31.1 ml 16.51 ml/m LA Biplane Vol: 33.5 ml 17.79 ml/m  AORTIC VALVE AV Area (Vmax):    3.80 cm AV Area (Vmean):   3.11 cm AV Area (VTI):     4.17 cm AV Vmax:           96.96 cm/s AV Vmean:          73.651 cm/s AV VTI:            0.163 m AV Peak Grad:      3.8 mmHg AV Mean Grad:      2.3 mmHg LVOT Vmax:         88.70 cm/s LVOT Vmean:        55.100 cm/s LVOT VTI:          0.164 m LVOT/AV VTI ratio: 1.00  AORTA Ao Root diam: 3.40 cm MITRAL VALVE               TRICUSPID VALVE MV Area (PHT): 5.20 cm    TR Peak grad:   16.2 mmHg MV Decel Time: 146 msec    TR Vmax:        201.00 cm/s MV E velocity: 49.70 cm/s MV A velocity: 66.40 cm/s  SHUNTS MV E/A ratio:  0.75        Systemic VTI:  0.16 m  Systemic Diam: 2.30 cm Carlyle Dolly MD Electronically signed by Carlyle Dolly MD Signature Date/Time: 08/06/2020/4:51:00 PM    Final     Radiological Exams on Admission: CT Angio Head W or Wo Contrast  Result Date: 08/06/2020 CLINICAL DATA:   Neuro deficit, acute, stroke suspected. Additional history provided: Sudden onset of dizziness earlier today. EXAM: CT ANGIOGRAPHY HEAD AND NECK TECHNIQUE: Multidetector CT imaging of the head and neck was performed using the standard protocol during bolus administration of intravenous contrast. Multiplanar CT image reconstructions and MIPs were obtained to evaluate the vascular anatomy. Carotid stenosis measurements (when applicable) are obtained utilizing NASCET criteria, using the distal internal carotid diameter as the denominator. CONTRAST:  155mL OMNIPAQUE IOHEXOL 350 MG/ML SOLN COMPARISON:  Noncontrast head CT 08/06/2020. FINDINGS: CTA NECK FINDINGS Aortic arch: Standard aortic branching. Atherosclerotic plaque within the visualized aortic arch and proximal major branch vessels of the neck. No hemodynamically significant innominate or proximal subclavian artery stenosis. Right carotid system: CCA and ICA patent within the neck without significant stenosis (50% or greater). Mild calcified plaque within the carotid bifurcation. Left carotid system: CCA and ICA patent within the neck without significant stenosis (50% or greater). Mild calcified plaque within the carotid bifurcation. Vertebral arteries: Codominant and patent within the neck bilaterally. Mixed plaque results in moderate/severe atherosclerotic narrowing at the origin of the right vertebral artery. Mild nonstenotic plaque at the origin of the left vertebral artery. Skeleton: No acute bony abnormality or aggressive osseous lesion. C3-C4 grade 1 anterolisthesis. Cervical spondylosis with multilevel disc space narrowing, disc bulges, posterior disc osteophytes, uncovertebral hypertrophy and facet arthrosis. Disc space narrowing is severe at C4-C5, C5-C6 and C6-C7. Other neck: No neck mass or cervical lymphadenopathy. Thyroid unremarkable. Upper chest: No consolidation within the imaged lung apices. Centrilobular and paraseptal emphysema. Review of the  MIP images confirms the above findings CTA HEAD FINDINGS Anterior circulation: The intracranial internal carotid arteries are patent. Calcified plaque within both vessels without stenosis. The M1 middle cerebral arteries are patent without significant stenosis. No M2 proximal branch occlusion or high-grade proximal stenosis is identified. The anterior cerebral arteries are patent. 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 Posterior circulation: The intracranial vertebral arteries are patent. The basilar artery is patent. The posterior cerebral arteries are patent. A left posterior communicating artery is present. The right posterior communicating artery is hypoplastic or absent. Venous sinuses: Within limitations of contrast timing, no convincing thrombus. Anatomic variants: As described Review of the MIP images confirms the above findings IMPRESSION: CTA neck: 1. The common and internal carotid arteries are patent within the neck without significant stenosis. Mild calcified plaque within the carotid bifurcations bilaterally. 2. The vertebral arteries are codominant and patent within the neck bilaterally. Moderate/severe atherosclerotic narrowing at the origin of the right vertebral artery. CTA head: 1. No intracranial large vessel occlusion or proximal high-grade arterial stenosis. 2. Non-stenotic calcified plaque within the intracranial internal carotid arteries. 3. 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 Electronically Signed   By: Kellie Simmering DO   On: 08/06/2020 13:19   CT HEAD WO CONTRAST  Result Date: 08/06/2020 CLINICAL DATA:  Dizziness EXAM: CT HEAD WITHOUT CONTRAST TECHNIQUE: Contiguous axial images were obtained from the base of the skull through the vertex without intravenous contrast. COMPARISON:  None. FINDINGS: Brain: There is moderate diffuse atrophy. There is no intracranial mass, hemorrhage, extra-axial fluid  collection, or midline shift. There is patchy small vessel  disease in the centra semiovale bilaterally. No acute appearing infarct is evident. Vascular: No hyperdense vessel. There is calcification in each carotid siphon region. Skull: Bony calvarium appears intact. Sinuses/Orbits: There is mucosal thickening in multiple ethmoid air cells. Other visualized paranasal sinuses are clear. Orbits appear symmetric bilaterally. Other: Mastoid air cells are clear. IMPRESSION: Atrophy with periventricular small vessel disease. No evident acute infarct. No mass or hemorrhage. There are foci of arterial vascular calcification. There is mucosal thickening in multiple ethmoid air cells. Electronically Signed   By: Lowella Grip III M.D.   On: 08/06/2020 11:12   CT Angio Neck W and/or Wo Contrast  Result Date: 08/06/2020 CLINICAL DATA:  Neuro deficit, acute, stroke suspected. Additional history provided: Sudden onset of dizziness earlier today. EXAM: CT ANGIOGRAPHY HEAD AND NECK TECHNIQUE: Multidetector CT imaging of the head and neck was performed using the standard protocol during bolus administration of intravenous contrast. Multiplanar CT image reconstructions and MIPs were obtained to evaluate the vascular anatomy. Carotid stenosis measurements (when applicable) are obtained utilizing NASCET criteria, using the distal internal carotid diameter as the denominator. CONTRAST:  151mL OMNIPAQUE IOHEXOL 350 MG/ML SOLN COMPARISON:  Noncontrast head CT 08/06/2020. FINDINGS: CTA NECK FINDINGS Aortic arch: Standard aortic branching. Atherosclerotic plaque within the visualized aortic arch and proximal major branch vessels of the neck. No hemodynamically significant innominate or proximal subclavian artery stenosis. Right carotid system: CCA and ICA patent within the neck without significant stenosis (50% or greater). Mild calcified plaque within the carotid bifurcation. Left carotid system: CCA and ICA patent within the neck  without significant stenosis (50% or greater). Mild calcified plaque within the carotid bifurcation. Vertebral arteries: Codominant and patent within the neck bilaterally. Mixed plaque results in moderate/severe atherosclerotic narrowing at the origin of the right vertebral artery. Mild nonstenotic plaque at the origin of the left vertebral artery. Skeleton: No acute bony abnormality or aggressive osseous lesion. C3-C4 grade 1 anterolisthesis. Cervical spondylosis with multilevel disc space narrowing, disc bulges, posterior disc osteophytes, uncovertebral hypertrophy and facet arthrosis. Disc space narrowing is severe at C4-C5, C5-C6 and C6-C7. Other neck: No neck mass or cervical lymphadenopathy. Thyroid unremarkable. Upper chest: No consolidation within the imaged lung apices. Centrilobular and paraseptal emphysema. Review of the MIP images confirms the above findings CTA HEAD FINDINGS Anterior circulation: The intracranial internal carotid arteries are patent. Calcified plaque within both vessels without stenosis. The M1 middle cerebral arteries are patent without significant stenosis. No M2 proximal branch occlusion or high-grade proximal stenosis is identified. The anterior cerebral arteries are patent. 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 Posterior circulation: The intracranial vertebral arteries are patent. The basilar artery is patent. The posterior cerebral arteries are patent. A left posterior communicating artery is present. The right posterior communicating artery is hypoplastic or absent. Venous sinuses: Within limitations of contrast timing, no convincing thrombus. Anatomic variants: As described Review of the MIP images confirms the above findings IMPRESSION: CTA neck: 1. The common and internal carotid arteries are patent within the neck without significant stenosis. Mild calcified plaque within the carotid bifurcations bilaterally. 2. The  vertebral arteries are codominant and patent within the neck bilaterally. Moderate/severe atherosclerotic narrowing at the origin of the right vertebral artery. CTA head: 1. No intracranial large vessel occlusion or proximal high-grade arterial stenosis. 2. Non-stenotic calcified plaque within the intracranial internal carotid arteries. 3. 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 Electronically Signed   By:  Kellie Simmering DO   On: 08/06/2020 13:19   MR BRAIN WO CONTRAST  Result Date: 08/06/2020 CLINICAL DATA:  Dizziness, nonspecific. EXAM: MRI HEAD WITHOUT CONTRAST TECHNIQUE: Multiplanar, multiecho pulse sequences of the brain and surrounding structures were obtained without intravenous contrast. COMPARISON:  08/06/2020 noncontrast head CT and CTA head/neck. FINDINGS: Brain: No diffusion-weighted signal abnormality. No intracranial hemorrhage. No midline shift, ventriculomegaly or extra-axial fluid collection. No mass lesion. Mild cerebral atrophy with ex vacuo dilatation. Moderate chronic microvascular ischemic changes. Remote right basal ganglia lacunar insult. Vascular: Please see recent CTA head. Skull and upper cervical spine: Normal marrow signal. Sinuses/Orbits: Normal orbits. Mild ethmoid and right maxillary sinus mucosal thickening. Right mastoid effusion. Other: None. IMPRESSION: No acute intracranial process. Remote right basal ganglia lacunar insult. Mild cerebral atrophy. Moderate chronic microvascular ischemic changes. Electronically Signed   By: Primitivo Gauze M.D.   On: 08/06/2020 14:09   ECHOCARDIOGRAM COMPLETE  Result Date: 08/06/2020    ECHOCARDIOGRAM REPORT   Patient Name:   SAHAS SLUKA Date of Exam: 08/06/2020 Medical Rec #:  [id number]     Height:       68.0 in Accession #:    [id number]    Weight:       165.0 lb Date of Birth:  1941/08/18     BSA:          1.883 m Patient Age:    22 years      BP:           147/91 mmHg Patient Gender: M              HR:           72 bpm. Exam Location:  [facility] Procedure: 2D Echo, Cardiac Doppler and Color Doppler Indications:    TIA  History:        Patient has no prior history of Echocardiogram examinations.                 Risk Factors:Dyslipidemia. Bladder cancer.  Sonographer:    Dustin Flock RDCS Referring Phys: [place]  1. Left ventricular ejection fraction, by estimation, is 55 to 60%. The left ventricle has normal function. The left ventricle has no regional wall motion abnormalities. There is mild left ventricular hypertrophy. Left ventricular diastolic parameters are consistent with Grade I diastolic dysfunction (impaired relaxation).  2. Right ventricular systolic function is normal. The right ventricular size is normal. There is normal pulmonary artery systolic pressure.  3. The mitral valve is normal in structure. No evidence of mitral valve regurgitation. No evidence of mitral stenosis.  4. The aortic valve has an indeterminant number of cusps. There is mild calcification of the aortic valve. There is mild thickening of the aortic valve. Aortic valve regurgitation is not visualized. No aortic stenosis is present.  5. The inferior vena cava is normal in size with greater than 50% respiratory variability, suggesting right atrial pressure of 3 mmHg. FINDINGS  Left Ventricle: Left ventricular ejection fraction, by estimation, is 55 to 60%. The left ventricle has normal function. The left ventricle has no regional wall motion abnormalities. The left ventricular internal cavity size was normal in size. There is  mild left ventricular hypertrophy. Left ventricular diastolic parameters are consistent with Grade I diastolic dysfunction (impaired relaxation). Normal left ventricular filling pressure. Right Ventricle: The right ventricular size is normal. No increase in right ventricular wall thickness. Right ventricular systolic function is normal. There is normal pulmonary artery  systolic pressure. The tricuspid regurgitant velocity is 2.01 m/s, and  with an assumed right atrial pressure of 3 mmHg, the estimated right ventricular systolic pressure is 01.7 mmHg. Left Atrium: Left atrial size was normal in size. Right Atrium: Right atrial size was normal in size. Pericardium: There is no evidence of pericardial effusion. Mitral Valve: The mitral valve is normal in structure. No evidence of mitral valve regurgitation. No evidence of mitral valve stenosis. Tricuspid Valve: The tricuspid valve is normal in structure. Tricuspid valve regurgitation is trivial. No evidence of tricuspid stenosis. Aortic Valve: The aortic valve has an indeterminant number of cusps. There is mild calcification of the aortic valve. There is mild thickening of the aortic valve. There is mild aortic valve annular calcification. Aortic valve regurgitation is not visualized. No aortic stenosis is present. Aortic valve mean gradient measures 2.3 mmHg. Aortic valve peak gradient measures 3.8 mmHg. Aortic valve area, by VTI measures 4.17 cm. Pulmonic Valve: The pulmonic valve was not well visualized. Pulmonic valve regurgitation is not visualized. No evidence of pulmonic stenosis. Aorta: The aortic root is normal in size and structure. Pulmonary Artery: Indeterminant PASP, inadequate TR jet. Venous: The inferior vena cava is normal in size with greater than 50% respiratory variability, suggesting right atrial pressure of 3 mmHg. IAS/Shunts: No atrial level shunt detected by color flow Doppler.  LEFT VENTRICLE PLAX 2D LVIDd:         4.10 cm  Diastology LVIDs:         3.01 cm  LV e' medial:    5.55 cm/s LV PW:         1.24 cm  LV E/e' medial:  9.0 LV IVS:        1.11 cm  LV e' lateral:   6.96 cm/s LVOT diam:     2.30 cm  LV E/e' lateral: 7.1 LV SV:         68 LV SV Index:   36 LVOT Area:     4.15 cm  RIGHT VENTRICLE RV Basal diam:  3.20 cm RV S prime:     9.14 cm/s TAPSE (M-mode): 3.0 cm LEFT ATRIUM             Index        RIGHT ATRIUM           Index LA diam:        3.80 cm 2.02 cm/m  RA Area:     12.60 cm LA Vol (A2C):   33.2 ml 17.63 ml/m RA Volume:   31.10 ml  16.51 ml/m LA Vol (A4C):   31.1 ml 16.51 ml/m LA Biplane Vol: 33.5 ml 17.79 ml/m  AORTIC VALVE AV Area (Vmax):    3.80 cm AV Area (Vmean):   3.11 cm AV Area (VTI):     4.17 cm AV Vmax:           96.96 cm/s AV Vmean:          73.651 cm/s AV VTI:            0.163 m AV Peak Grad:      3.8 mmHg AV Mean Grad:      2.3 mmHg LVOT Vmax:         88.70 cm/s LVOT Vmean:        55.100 cm/s LVOT VTI:          0.164 m LVOT/AV VTI ratio: 1.00  AORTA Ao Root diam: 3.40 cm MITRAL VALVE  TRICUSPID VALVE MV Area (PHT): 5.20 cm    TR Peak grad:   16.2 mmHg MV Decel Time: 146 msec    TR Vmax:        201.00 cm/s MV E velocity: 49.70 cm/s MV A velocity: 66.40 cm/s  SHUNTS MV E/A ratio:  0.75        Systemic VTI:  0.16 m                            Systemic Diam: 2.30 cm Carlyle Dolly MD Electronically signed by Carlyle Dolly MD Signature Date/Time: 08/06/2020/4:51:00 PM    Final    Family Communication: Admission, patients condition and plan of care including tests being ordered have been discussed with the patient and wife at bedside who indicate understanding and agree with the plan   Code Status - Full Code  Likely DC to discharge home with outpatient neurology follow-up  Condition-  stable  Roxan Hockey M.D on 08/06/2020 at 5:45 PM Go to [email or web address] -  for contact info  [facility] - Office  [phone]

## 2020-08-06 NOTE — Discharge Instructions (Signed)
Transient Ischemic Attack  A transient ischemic attack (TIA) is a "warning stroke" that causes stroke-like symptoms that go away quickly. A TIA does not cause lasting damage to the brain. But having a TIA is a sign that you may be at risk for a stroke. Lifestyle changes and medical treatments can help prevent a stroke. It is important to know the symptoms of a TIA and what to do. Get help right away, even if your symptoms go away. The symptoms of a TIA are the same as those of a stroke. They can happen fast, and they usually go away within minutes or hours. They can include:  Weakness or loss of feeling in your face, arm, or leg. This often happens on one side of your body.  Trouble walking.  Trouble moving your arms or legs.  Trouble talking or understanding what people are saying.  Trouble seeing.  Seeing two of one object (double vision).  Feeling dizzy.  Feeling confused.  Loss of balance or coordination.  Feeling sick to your stomach (nauseous) and throwing up (vomiting).  A very bad headache for no reason. What increases the risk? Certain things may make you more likely to have a TIA. Some of these are things that you can change, such as:  Being very overweight (obese).  Using products that contain nicotine or tobacco, such as cigarettes and e-cigarettes.  Taking birth control pills.  Not being active.  Drinking too much alcohol.  Using drugs. Other risk factors include:  Having an irregular heartbeat (atrial fibrillation).  Being African American or Hispanic.  Having had blood clots, stroke, TIA, or heart attack in the past.  Being a woman with a history of high blood pressure in pregnancy (preeclampsia).  Being over the age of 59.  Being male.  Having family history of stroke.  Having the following diseases or conditions: ? High blood pressure. ? High cholesterol. ? Diabetes. ? Heart disease. ? Sickle cell disease. ? Sleep apnea. ? Migraine  headache. ? Long-term (chronic) diseases that cause soreness and swelling (inflammation). ? Disorders that affect how your blood clots. Follow these instructions at home: Medicines   Take over-the-counter and prescription medicines only as told by your doctor.  If you were told to take aspirin or another medicine to thin your blood, take it exactly as told by your doctor. ? Taking too much of the medicine can cause bleeding. ? Taking too little of the medicine may not work to treat the problem. Eating and drinking   Eat 5 or more servings of fruits and vegetables each day.  Follow instructions from your doctor about your diet. You may need to follow a certain diet to help lower your risk of having a stroke. You may need to: ? Eat a diet that is low in fat and salt. ? Eat foods that contain a lot of fiber. ? Limit the amount of carbohydrates and sugar in your diet.  Limit alcohol intake to 1 drink a day for nonpregnant women and 2 drinks a day for men. One drink equals 12 oz of beer, 5 oz of wine, or 1 oz of hard liquor. General instructions  Keep a healthy weight.  Stay active. Try to get at least 30 minutes of activity on all or most days.  Find out if you have a condition called sleep apnea. Get treatment if needed.  Do not use any products that contain nicotine or tobacco, such as cigarettes and e-cigarettes. If you need help  quitting, ask your doctor.  Do not abuse drugs.  Keep all follow-up visits as told by your doctor. This is important. Get help right away if:  You have any signs of stroke. "BE FAST" is an easy way to remember the main warning signs: ? B - Balance. Signs are dizziness, sudden trouble walking, or loss of balance. ? E - Eyes. Signs are trouble seeing or a sudden change in how you see. ? F - Face. Signs are sudden weakness or loss of feeling of the face, or the face or eyelid drooping on one side. ? A - Arms. Signs are weakness or loss of feeling in an  arm. This happens suddenly and usually on one side of the body. ? S - Speech. Signs are sudden trouble speaking, slurred speech, or trouble understanding what people say. ? T - Time. Time to call emergency services. Write down what time symptoms started.  You have other signs of stroke, such as: ? A sudden, very bad headache with no known cause. ? Feeling sick to your stomach (nausea). ? Throwing up (vomiting). ? Jerky movements that you cannot control (seizure). These symptoms may be an emergency. Do not wait to see if the symptoms will go away. Get medical help right away. Call your local emergency services (911 in the [place] Do not drive yourself to the hospital. Summary  A transient ischemic attack (TIA) is a "warning stroke" that causes stroke-like symptoms that go away quickly.  A TIA is a medical emergency. Get help right away, even if your symptoms go away.  A TIA does not cause lasting damage to the brain.  Having a TIA is a sign that you may be at risk for a stroke. Lifestyle changes and medical treatments can help prevent a stroke. This information is not intended to replace advice given to you by your health care provider. Make sure you discuss any questions you have with your health care provider. Document Revised: 06/25/2018 Document Reviewed: 12/31/2016 Elsevier Patient Education  2020 [employer]. 1)Please follow-up with Neurologist Dr. Phillips Odor-- Phone: [phone], Address: [address], [place], [place] [address] in 4 to 6 weeks for recheck and reevaluation.  Please call to make appointment with him  2) please follow-up with Dr. Nevada Crane if you develop any urinary tract infection type symptoms-including discomfort/pain, unusual smell to urine, nausea, fever or chills  3)Even though your cholesterol/lipid panel is within desired limits, patient should still take Lipitor/Statin for it's Pleiotropic effects (beyond cholesterol lowering benefits)--in order to  reduce his stroke risk going forward  4) take a baby aspirin with food daily for secondary prevention of stroke to this episode of TIA/mini stroke,  5) you have underlying chronic kidney disease so Avoid ibuprofen/Advil/Aleve/Motrin/Goody Powders/Naproxen/BC powders/Meloxicam/Diclofenac/Indomethacin and other Nonsteroidal anti-inflammatory medications as these will make you more likely to bleed and can cause stomach ulcers, can also cause Kidney problems.

## 2020-08-06 NOTE — ED Triage Notes (Signed)
Pt c/o of seeing colorful lights and flashing for a few minutes and c/o of episodes of dizziness lasting 15 minutes. Denies these symptoms now at this time.

## 2020-08-07 DIAGNOSIS — Z8551 Personal history of malignant neoplasm of bladder: Secondary | ICD-10-CM | POA: Diagnosis not present

## 2020-08-07 DIAGNOSIS — Z936 Other artificial openings of urinary tract status: Secondary | ICD-10-CM | POA: Diagnosis not present

## 2020-08-08 DIAGNOSIS — N1831 Chronic kidney disease, stage 3a: Secondary | ICD-10-CM | POA: Diagnosis not present

## 2020-08-08 DIAGNOSIS — R7301 Impaired fasting glucose: Secondary | ICD-10-CM | POA: Diagnosis not present

## 2020-08-08 DIAGNOSIS — K219 Gastro-esophageal reflux disease without esophagitis: Secondary | ICD-10-CM | POA: Diagnosis not present

## 2020-08-08 DIAGNOSIS — E782 Mixed hyperlipidemia: Secondary | ICD-10-CM | POA: Diagnosis not present

## 2020-08-08 DIAGNOSIS — Z23 Encounter for immunization: Secondary | ICD-10-CM | POA: Diagnosis not present

## 2020-11-10 DIAGNOSIS — Z712 Person consulting for explanation of examination or test findings: Secondary | ICD-10-CM | POA: Diagnosis not present

## 2020-11-10 DIAGNOSIS — E782 Mixed hyperlipidemia: Secondary | ICD-10-CM | POA: Diagnosis not present

## 2020-11-10 DIAGNOSIS — N1831 Chronic kidney disease, stage 3a: Secondary | ICD-10-CM | POA: Diagnosis not present

## 2020-11-10 DIAGNOSIS — H9113 Presbycusis, bilateral: Secondary | ICD-10-CM | POA: Diagnosis not present

## 2020-11-10 DIAGNOSIS — K219 Gastro-esophageal reflux disease without esophagitis: Secondary | ICD-10-CM | POA: Diagnosis not present

## 2020-11-10 DIAGNOSIS — E875 Hyperkalemia: Secondary | ICD-10-CM | POA: Diagnosis not present

## 2020-11-10 DIAGNOSIS — D649 Anemia, unspecified: Secondary | ICD-10-CM | POA: Diagnosis not present

## 2020-11-10 DIAGNOSIS — G459 Transient cerebral ischemic attack, unspecified: Secondary | ICD-10-CM | POA: Diagnosis not present

## 2020-11-10 DIAGNOSIS — C670 Malignant neoplasm of trigone of bladder: Secondary | ICD-10-CM | POA: Diagnosis not present

## 2020-11-13 DIAGNOSIS — Z712 Person consulting for explanation of examination or test findings: Secondary | ICD-10-CM | POA: Diagnosis not present

## 2020-11-13 DIAGNOSIS — K219 Gastro-esophageal reflux disease without esophagitis: Secondary | ICD-10-CM | POA: Diagnosis not present

## 2020-11-13 DIAGNOSIS — E782 Mixed hyperlipidemia: Secondary | ICD-10-CM | POA: Diagnosis not present

## 2020-11-13 DIAGNOSIS — D649 Anemia, unspecified: Secondary | ICD-10-CM | POA: Diagnosis not present

## 2020-11-13 DIAGNOSIS — E875 Hyperkalemia: Secondary | ICD-10-CM | POA: Diagnosis not present

## 2020-11-13 DIAGNOSIS — C670 Malignant neoplasm of trigone of bladder: Secondary | ICD-10-CM | POA: Diagnosis not present

## 2020-11-13 DIAGNOSIS — H9113 Presbycusis, bilateral: Secondary | ICD-10-CM | POA: Diagnosis not present

## 2020-11-13 DIAGNOSIS — R7301 Impaired fasting glucose: Secondary | ICD-10-CM | POA: Diagnosis not present

## 2020-11-13 DIAGNOSIS — N1831 Chronic kidney disease, stage 3a: Secondary | ICD-10-CM | POA: Diagnosis not present

## 2020-12-11 DIAGNOSIS — Z936 Other artificial openings of urinary tract status: Secondary | ICD-10-CM | POA: Diagnosis not present

## 2020-12-11 DIAGNOSIS — Z8551 Personal history of malignant neoplasm of bladder: Secondary | ICD-10-CM | POA: Diagnosis not present

## 2021-01-09 DIAGNOSIS — D649 Anemia, unspecified: Secondary | ICD-10-CM | POA: Diagnosis not present

## 2021-01-09 DIAGNOSIS — E875 Hyperkalemia: Secondary | ICD-10-CM | POA: Diagnosis not present

## 2021-01-09 DIAGNOSIS — Z712 Person consulting for explanation of examination or test findings: Secondary | ICD-10-CM | POA: Diagnosis not present

## 2021-01-09 DIAGNOSIS — E782 Mixed hyperlipidemia: Secondary | ICD-10-CM | POA: Diagnosis not present

## 2021-01-09 DIAGNOSIS — K219 Gastro-esophageal reflux disease without esophagitis: Secondary | ICD-10-CM | POA: Diagnosis not present

## 2021-01-09 DIAGNOSIS — C670 Malignant neoplasm of trigone of bladder: Secondary | ICD-10-CM | POA: Diagnosis not present

## 2021-01-09 DIAGNOSIS — N1831 Chronic kidney disease, stage 3a: Secondary | ICD-10-CM | POA: Diagnosis not present

## 2021-01-21 DIAGNOSIS — R7301 Impaired fasting glucose: Secondary | ICD-10-CM | POA: Diagnosis not present

## 2021-01-21 DIAGNOSIS — E782 Mixed hyperlipidemia: Secondary | ICD-10-CM | POA: Diagnosis not present

## 2021-01-21 DIAGNOSIS — N183 Chronic kidney disease, stage 3 unspecified: Secondary | ICD-10-CM | POA: Diagnosis not present

## 2021-01-21 DIAGNOSIS — D649 Anemia, unspecified: Secondary | ICD-10-CM | POA: Diagnosis not present

## 2021-01-21 DIAGNOSIS — C679 Malignant neoplasm of bladder, unspecified: Secondary | ICD-10-CM | POA: Diagnosis not present

## 2021-01-21 DIAGNOSIS — C670 Malignant neoplasm of trigone of bladder: Secondary | ICD-10-CM | POA: Diagnosis not present

## 2021-01-24 DIAGNOSIS — E782 Mixed hyperlipidemia: Secondary | ICD-10-CM | POA: Diagnosis not present

## 2021-01-24 DIAGNOSIS — N1831 Chronic kidney disease, stage 3a: Secondary | ICD-10-CM | POA: Diagnosis not present

## 2021-01-24 DIAGNOSIS — D649 Anemia, unspecified: Secondary | ICD-10-CM | POA: Diagnosis not present

## 2021-01-24 DIAGNOSIS — Z712 Person consulting for explanation of examination or test findings: Secondary | ICD-10-CM | POA: Diagnosis not present

## 2021-01-24 DIAGNOSIS — K219 Gastro-esophageal reflux disease without esophagitis: Secondary | ICD-10-CM | POA: Diagnosis not present

## 2021-01-24 DIAGNOSIS — R7301 Impaired fasting glucose: Secondary | ICD-10-CM | POA: Diagnosis not present

## 2021-01-24 DIAGNOSIS — C670 Malignant neoplasm of trigone of bladder: Secondary | ICD-10-CM | POA: Diagnosis not present

## 2021-01-24 DIAGNOSIS — E875 Hyperkalemia: Secondary | ICD-10-CM | POA: Diagnosis not present

## 2021-01-24 DIAGNOSIS — H9113 Presbycusis, bilateral: Secondary | ICD-10-CM | POA: Diagnosis not present

## 2021-01-24 DIAGNOSIS — L989 Disorder of the skin and subcutaneous tissue, unspecified: Secondary | ICD-10-CM | POA: Diagnosis not present

## 2021-02-10 DIAGNOSIS — K219 Gastro-esophageal reflux disease without esophagitis: Secondary | ICD-10-CM | POA: Diagnosis not present

## 2021-02-10 DIAGNOSIS — D649 Anemia, unspecified: Secondary | ICD-10-CM | POA: Diagnosis not present

## 2021-02-18 DIAGNOSIS — Z936 Other artificial openings of urinary tract status: Secondary | ICD-10-CM | POA: Diagnosis not present

## 2021-02-18 DIAGNOSIS — Z8551 Personal history of malignant neoplasm of bladder: Secondary | ICD-10-CM | POA: Diagnosis not present

## 2021-04-03 DIAGNOSIS — D649 Anemia, unspecified: Secondary | ICD-10-CM | POA: Diagnosis not present

## 2021-04-03 DIAGNOSIS — K219 Gastro-esophageal reflux disease without esophagitis: Secondary | ICD-10-CM | POA: Diagnosis not present

## 2021-04-25 DIAGNOSIS — D225 Melanocytic nevi of trunk: Secondary | ICD-10-CM | POA: Diagnosis not present

## 2021-04-25 DIAGNOSIS — D485 Neoplasm of uncertain behavior of skin: Secondary | ICD-10-CM | POA: Diagnosis not present

## 2021-04-25 DIAGNOSIS — L821 Other seborrheic keratosis: Secondary | ICD-10-CM | POA: Diagnosis not present

## 2021-05-16 DIAGNOSIS — Z8551 Personal history of malignant neoplasm of bladder: Secondary | ICD-10-CM | POA: Diagnosis not present

## 2021-05-16 DIAGNOSIS — Z936 Other artificial openings of urinary tract status: Secondary | ICD-10-CM | POA: Diagnosis not present

## 2021-06-12 DIAGNOSIS — I10 Essential (primary) hypertension: Secondary | ICD-10-CM | POA: Diagnosis not present

## 2021-06-12 DIAGNOSIS — E785 Hyperlipidemia, unspecified: Secondary | ICD-10-CM | POA: Diagnosis not present

## 2021-06-19 ENCOUNTER — Other Ambulatory Visit (HOSPITAL_COMMUNITY): Payer: Self-pay | Admitting: Urology

## 2021-06-19 ENCOUNTER — Ambulatory Visit (HOSPITAL_COMMUNITY)
Admission: RE | Admit: 2021-06-19 | Discharge: 2021-06-19 | Disposition: A | Discharge status: Home / Self Care | Payer: Medicare Other | Source: Ambulatory Visit | Attending: Urology | Admitting: Urology

## 2021-06-19 ENCOUNTER — Ambulatory Visit (HOSPITAL_COMMUNITY): Admission: RE | Admit: 2021-06-19 | Payer: Medicare Other | Source: Ambulatory Visit

## 2021-06-19 ENCOUNTER — Encounter (HOSPITAL_COMMUNITY): Payer: Self-pay

## 2021-06-19 ENCOUNTER — Other Ambulatory Visit: Payer: Self-pay

## 2021-06-19 DIAGNOSIS — C679 Malignant neoplasm of bladder, unspecified: Secondary | ICD-10-CM | POA: Insufficient documentation

## 2021-06-19 DIAGNOSIS — M47814 Spondylosis without myelopathy or radiculopathy, thoracic region: Secondary | ICD-10-CM | POA: Diagnosis not present

## 2021-06-19 DIAGNOSIS — Z8551 Personal history of malignant neoplasm of bladder: Secondary | ICD-10-CM | POA: Diagnosis not present

## 2021-06-19 DIAGNOSIS — N1330 Unspecified hydronephrosis: Secondary | ICD-10-CM | POA: Diagnosis not present

## 2021-06-19 DIAGNOSIS — N261 Atrophy of kidney (terminal): Secondary | ICD-10-CM | POA: Diagnosis not present

## 2021-06-19 DIAGNOSIS — C670 Malignant neoplasm of trigone of bladder: Secondary | ICD-10-CM | POA: Diagnosis not present

## 2021-06-19 DIAGNOSIS — R918 Other nonspecific abnormal finding of lung field: Secondary | ICD-10-CM | POA: Diagnosis not present

## 2021-07-01 IMAGING — CT CT ANGIO NECK
2 of 8 series · 8 of 33 positions shown · IV contrast (Omnipaque or Isovue)
Comparison: Noncontrast head CT 08/06/2020.

CLINICAL DATA: Neuro deficit, acute, stroke suspected. Additional
history provided: Sudden onset of dizziness earlier today.

EXAM:
CT ANGIOGRAPHY HEAD AND NECK
TECHNIQUE: Multidetector CT imaging of the head and neck was performed using
the standard protocol during bolus administration of intravenous
contrast. Multiplanar CT image reconstructions and MIPs were
obtained to evaluate the vascular anatomy. Carotid stenosis
measurements (when applicable) are obtained utilizing NASCET
criteria, using the distal internal carotid diameter as the
denominator.
CONTRAST:  100mL OMNIPAQUE IOHEXOL 350 MG/ML SOLN

[Series 6: cta head & neck · axial · 0.49mm/px · z∈[-272,-21]mm · 6 of 701 slices shown]
[im 101/701  soft-tissue]
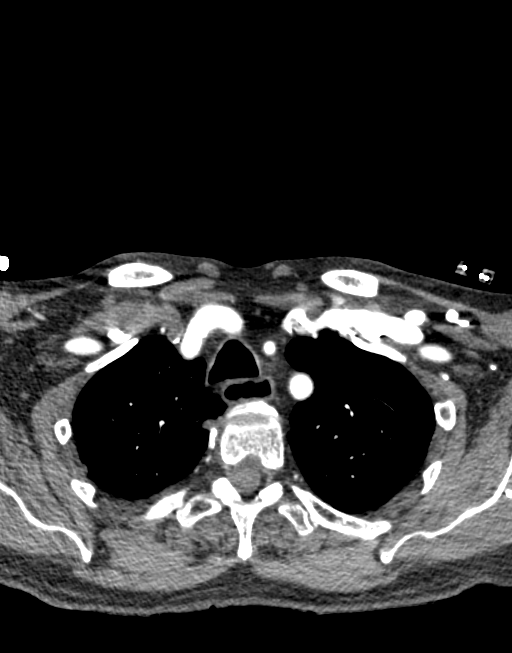
[im 201/701  bone]
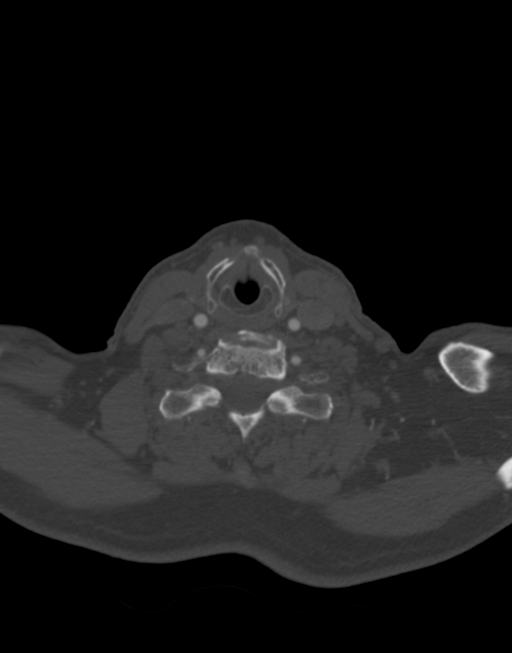
[im 301/701  soft-tissue]
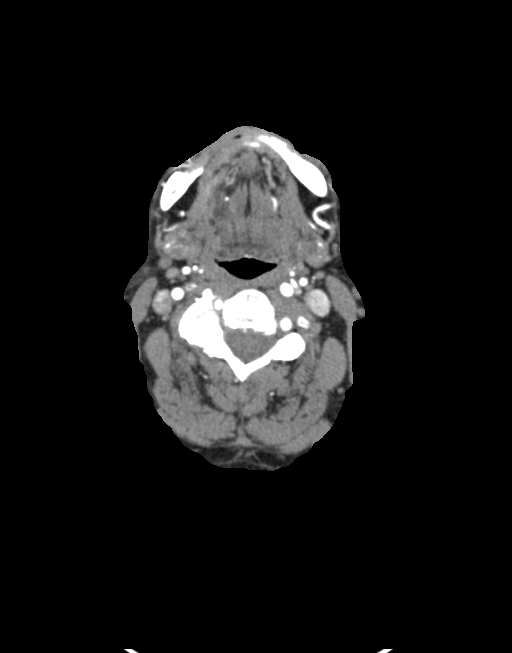
[im 401/701  bone]
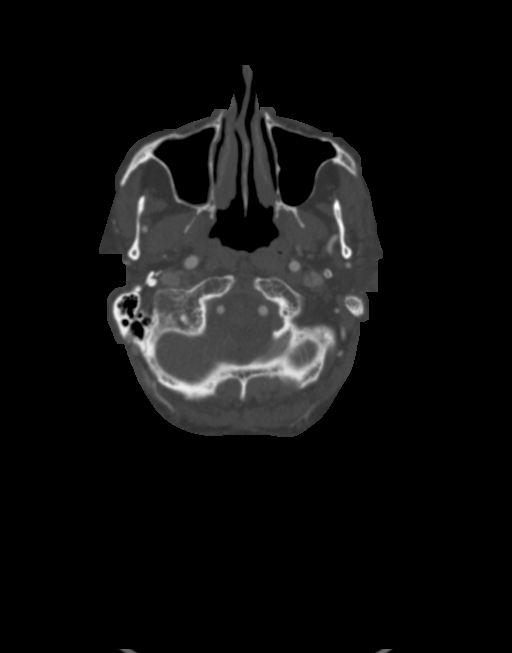
[im 501/701  soft-tissue]
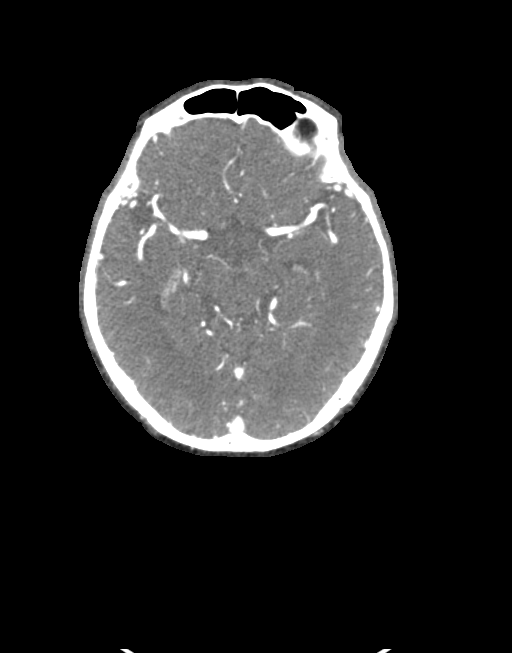
[im 601/701  bone]
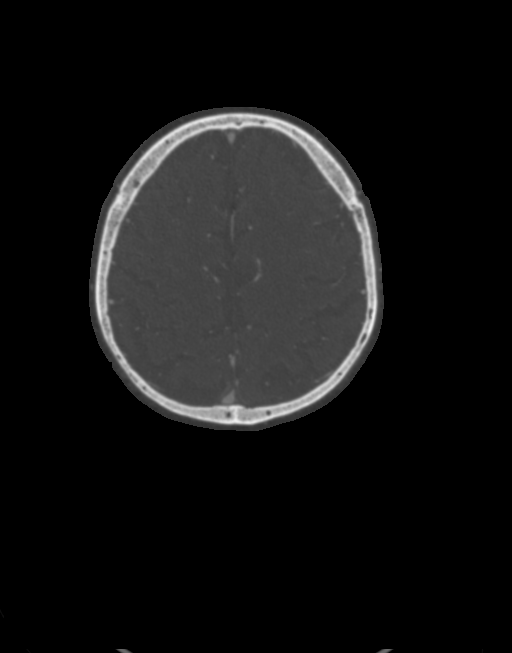

[Series 7: ax thins · axial · 0.41mm/px · z∈[-205,-87]mm · 2 of 352 slices shown]
[im 118/352  soft-tissue]
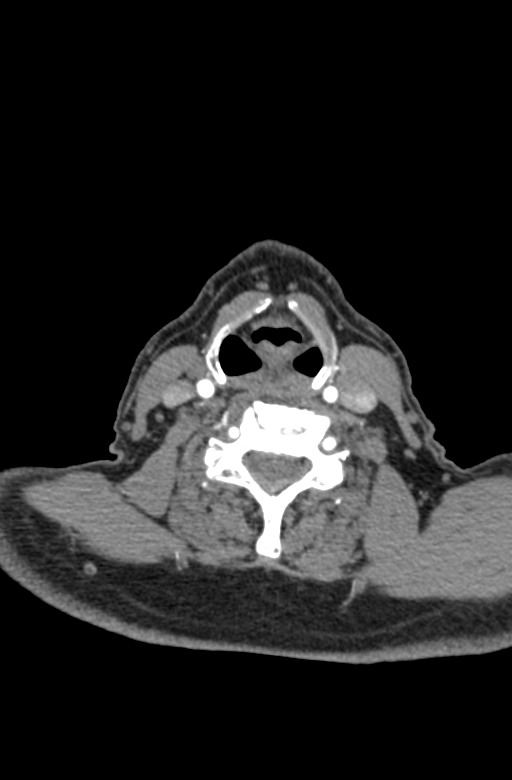
[im 235/352  soft-tissue]
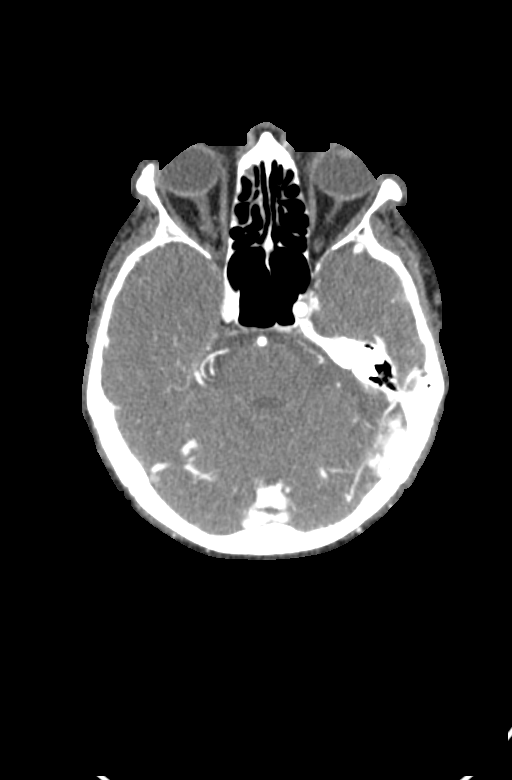

[8 of 33 positions shown; findings below may reference images not displayed]

FINDINGS: CTA NECK FINDINGS

Aortic arch: Standard aortic branching. Atherosclerotic plaque
within the visualized aortic arch and proximal major branch vessels
of the neck. No hemodynamically significant innominate or proximal
subclavian artery stenosis.

Right carotid system: CCA and ICA patent within the neck without
significant stenosis (50% or greater). Mild calcified plaque within
the carotid bifurcation.

Left carotid system: CCA and ICA patent within the neck without
significant stenosis (50% or greater). Mild calcified plaque within
the carotid bifurcation.

Vertebral arteries: Codominant and patent within the neck
bilaterally. Mixed plaque results in moderate/severe atherosclerotic
narrowing at the origin of the right vertebral artery. Mild
nonstenotic plaque at the origin of the left vertebral artery.

Skeleton: No acute bony abnormality or aggressive osseous lesion.
C3-C4 grade 1 anterolisthesis. Cervical spondylosis with multilevel
disc space narrowing, disc bulges, posterior disc osteophytes,
uncovertebral hypertrophy and facet arthrosis. Disc space narrowing
is severe at C4-C5, C5-C6 and C6-C7.

Other neck: No neck mass or cervical lymphadenopathy. Thyroid
unremarkable.

Upper chest: No consolidation within the imaged lung apices.
Centrilobular and paraseptal emphysema.

Review of the MIP images confirms the above findings

CTA HEAD FINDINGS

Anterior circulation:

The intracranial internal carotid arteries are patent. Calcified
plaque within both vessels without stenosis. The M1 middle cerebral
arteries are patent without significant stenosis. No M2 proximal
branch occlusion or high-grade proximal stenosis is identified. The
anterior cerebral arteries are patent.

2 mm laterally projecting aneurysm arising from the cavernous left
ICA (series 7, image 113).

Posterior circulation:

The intracranial vertebral arteries are patent. The basilar artery
is patent. The posterior cerebral arteries are patent. A left
posterior communicating artery is present. The right posterior
communicating artery is hypoplastic or absent.

Venous sinuses: Within limitations of contrast timing, no convincing
thrombus.

Anatomic variants: As described

Review of the MIP images confirms the above findings
IMPRESSION: CTA neck:

1. The common and internal carotid arteries are patent within the
neck without significant stenosis. Mild calcified plaque within the
carotid bifurcations bilaterally.
2. The vertebral arteries are codominant and patent within the neck
bilaterally. Moderate/severe atherosclerotic narrowing at the origin
of the right vertebral artery.

CTA head:

1. No intracranial large vessel occlusion or proximal high-grade
arterial stenosis.
2. Non-stenotic calcified plaque within the intracranial internal
carotid arteries.
3. 2 mm laterally projecting aneurysm arising from the cavernous
left ICA.

## 2021-07-02 DIAGNOSIS — N130 Hydronephrosis with ureteropelvic junction obstruction: Secondary | ICD-10-CM | POA: Diagnosis not present

## 2021-07-02 DIAGNOSIS — C670 Malignant neoplasm of trigone of bladder: Secondary | ICD-10-CM | POA: Diagnosis not present

## 2021-07-30 DIAGNOSIS — Z8551 Personal history of malignant neoplasm of bladder: Secondary | ICD-10-CM | POA: Diagnosis not present

## 2021-07-30 DIAGNOSIS — E782 Mixed hyperlipidemia: Secondary | ICD-10-CM | POA: Diagnosis not present

## 2021-07-30 DIAGNOSIS — Z936 Other artificial openings of urinary tract status: Secondary | ICD-10-CM | POA: Diagnosis not present

## 2021-07-30 DIAGNOSIS — R7301 Impaired fasting glucose: Secondary | ICD-10-CM | POA: Diagnosis not present

## 2021-08-01 DIAGNOSIS — K219 Gastro-esophageal reflux disease without esophagitis: Secondary | ICD-10-CM | POA: Diagnosis not present

## 2021-08-01 DIAGNOSIS — R809 Proteinuria, unspecified: Secondary | ICD-10-CM | POA: Diagnosis not present

## 2021-08-01 DIAGNOSIS — N1831 Chronic kidney disease, stage 3a: Secondary | ICD-10-CM | POA: Diagnosis not present

## 2021-08-01 DIAGNOSIS — Z23 Encounter for immunization: Secondary | ICD-10-CM | POA: Diagnosis not present

## 2021-08-01 DIAGNOSIS — R82998 Other abnormal findings in urine: Secondary | ICD-10-CM | POA: Diagnosis not present

## 2021-08-01 DIAGNOSIS — E875 Hyperkalemia: Secondary | ICD-10-CM | POA: Diagnosis not present

## 2021-08-01 DIAGNOSIS — Z0001 Encounter for general adult medical examination with abnormal findings: Secondary | ICD-10-CM | POA: Diagnosis not present

## 2021-08-01 DIAGNOSIS — E782 Mixed hyperlipidemia: Secondary | ICD-10-CM | POA: Diagnosis not present

## 2021-08-01 DIAGNOSIS — C689 Malignant neoplasm of urinary organ, unspecified: Secondary | ICD-10-CM | POA: Diagnosis not present

## 2021-08-01 DIAGNOSIS — D649 Anemia, unspecified: Secondary | ICD-10-CM | POA: Diagnosis not present

## 2021-08-01 DIAGNOSIS — R7301 Impaired fasting glucose: Secondary | ICD-10-CM | POA: Diagnosis not present

## 2021-10-22 DIAGNOSIS — Z936 Other artificial openings of urinary tract status: Secondary | ICD-10-CM | POA: Diagnosis not present

## 2021-10-22 DIAGNOSIS — Z8551 Personal history of malignant neoplasm of bladder: Secondary | ICD-10-CM | POA: Diagnosis not present

## 2021-12-23 DIAGNOSIS — Z8551 Personal history of malignant neoplasm of bladder: Secondary | ICD-10-CM | POA: Diagnosis not present

## 2021-12-23 DIAGNOSIS — Z936 Other artificial openings of urinary tract status: Secondary | ICD-10-CM | POA: Diagnosis not present

## 2022-01-09 DIAGNOSIS — D485 Neoplasm of uncertain behavior of skin: Secondary | ICD-10-CM | POA: Diagnosis not present

## 2022-01-09 DIAGNOSIS — L570 Actinic keratosis: Secondary | ICD-10-CM | POA: Diagnosis not present

## 2022-01-09 DIAGNOSIS — L818 Other specified disorders of pigmentation: Secondary | ICD-10-CM | POA: Diagnosis not present

## 2022-01-09 DIAGNOSIS — L821 Other seborrheic keratosis: Secondary | ICD-10-CM | POA: Diagnosis not present

## 2022-01-09 DIAGNOSIS — L989 Disorder of the skin and subcutaneous tissue, unspecified: Secondary | ICD-10-CM | POA: Diagnosis not present

## 2022-01-09 DIAGNOSIS — L720 Epidermal cyst: Secondary | ICD-10-CM | POA: Diagnosis not present

## 2022-01-09 DIAGNOSIS — X32XXXD Exposure to sunlight, subsequent encounter: Secondary | ICD-10-CM | POA: Diagnosis not present

## 2022-01-09 DIAGNOSIS — D225 Melanocytic nevi of trunk: Secondary | ICD-10-CM | POA: Diagnosis not present

## 2022-01-27 DIAGNOSIS — E782 Mixed hyperlipidemia: Secondary | ICD-10-CM | POA: Diagnosis not present

## 2022-01-27 DIAGNOSIS — R7301 Impaired fasting glucose: Secondary | ICD-10-CM | POA: Diagnosis not present

## 2022-01-30 DIAGNOSIS — D485 Neoplasm of uncertain behavior of skin: Secondary | ICD-10-CM | POA: Diagnosis not present

## 2022-01-30 DIAGNOSIS — C689 Malignant neoplasm of urinary organ, unspecified: Secondary | ICD-10-CM | POA: Diagnosis not present

## 2022-01-30 DIAGNOSIS — N1832 Chronic kidney disease, stage 3b: Secondary | ICD-10-CM | POA: Diagnosis not present

## 2022-01-30 DIAGNOSIS — E782 Mixed hyperlipidemia: Secondary | ICD-10-CM | POA: Diagnosis not present

## 2022-01-30 DIAGNOSIS — D649 Anemia, unspecified: Secondary | ICD-10-CM | POA: Diagnosis not present

## 2022-01-30 DIAGNOSIS — H61002 Unspecified perichondritis of left external ear: Secondary | ICD-10-CM | POA: Diagnosis not present

## 2022-01-30 DIAGNOSIS — L720 Epidermal cyst: Secondary | ICD-10-CM | POA: Diagnosis not present

## 2022-01-30 DIAGNOSIS — R809 Proteinuria, unspecified: Secondary | ICD-10-CM | POA: Diagnosis not present

## 2022-01-30 DIAGNOSIS — R7301 Impaired fasting glucose: Secondary | ICD-10-CM | POA: Diagnosis not present

## 2022-01-30 DIAGNOSIS — R17 Unspecified jaundice: Secondary | ICD-10-CM | POA: Diagnosis not present

## 2022-01-30 DIAGNOSIS — H9113 Presbycusis, bilateral: Secondary | ICD-10-CM | POA: Diagnosis not present

## 2022-01-30 DIAGNOSIS — K219 Gastro-esophageal reflux disease without esophagitis: Secondary | ICD-10-CM | POA: Diagnosis not present

## 2022-01-30 DIAGNOSIS — R82998 Other abnormal findings in urine: Secondary | ICD-10-CM | POA: Diagnosis not present

## 2022-01-30 DIAGNOSIS — E875 Hyperkalemia: Secondary | ICD-10-CM | POA: Diagnosis not present

## 2022-01-30 DIAGNOSIS — L988 Other specified disorders of the skin and subcutaneous tissue: Secondary | ICD-10-CM | POA: Diagnosis not present

## 2022-02-13 DIAGNOSIS — L720 Epidermal cyst: Secondary | ICD-10-CM | POA: Diagnosis not present

## 2022-02-13 DIAGNOSIS — D485 Neoplasm of uncertain behavior of skin: Secondary | ICD-10-CM | POA: Diagnosis not present

## 2022-02-17 ENCOUNTER — Encounter: Payer: Self-pay | Admitting: *Deleted

## 2022-02-25 DIAGNOSIS — Z8551 Personal history of malignant neoplasm of bladder: Secondary | ICD-10-CM | POA: Diagnosis not present

## 2022-02-25 DIAGNOSIS — Z936 Other artificial openings of urinary tract status: Secondary | ICD-10-CM | POA: Diagnosis not present

## 2022-03-05 ENCOUNTER — Encounter: Payer: Self-pay | Admitting: Internal Medicine

## 2022-03-06 DIAGNOSIS — Z936 Other artificial openings of urinary tract status: Secondary | ICD-10-CM | POA: Diagnosis not present

## 2022-03-06 DIAGNOSIS — Z8551 Personal history of malignant neoplasm of bladder: Secondary | ICD-10-CM | POA: Diagnosis not present

## 2022-03-12 DIAGNOSIS — N1832 Chronic kidney disease, stage 3b: Secondary | ICD-10-CM | POA: Diagnosis not present

## 2022-03-12 DIAGNOSIS — E782 Mixed hyperlipidemia: Secondary | ICD-10-CM | POA: Diagnosis not present

## 2022-04-01 ENCOUNTER — Ambulatory Visit: Payer: PRIVATE HEALTH INSURANCE | Admitting: Internal Medicine

## 2022-04-01 ENCOUNTER — Encounter: Payer: Self-pay | Admitting: Internal Medicine

## 2022-04-01 ENCOUNTER — Ambulatory Visit (INDEPENDENT_AMBULATORY_CARE_PROVIDER_SITE_OTHER): Payer: Medicare Other | Admitting: Internal Medicine

## 2022-04-01 VITALS — BP 162/88 | HR 63 | Temp 97.3°F | Ht 67.0 in | Wt 164.4 lb

## 2022-04-01 DIAGNOSIS — Z1211 Encounter for screening for malignant neoplasm of colon: Secondary | ICD-10-CM | POA: Diagnosis not present

## 2022-04-01 DIAGNOSIS — K219 Gastro-esophageal reflux disease without esophagitis: Secondary | ICD-10-CM

## 2022-04-01 NOTE — Progress Notes (Unsigned)
Primary Care Physician:  [name], MD Primary Gastroenterologist:  Dr. [name]  Pre-Procedure History & Physical: HPI:  [name] is a 81 y.o. male here for consideration of 1 more screening colonoscopy patient had a negative colonoscopy 12 years ago (hyperplastic polyps).  No bowel symptoms.  No family history of colon cancer.  Has had issues with the prostate and bladder cancer has permanent urostomy. GERD well-controlled on Protonix 20 mg daily.  Past Medical History:  Diagnosis Date   Bladder cancer [facility]) dx'd 12/2017   urologist-  dr Tresa Moore   BPH (benign prostatic hyperplasia)    Full dentures    GERD (gastroesophageal reflux disease)    Hiatal hernia    History of bladder stone    History of kidney stones    per pt hx passed stones spontenaously   Hypercholesterolemia    Wears glasses    Wears hearing aid in both ears     Past Surgical History:  Procedure Laterality Date   COLONOSCOPY WITH ESOPHAGOGASTRODUODENOSCOPY (EGD)  04-08-2012  dr [name]   CYSTO/  BLADDER STONE EXTRACTIONS  1998 approx.   CYSTOSCOPY WITH INJECTION N/A 06/11/2018   Procedure: CYSTOSCOPY WITH INJECTION;  Surgeon: Alexis Frock, MD;  Location: [facility];  Service: Urology;  Laterality: N/A;   IR FLUORO GUIDE PORT INSERTION RIGHT  01/07/2018   IR REMOVAL TUN ACCESS W/ PORT W/O FL MOD SED  04/05/2019   IR US GUIDE VASC ACCESS RIGHT  01/07/2018   ROBOT ASSISTED LAPAROSCOPIC RADICAL PROSTATECTOMY N/A 06/11/2018   Procedure: XI ROBOT ASSISTED LAPAROSCOPIC RADICAL CYSTOPROSTATECTOMY BILATERAL PELVIC LYMPHADENECTOMY,NO ORTHOTOPIC NEOBLADDER - ILEAL;  Surgeon: Alexis Frock, MD;  Location: [facility];  Service: Urology;  Laterality: N/A;   TRANSURETHRAL RESECTION OF BLADDER TUMOR N/A 12/11/2017   Procedure: CYSTOSCOPY WITH BILATERAL RETROGRADE PYELOGRAM, TRANSURETHRAL RESECTION OF BLADDER TUMOR (TURBT);  Surgeon: Alexis Frock, MD;  Location: [facility];  Service: Urology;  Laterality: N/A;    UMBILICAL HERNIA REPAIR  2008   VASECTOMY  1980s    Prior to Admission medications   Medication Sig Start Date End Date Taking? Authorizing Provider  aspirin EC 81 MG tablet Take 81 mg by mouth daily. Swallow whole.   Yes [provider]  atorvastatin (LIPITOR) 80 MG tablet Take 1 tablet (80 mg total) by mouth daily. For stroke Prevention 08/06/20 04/01/22 Yes Emokpae, Courage, MD  Multiple Vitamin (MULTIVITAMIN) tablet Take 1 tablet by mouth daily.   Yes [provider]  pantoprazole (PROTONIX) 20 MG tablet Take 20 mg by mouth daily. 07/19/20  Yes [provider]    Allergies as of 04/01/2022 - Review Complete 04/01/2022  Allergen Reaction Noted   Emend [fosaprepitant dimeglumine] Cough 01/18/2018    Family History  Problem Relation Age of Onset   Colon cancer Paternal Grandfather     Social History   Socioeconomic History   Marital status: Married    Spouse name: Not on file   Number of children: 2   Years of education: Not on file   Highest education level: Not on file  Occupational History   Occupation: retired, [employer]    Employer: [employer]  Tobacco Use   Smoking status: Former    Packs/day: 0.50    Years: 20.00    Total pack years: 10.00    Types: Cigarettes    Quit date: 03/26/1998    Years since quitting: 24.0   Smokeless tobacco: Never  Vaping Use   Vaping  Use: Never used  Substance and Sexual Activity   Alcohol use: Yes    Comment: occasional wine, beer   Drug use: No   Sexual activity: Yes  Other Topics Concern   Not on file  Social History Narrative   Lives w/ wife   Social Determinants of Health   Financial Resource Strain: Low Risk  (06/10/2018)   Overall Financial Resource Strain (CARDIA)    Difficulty of Paying Living Expenses: Not hard at all  Food Insecurity: No Food Insecurity (06/10/2018)   Hunger Vital Sign    Worried About Running Out of Food in the Last Year: Never true    Ran Out of Food in  the Last Year: Never true  Transportation Needs: No Transportation Needs (06/10/2018)   PRAPARE - Hydrologist (Medical): No    Lack of Transportation (Non-Medical): No  Physical Activity: Insufficiently Active (06/10/2018)   Exercise Vital Sign    Days of Exercise per Week: 3 days    Minutes of Exercise per Session: 30 min  Stress: No Stress Concern Present (06/10/2018)   [place]    Feeling of Stress : Not at all  Social Connections: [place] (06/10/2018)   Social Connection and Isolation Panel [NHANES]    Frequency of Communication with Friends and Family: More than three times a week    Frequency of Social Gatherings with Friends and Family: More than three times a week    Attends Religious Services: More than 4 times per year    Active Member of [employer] or Organizations: Yes    Attends Music therapist: More than 4 times per year    Marital Status: Married  Human resources officer Violence: Not At Risk (06/10/2018)   Humiliation, Afraid, Rape, and Kick questionnaire    Fear of Current or Ex-Partner: No    Emotionally Abused: No    Physically Abused: No    Sexually Abused: No    Review of Systems: See HPI, otherwise negative ROS  Physical Exam: BP (!) 162/88 (BP Location: Right Arm, Patient Position: Sitting, Cuff Size: Normal)   Pulse 63   Temp (!) 97.3 F (36.3 C) (Temporal)   Ht '5\' 7"'$  (1.702 m)   Wt 164 lb 6.4 oz (74.6 kg)   SpO2 95%   BMI 25.75 kg/m  General:   Alert,  Well-developed, well-nourished, pleasant and cooperative in NAD.  He appears younger than his stated chronological age. rvical adenopathy. Lungs:  Clear throughout to auscultation.   No wheezes, crackles, or rhonchi. No acute distress. Heart:  Regular rate and rhythm; no murmurs, clicks, rubs,  or gallops. Abdomen: Urostomy with bag in place.  Clear amber urine in bag.  Abdomen is soft and  nontender  ostomy looks healthy.   Pulses:  Normal pulses noted. Extremities:  Without clubbing or edema.  Impression/Plan: 81 year old gentleman here for consideration of 1 more screening colonoscopy.  Although he has had some health challenges including prostate/bladder cancer, he continues to enjoy overall good health.  Very good quality of life. In this setting, I feel it is reasonable to perform 1 more screening colonoscopy.  We talked about alternatives including Cologuard. After some discussion, there is mutual agreement to pursue 1 more screening colonoscopy.  To this end, I have offered the patient a screening colonoscopy The risks, benefits, limitations, alternatives and imponderables have been reviewed with the patient. Questions have been answered. All parties are agreeable.  Notice: This dictation was prepared with Dragon dictation along with smaller phrase technology. Any transcriptional errors that result from this process are unintentional and may not be corrected upon review.  `

## 2022-04-01 NOTE — Patient Instructions (Signed)
It was good to see you again today!  As we discussed, we will proceed with 1 more screening colonoscopy (ASA 2)  Further recommendations to follow once colonoscopy has been completed.

## 2022-04-02 ENCOUNTER — Telehealth: Payer: Self-pay | Admitting: *Deleted

## 2022-04-02 MED ORDER — PEG 3350-KCL-NA BICARB-NACL 420 G PO SOLR: ORAL | 0 refills | Status: DC

## 2022-04-02 NOTE — Telephone Encounter (Signed)
LMOVM to call back to schedule TCS with Dr. [name], asa 2

## 2022-04-02 NOTE — Telephone Encounter (Signed)
Spoke with pt. He has been scheduled for 7/21 at 8:15am. Aware will mail instructions and send rx to [facility].  PA approved via [facility]. Auth# [id number], DOS: May 02, 2022 - Jul 31, 2022

## 2022-04-28 DIAGNOSIS — Z8551 Personal history of malignant neoplasm of bladder: Secondary | ICD-10-CM | POA: Diagnosis not present

## 2022-04-28 DIAGNOSIS — Z936 Other artificial openings of urinary tract status: Secondary | ICD-10-CM | POA: Diagnosis not present

## 2022-05-02 ENCOUNTER — Ambulatory Visit (HOSPITAL_COMMUNITY): Payer: Medicare Other | Admitting: Anesthesiology

## 2022-05-02 ENCOUNTER — Encounter (HOSPITAL_COMMUNITY)
Admission: RE | Disposition: A | Discharge status: Home / Self Care | Payer: Self-pay | Source: Ambulatory Visit | Attending: Internal Medicine

## 2022-05-02 ENCOUNTER — Ambulatory Visit (HOSPITAL_COMMUNITY)
Admission: RE | Admit: 2022-05-02 | Discharge: 2022-05-02 | Disposition: A | Discharge status: Home / Self Care | Payer: Medicare Other | Source: Ambulatory Visit | Attending: Internal Medicine | Admitting: Internal Medicine

## 2022-05-02 ENCOUNTER — Encounter (HOSPITAL_COMMUNITY): Payer: Self-pay | Admitting: Internal Medicine

## 2022-05-02 ENCOUNTER — Ambulatory Visit (HOSPITAL_BASED_OUTPATIENT_CLINIC_OR_DEPARTMENT_OTHER): Payer: Medicare Other | Admitting: Anesthesiology

## 2022-05-02 ENCOUNTER — Other Ambulatory Visit: Payer: Self-pay

## 2022-05-02 DIAGNOSIS — Z8673 Personal history of transient ischemic attack (TIA), and cerebral infarction without residual deficits: Secondary | ICD-10-CM | POA: Diagnosis not present

## 2022-05-02 DIAGNOSIS — Z1211 Encounter for screening for malignant neoplasm of colon: Secondary | ICD-10-CM | POA: Insufficient documentation

## 2022-05-02 DIAGNOSIS — K219 Gastro-esophageal reflux disease without esophagitis: Secondary | ICD-10-CM | POA: Diagnosis not present

## 2022-05-02 DIAGNOSIS — K449 Diaphragmatic hernia without obstruction or gangrene: Secondary | ICD-10-CM | POA: Insufficient documentation

## 2022-05-02 DIAGNOSIS — Z87891 Personal history of nicotine dependence: Secondary | ICD-10-CM | POA: Insufficient documentation

## 2022-05-02 HISTORY — PX: COLONOSCOPY WITH PROPOFOL: SHX5780

## 2022-05-02 SURGERY — COLONOSCOPY WITH PROPOFOL
Anesthesia: General

## 2022-05-02 MED ORDER — LIDOCAINE HCL (CARDIAC) PF 50 MG/5ML IV SOSY
PREFILLED_SYRINGE | INTRAVENOUS | Status: DC | PRN
  Administered 2022-05-02: 50 mg via INTRAVENOUS

## 2022-05-02 MED ORDER — PROPOFOL 500 MG/50ML IV EMUL
INTRAVENOUS | Status: DC | PRN
  Administered 2022-05-02: 100 ug/kg/min via INTRAVENOUS

## 2022-05-02 MED ORDER — PROPOFOL 10 MG/ML IV BOLUS
INTRAVENOUS | Status: DC | PRN
  Administered 2022-05-02: 100 mg via INTRAVENOUS

## 2022-05-02 MED ORDER — LACTATED RINGERS IV SOLN: INTRAVENOUS | Status: DC

## 2022-05-02 NOTE — Discharge Instructions (Addendum)
  Colonoscopy Discharge Instructions  Read the instructions outlined below and refer to this sheet in the next few weeks. These discharge instructions provide you with general information on caring for yourself after you leave the hospital. Your doctor may also give you specific instructions. While your treatment has been planned according to the most current medical practices available, unavoidable complications occasionally occur. If you have any problems or questions after discharge, call Dr. [name] at [phone]. ACTIVITY You may resume your regular activity, but move at a slower pace for the next 24 hours.  Take frequent rest periods for the next 24 hours.  Walking will help get rid of the air and reduce the bloated feeling in your belly (abdomen).  No driving for 24 hours (because of the medicine (anesthesia) used during the test).   Do not sign any important legal documents or operate any machinery for 24 hours (because of the anesthesia used during the test).  NUTRITION Drink plenty of fluids.  You may resume your normal diet as instructed by your doctor.  Begin with a light meal and progress to your normal diet. Heavy or fried foods are harder to digest and may make you feel sick to your stomach (nauseated).  Avoid alcoholic beverages for 24 hours or as instructed.  MEDICATIONS You may resume your normal medications unless your doctor tells you otherwise.  WHAT YOU CAN EXPECT TODAY Some feelings of bloating in the abdomen.  Passage of more gas than usual.  Spotting of blood in your stool or on the toilet paper.  IF YOU HAD POLYPS REMOVED DURING THE COLONOSCOPY: No aspirin products for 7 days or as instructed.  No alcohol for 7 days or as instructed.  Eat a soft diet for the next 24 hours.  FINDING OUT THE RESULTS OF YOUR TEST Not all test results are available during your visit. If your test results are not back during the visit, make an appointment with your caregiver to find out the  results. Do not assume everything is normal if you have not heard from your caregiver or the medical facility. It is important for you to follow up on all of your test results.  SEEK IMMEDIATE MEDICAL ATTENTION IF: You have more than a spotting of blood in your stool.  Your belly is swollen (abdominal distention).  You are nauseated or vomiting.  You have a temperature over 101.  You have abdominal pain or discomfort that is severe or gets worse throughout the day.    Your colonoscopy was normal today  Future colonoscopy is not recommended unless new symptoms develop  At patient request, I called Derrico Zhong at [phone] -call went to voicemail.  Left a message.

## 2022-05-02 NOTE — Op Note (Signed)
[facility] Patient Name: [name] Procedure Date: 05/02/2022 7:57 AM MRN: [id number] Date of Birth: 1941-04-27 Attending MD: Norvel Richards , MD CSN: [id number] Age: 81 Admit Type: Outpatient Procedure:                Colonoscopy Indications:              Screening for colorectal malignant neoplasm Providers:                Norvel Richards, MD, Rosina Lowenstein, RN, Raphael Gibney, Technician Referring MD:              Medicines:                Propofol per Anesthesia Complications:            No immediate complications. Estimated Blood Loss:     Estimated blood loss: none. Procedure:                Pre-Anesthesia Assessment:                           - Prior to the procedure, a History and Physical                            was performed, and patient medications and                            allergies were reviewed. The patient's tolerance of                            previous anesthesia was also reviewed. The risks                            and benefits of the procedure and the sedation                            options and risks were discussed with the patient.                            All questions were answered, and informed consent                            was obtained. Prior Anticoagulants: The patient has                            taken no previous anticoagulant or antiplatelet                            agents. ASA Grade Assessment: III - A patient with                            severe systemic disease. After reviewing the risks  and benefits, the patient was deemed in                            satisfactory condition to undergo the procedure.                           After obtaining informed consent, the colonoscope                            was passed under direct vision. Throughout the                            procedure, the patient's blood pressure, pulse, and                             oxygen saturations were monitored continuously. The                            [id number]) scope was introduced through the                            anus and advanced to the the cecum, identified by                            appendiceal orifice and ileocecal valve. The                            colonoscopy was performed without difficulty. The                            patient tolerated the procedure well. The quality                            of the bowel preparation was adequate. Scope In: 8:14:10 AM Scope Out: 8:24:45 AM Scope Withdrawal Time: 0 hours 6 minutes 15 seconds  Total Procedure Duration: 0 hours 10 minutes 35 seconds  Findings:      The perianal and digital rectal examinations were normal.      The colon (entire examined portion) appeared normal.      The retroflexed view of the distal rectum and anal verge was normal and       showed no anal or rectal abnormalities. Impression:               - The entire examined colon is normal.                           - The distal rectum and anal verge are normal on                            retroflexion view.                           - No specimens collected. Moderate Sedation:      Moderate (conscious) sedation was personally administered by an       anesthesia professional.  The following parameters were monitored: oxygen       saturation, heart rate, blood pressure, respiratory rate, EKG, adequacy       of pulmonary ventilation, and response to care. Recommendation:           - Patient has a contact number available for                            emergencies. The signs and symptoms of potential                            delayed complications were discussed with the                            patient. Return to normal activities tomorrow.                            Written discharge instructions were provided to the                            patient.                           - Resume previous diet.                            - No repeat colonoscopy due to age.                           - Return to GI clinic (date not yet determined). Procedure Code(s):        --- Professional ---                           [phone], Colonoscopy, flexible; diagnostic, including                            collection of specimen(s) by brushing or washing,                            when performed (separate procedure) Diagnosis Code(s):        --- Professional ---                           Z12.11, Encounter for screening for malignant                            neoplasm of colon CPT copyright 2019 American Medical Association. All rights reserved. The codes documented in this report are preliminary and upon coder review may  be revised to meet current compliance requirements. Cristopher Estimable. Latrina Guttman, MD Norvel Richards, MD 05/02/2022 8:32:07 AM This report has been signed electronically. Number of Addenda: 0

## 2022-05-02 NOTE — Transfer of Care (Signed)
Immediate Anesthesia Transfer of Care Note  Patient: [name]  Procedure(s) Performed: COLONOSCOPY WITH PROPOFOL  Patient Location: Endoscopy Unit  Anesthesia Type:General  Level of Consciousness: awake and patient cooperative  Airway & Oxygen Therapy: Patient Spontanous Breathing  Post-op Assessment: Report given to RN and Post -op Vital signs reviewed and stable  Post vital signs: Reviewed and stable  Last Vitals:  Vitals Value Taken Time  BP 98/61 05/02/22 0829  Temp 36.7 C 05/02/22 0829  Pulse 71                   0829  Resp 20 05/02/22 0829  SpO2 99 % 05/02/22 0829    Last Pain:  Vitals:   05/02/22 0829  TempSrc: Oral  PainSc: 0-No pain      Patients Stated Pain Goal: 6 (63/81/77 1165)  Complications: No notable events documented.

## 2022-05-02 NOTE — H&P (Signed)
$'@LOGO'D$ @   Primary Care Physician:  [name], MD Primary Gastroenterologist:  Dr. [name]  Pre-Procedure History & Physical: HPI:  [name] is a 81 y.o. male is here for a screening colonoscopy.  No bowel symptoms.  Negative colonoscopy 12 years ago.  Past Medical History:  Diagnosis Date   Bladder cancer [facility]) dx'd 12/2017   urologist-  dr Tresa Moore   BPH (benign prostatic hyperplasia)    Full dentures    GERD (gastroesophageal reflux disease)    Hiatal hernia    History of bladder stone    History of kidney stones    per pt hx passed stones spontenaously   Hypercholesterolemia    Wears glasses    Wears hearing aid in both ears     Past Surgical History:  Procedure Laterality Date   COLONOSCOPY WITH ESOPHAGOGASTRODUODENOSCOPY (EGD)  04-08-2012  dr [name]   CYSTO/  BLADDER STONE EXTRACTIONS  1998 approx.   CYSTOSCOPY WITH INJECTION N/A 06/11/2018   Procedure: CYSTOSCOPY WITH INJECTION;  Surgeon: Alexis Frock, MD;  Location: [facility];  Service: Urology;  Laterality: N/A;   IR FLUORO GUIDE PORT INSERTION RIGHT  01/07/2018   IR REMOVAL TUN ACCESS W/ PORT W/O FL MOD SED  04/05/2019   IR US GUIDE VASC ACCESS RIGHT  01/07/2018   ROBOT ASSISTED LAPAROSCOPIC RADICAL PROSTATECTOMY N/A 06/11/2018   Procedure: XI ROBOT ASSISTED LAPAROSCOPIC RADICAL CYSTOPROSTATECTOMY BILATERAL PELVIC LYMPHADENECTOMY,NO ORTHOTOPIC NEOBLADDER - ILEAL;  Surgeon: Alexis Frock, MD;  Location: [facility];  Service: Urology;  Laterality: N/A;   TRANSURETHRAL RESECTION OF BLADDER TUMOR N/A 12/11/2017   Procedure: CYSTOSCOPY WITH BILATERAL RETROGRADE PYELOGRAM, TRANSURETHRAL RESECTION OF BLADDER TUMOR (TURBT);  Surgeon: Alexis Frock, MD;  Location: [facility];  Service: Urology;  Laterality: N/A;   UMBILICAL HERNIA REPAIR  2008   VASECTOMY  1980s    Prior to Admission medications   Medication Sig Start Date End Date Taking? Authorizing Provider  aspirin EC 81 MG tablet Take 81 mg by mouth daily.  Swallow whole.   Yes [provider]  atorvastatin (LIPITOR) 80 MG tablet Take 1 tablet (80 mg total) by mouth daily. For stroke Prevention Patient taking differently: Take 80 mg by mouth at bedtime. For stroke Prevention 08/06/20 05/02/22 Yes Emokpae, Courage, MD  Multiple Vitamin (MULTIVITAMIN) tablet Take 1 tablet by mouth daily.   Yes [provider]  pantoprazole (PROTONIX) 20 MG tablet Take 20 mg by mouth daily. 07/19/20  Yes [provider]  polyethylene glycol-electrolytes (NULYTELY) 420 g solution As directed 04/02/22  Yes Yaxiel Minnie, Cristopher Estimable, MD    Allergies as of 04/02/2022 - Review Complete 04/01/2022  Allergen Reaction Noted   Emend [fosaprepitant dimeglumine] Cough 01/18/2018    Family History  Problem Relation Age of Onset   Colon cancer Paternal Grandfather     Social History   Socioeconomic History   Marital status: Married    Spouse name: Not on file   Number of children: 2   Years of education: Not on file   Highest education level: Not on file  Occupational History   Occupation: retired, [employer]    Employer: [employer]  Tobacco Use   Smoking status: Former    Packs/day: 0.50    Years: 20.00    Total pack years: 10.00    Types: Cigarettes    Quit date: 03/26/1998    Years since quitting: 24.1   Smokeless tobacco: Never  Vaping Use   Vaping Use: Never used  Substance and Sexual Activity   Alcohol use: Yes    Comment: occasional wine, beer   Drug use: No   Sexual activity: Yes  Other Topics Concern   Not on file  Social History Narrative   Lives w/ wife   Social Determinants of Health   Financial Resource Strain: Low Risk  (06/10/2018)   Overall Financial Resource Strain (CARDIA)    Difficulty of Paying Living Expenses: Not hard at all  Food Insecurity: No Food Insecurity (06/10/2018)   Hunger Vital Sign    Worried About Running Out of Food in the Last Year: Never true    Ran Out of Food in the Last Year:  Never true  Transportation Needs: No Transportation Needs (06/10/2018)   PRAPARE - Hydrologist (Medical): No    Lack of Transportation (Non-Medical): No  Physical Activity: Insufficiently Active (06/10/2018)   Exercise Vital Sign    Days of Exercise per Week: 3 days    Minutes of Exercise per Session: 30 min  Stress: No Stress Concern Present (06/10/2018)   [place]    Feeling of Stress : Not at all  Social Connections: [place] (06/10/2018)   Social Connection and Isolation Panel [NHANES]    Frequency of Communication with Friends and Family: More than three times a week    Frequency of Social Gatherings with Friends and Family: More than three times a week    Attends Religious Services: More than 4 times per year    Active Member of [employer] or Organizations: Yes    Attends Music therapist: More than 4 times per year    Marital Status: Married  Human resources officer Violence: Not At Risk (06/10/2018)   Humiliation, Afraid, Rape, and Kick questionnaire    Fear of Current or Ex-Partner: No    Emotionally Abused: No    Physically Abused: No    Sexually Abused: No    Review of Systems: See HPI, otherwise negative ROS  Physical Exam: BP (!) 156/97   Pulse 79   Temp (!) 97.3 F (36.3 C) (Oral)   Resp 12   Ht '5\' 8"'$  (1.727 m)   Wt 76.2 kg   SpO2 97%   BMI 25.54 kg/m  General:   Alert,  Well-developed, well-nourished, pleasant and cooperative in NAD Lungs:  Clear throughout to auscultation.   No wheezes, crackles, or rhonchi. No acute distress. Heart:  Regular rate and rhythm; no murmurs, clicks, rubs,  or gallops. Abdomen:  Soft, nontender and nondistended. No masses, hepatosplenomegaly or hernias noted. Normal bowel sounds, without guarding, and without rebound.   Impression/Plan: [name] is now here to undergo a screening colonoscopy.  Average risk screening  examination  Risks, benefits, limitations, imponderables and alternatives regarding colonoscopy have been reviewed with the patient. Questions have been answered. All parties agreeable.     Notice:  This dictation was prepared with Dragon dictation along with smaller phrase technology. Any transcriptional errors that result from this process are unintentional and may not be corrected upon review.

## 2022-05-02 NOTE — Anesthesia Postprocedure Evaluation (Signed)
Anesthesia Post Note  Patient: [name]  Procedure(s) Performed: COLONOSCOPY WITH PROPOFOL  Patient location during evaluation: Phase II Anesthesia Type: General Level of consciousness: awake and alert and oriented Pain management: pain level controlled Vital Signs Assessment: post-procedure vital signs reviewed and stable Respiratory status: spontaneous breathing, nonlabored ventilation and respiratory function stable Cardiovascular status: blood pressure returned to baseline and stable Postop Assessment: no apparent nausea or vomiting Anesthetic complications: no   No notable events documented.   Last Vitals:  Vitals:   05/02/22 0710 05/02/22 0829  BP: (!) 156/97 98/61  Pulse: 79   Resp: 12 20  Temp: (!) 36.3 C 36.7 C  SpO2: 97% 99%    Last Pain:  Vitals:   05/02/22 0829  TempSrc: Oral  PainSc: 0-No pain                 Mako Pelfrey C Sheppard Luckenbach

## 2022-05-02 NOTE — Anesthesia Preprocedure Evaluation (Signed)
Anesthesia Evaluation  Patient identified by MRN, date of birth, ID band Patient awake    Reviewed: Allergy & Precautions, NPO status , Patient's Chart, lab work & pertinent test results  Airway Mallampati: II  TM Distance: >3 FB Neck ROM: Full    Dental  (+) Dental Advisory Given, Upper Dentures, Lower Dentures   Pulmonary neg pulmonary ROS, former smoker,    Pulmonary exam normal breath sounds clear to auscultation       Cardiovascular negative cardio ROS Normal cardiovascular exam Rhythm:Regular Rate:Normal     Neuro/Psych TIA Neuromuscular disease negative psych ROS   GI/Hepatic Neg liver ROS, hiatal hernia, GERD  Medicated and Controlled,  Endo/Other  negative endocrine ROS  Renal/GU negative Renal ROS Bladder dysfunction (bladder cancer)      Musculoskeletal negative musculoskeletal ROS (+)   Abdominal   Peds negative pediatric ROS (+)  Hematology negative hematology ROS (+)   Anesthesia Other Findings   Reproductive/Obstetrics negative OB ROS                            Anesthesia Physical Anesthesia Plan  ASA: 2  Anesthesia Plan: General   Post-op Pain Management: Minimal or no pain anticipated   Induction:   PONV Risk Score and Plan: Propofol infusion  Airway Management Planned: Nasal Cannula and Natural Airway  Additional Equipment:   Intra-op Plan:   Post-operative Plan:   Informed Consent: I have reviewed the patients History and Physical, chart, labs and discussed the procedure including the risks, benefits and alternatives for the proposed anesthesia with the patient or authorized representative who has indicated his/her understanding and acceptance.     Dental advisory given  Plan Discussed with: CRNA and Surgeon  Anesthesia Plan Comments:         Anesthesia Quick Evaluation

## 2022-05-08 ENCOUNTER — Encounter (HOSPITAL_COMMUNITY): Payer: Self-pay | Admitting: Internal Medicine

## 2022-05-26 DIAGNOSIS — Z8551 Personal history of malignant neoplasm of bladder: Secondary | ICD-10-CM | POA: Diagnosis not present

## 2022-05-26 DIAGNOSIS — Z936 Other artificial openings of urinary tract status: Secondary | ICD-10-CM | POA: Diagnosis not present

## 2022-05-29 DIAGNOSIS — Z1283 Encounter for screening for malignant neoplasm of skin: Secondary | ICD-10-CM | POA: Diagnosis not present

## 2022-05-29 DIAGNOSIS — D225 Melanocytic nevi of trunk: Secondary | ICD-10-CM | POA: Diagnosis not present

## 2022-06-30 DIAGNOSIS — Z8551 Personal history of malignant neoplasm of bladder: Secondary | ICD-10-CM | POA: Diagnosis not present

## 2022-06-30 DIAGNOSIS — Z936 Other artificial openings of urinary tract status: Secondary | ICD-10-CM | POA: Diagnosis not present

## 2022-07-02 DIAGNOSIS — Z8551 Personal history of malignant neoplasm of bladder: Secondary | ICD-10-CM | POA: Diagnosis not present

## 2022-07-02 DIAGNOSIS — Z936 Other artificial openings of urinary tract status: Secondary | ICD-10-CM | POA: Diagnosis not present

## 2022-07-22 ENCOUNTER — Other Ambulatory Visit (HOSPITAL_COMMUNITY): Payer: Self-pay | Admitting: Urology

## 2022-07-22 ENCOUNTER — Ambulatory Visit (HOSPITAL_COMMUNITY)
Admission: RE | Admit: 2022-07-22 | Discharge: 2022-07-22 | Disposition: A | Discharge status: Home / Self Care | Payer: Medicare Other | Source: Ambulatory Visit | Attending: Urology | Admitting: Urology

## 2022-07-22 DIAGNOSIS — N130 Hydronephrosis with ureteropelvic junction obstruction: Secondary | ICD-10-CM | POA: Diagnosis not present

## 2022-07-22 DIAGNOSIS — N134 Hydroureter: Secondary | ICD-10-CM | POA: Diagnosis not present

## 2022-07-22 DIAGNOSIS — I700 Atherosclerosis of aorta: Secondary | ICD-10-CM | POA: Diagnosis not present

## 2022-07-22 DIAGNOSIS — R109 Unspecified abdominal pain: Secondary | ICD-10-CM | POA: Diagnosis not present

## 2022-07-22 DIAGNOSIS — C670 Malignant neoplasm of trigone of bladder: Secondary | ICD-10-CM | POA: Diagnosis not present

## 2022-07-25 DIAGNOSIS — R7301 Impaired fasting glucose: Secondary | ICD-10-CM | POA: Diagnosis not present

## 2022-07-25 DIAGNOSIS — E782 Mixed hyperlipidemia: Secondary | ICD-10-CM | POA: Diagnosis not present

## 2022-07-29 DIAGNOSIS — N130 Hydronephrosis with ureteropelvic junction obstruction: Secondary | ICD-10-CM | POA: Diagnosis not present

## 2022-07-29 DIAGNOSIS — C670 Malignant neoplasm of trigone of bladder: Secondary | ICD-10-CM | POA: Diagnosis not present

## 2022-08-11 DIAGNOSIS — Z23 Encounter for immunization: Secondary | ICD-10-CM | POA: Diagnosis not present

## 2022-08-11 DIAGNOSIS — R17 Unspecified jaundice: Secondary | ICD-10-CM | POA: Diagnosis not present

## 2022-08-11 DIAGNOSIS — D649 Anemia, unspecified: Secondary | ICD-10-CM | POA: Diagnosis not present

## 2022-08-11 DIAGNOSIS — R809 Proteinuria, unspecified: Secondary | ICD-10-CM | POA: Diagnosis not present

## 2022-08-11 DIAGNOSIS — N1832 Chronic kidney disease, stage 3b: Secondary | ICD-10-CM | POA: Diagnosis not present

## 2022-08-11 DIAGNOSIS — E782 Mixed hyperlipidemia: Secondary | ICD-10-CM | POA: Diagnosis not present

## 2022-08-11 DIAGNOSIS — E875 Hyperkalemia: Secondary | ICD-10-CM | POA: Diagnosis not present

## 2022-08-11 DIAGNOSIS — R7301 Impaired fasting glucose: Secondary | ICD-10-CM | POA: Diagnosis not present

## 2022-08-11 DIAGNOSIS — H9113 Presbycusis, bilateral: Secondary | ICD-10-CM | POA: Diagnosis not present

## 2022-08-11 DIAGNOSIS — C689 Malignant neoplasm of urinary organ, unspecified: Secondary | ICD-10-CM | POA: Diagnosis not present

## 2022-08-11 DIAGNOSIS — K219 Gastro-esophageal reflux disease without esophagitis: Secondary | ICD-10-CM | POA: Diagnosis not present

## 2022-09-01 DIAGNOSIS — H43393 Other vitreous opacities, bilateral: Secondary | ICD-10-CM | POA: Diagnosis not present

## 2022-09-11 DIAGNOSIS — Z8551 Personal history of malignant neoplasm of bladder: Secondary | ICD-10-CM | POA: Diagnosis not present

## 2022-09-11 DIAGNOSIS — Z936 Other artificial openings of urinary tract status: Secondary | ICD-10-CM | POA: Diagnosis not present

## 2022-11-11 DIAGNOSIS — Z8551 Personal history of malignant neoplasm of bladder: Secondary | ICD-10-CM | POA: Diagnosis not present

## 2022-11-11 DIAGNOSIS — Z936 Other artificial openings of urinary tract status: Secondary | ICD-10-CM | POA: Diagnosis not present

## 2023-01-08 DIAGNOSIS — Z8551 Personal history of malignant neoplasm of bladder: Secondary | ICD-10-CM | POA: Diagnosis not present

## 2023-01-08 DIAGNOSIS — Z936 Other artificial openings of urinary tract status: Secondary | ICD-10-CM | POA: Diagnosis not present

## 2023-02-04 DIAGNOSIS — R7301 Impaired fasting glucose: Secondary | ICD-10-CM | POA: Diagnosis not present

## 2023-02-04 DIAGNOSIS — E782 Mixed hyperlipidemia: Secondary | ICD-10-CM | POA: Diagnosis not present

## 2023-02-09 DIAGNOSIS — Z0000 Encounter for general adult medical examination without abnormal findings: Secondary | ICD-10-CM | POA: Diagnosis not present

## 2023-02-10 DIAGNOSIS — H9110 Presbycusis, unspecified ear: Secondary | ICD-10-CM | POA: Diagnosis not present

## 2023-02-10 DIAGNOSIS — R7301 Impaired fasting glucose: Secondary | ICD-10-CM | POA: Diagnosis not present

## 2023-02-10 DIAGNOSIS — E875 Hyperkalemia: Secondary | ICD-10-CM | POA: Diagnosis not present

## 2023-02-10 DIAGNOSIS — C7910 Secondary malignant neoplasm of unspecified urinary organs: Secondary | ICD-10-CM | POA: Diagnosis not present

## 2023-02-10 DIAGNOSIS — E782 Mixed hyperlipidemia: Secondary | ICD-10-CM | POA: Diagnosis not present

## 2023-02-10 DIAGNOSIS — N1832 Chronic kidney disease, stage 3b: Secondary | ICD-10-CM | POA: Diagnosis not present

## 2023-02-10 DIAGNOSIS — C689 Malignant neoplasm of urinary organ, unspecified: Secondary | ICD-10-CM | POA: Diagnosis not present

## 2023-02-10 DIAGNOSIS — K219 Gastro-esophageal reflux disease without esophagitis: Secondary | ICD-10-CM | POA: Diagnosis not present

## 2023-02-10 DIAGNOSIS — R17 Unspecified jaundice: Secondary | ICD-10-CM | POA: Diagnosis not present

## 2023-02-10 DIAGNOSIS — D649 Anemia, unspecified: Secondary | ICD-10-CM | POA: Diagnosis not present

## 2023-02-10 DIAGNOSIS — R809 Proteinuria, unspecified: Secondary | ICD-10-CM | POA: Diagnosis not present

## 2023-02-18 DIAGNOSIS — Z936 Other artificial openings of urinary tract status: Secondary | ICD-10-CM | POA: Diagnosis not present

## 2023-02-18 DIAGNOSIS — Z8551 Personal history of malignant neoplasm of bladder: Secondary | ICD-10-CM | POA: Diagnosis not present

## 2023-02-23 ENCOUNTER — Other Ambulatory Visit (INDEPENDENT_AMBULATORY_CARE_PROVIDER_SITE_OTHER): Payer: Medicare Other

## 2023-02-23 ENCOUNTER — Encounter: Payer: Self-pay | Admitting: Orthopedic Surgery

## 2023-02-23 ENCOUNTER — Ambulatory Visit: Payer: Medicare Other | Admitting: Orthopedic Surgery

## 2023-02-23 VITALS — BP 162/99 | HR 72 | Ht 68.0 in | Wt 158.0 lb

## 2023-02-23 DIAGNOSIS — M19011 Primary osteoarthritis, right shoulder: Secondary | ICD-10-CM

## 2023-02-23 DIAGNOSIS — M25511 Pain in right shoulder: Secondary | ICD-10-CM

## 2023-02-23 DIAGNOSIS — M19019 Primary osteoarthritis, unspecified shoulder: Secondary | ICD-10-CM

## 2023-02-23 DIAGNOSIS — M75101 Unspecified rotator cuff tear or rupture of right shoulder, not specified as traumatic: Secondary | ICD-10-CM

## 2023-02-23 DIAGNOSIS — M12811 Other specific arthropathies, not elsewhere classified, right shoulder: Secondary | ICD-10-CM

## 2023-02-23 MED ORDER — METHYLPREDNISOLONE ACETATE 40 MG/ML IJ SUSP
40.0000 mg | Freq: Once | INTRAMUSCULAR | Status: AC
  Administered 2023-02-23: 40 mg via INTRA_ARTICULAR

## 2023-02-23 NOTE — Patient Instructions (Signed)
Physical therapy has been ordered for you at [facility]. They should call you to schedule, [phone] is the phone number to call, if you want to call to schedule.   

## 2023-02-23 NOTE — Addendum Note (Signed)
Addended byCaffie Damme on: 02/23/2023 11:47 AM   Modules accepted: Orders

## 2023-02-23 NOTE — Progress Notes (Addendum)
Patient ID: [name], male   DOB: [birth year], 82 y.o.   MRN: [id number]  SUMMARY AND PLAN:  82 year old male with atraumatic pain right shoulder consistent with rotator cuff tear and arthritis  Recommend subacromial injection and physical therapy   Procedure note the subacromial injection shoulder RIGHT    Verbal consent was obtained to inject the  RIGHT   Shoulder  Timeout was completed to confirm the injection site is a subacromial space of the  RIGHT  shoulder   Medication used Depo-Medrol 40 mg and lidocaine 1% 3 cc  Anesthesia was provided by ethyl chloride  The injection was performed in the RIGHT  posterior subacromial space. After pinning the skin with alcohol and anesthetized the skin with ethyl chloride the subacromial space was injected using a 20-gauge needle. There were no complications  Sterile dressing was applied.      Chief Complaint  Patient presents with   Shoulder Pain    Right for about a month       HPI [name] is a 82 y.o. male.  1 month history of pain right shoulder.  No trauma.  No previous history of shoulder pain.  History of pain with lying on his right side relieved somewhat by bringing his arm up above his head.  He has an ostomy and therefore cannot sleep on his stomach so he does get woken up at night trying to sleep on the right  Treatment:  Nsaids no PT no Injection no   Current Outpatient Medications  Medication Instructions   aspirin EC 81 mg, Oral, Daily, Swallow whole.   atorvastatin (LIPITOR) 80 mg, Oral, Daily, For stroke Prevention   Multiple Vitamin (MULTIVITAMIN) tablet 1 tablet, Oral, Daily   pantoprazole (PROTONIX) 20 mg, Oral, Daily   polyethylene glycol-electrolytes (NULYTELY) 420 g solution As directed    Allergies  Allergen Reactions   Emend [Fosaprepitant Dimeglumine] Cough    Reaction during infusion with cough, erythema of face and neck.      Review of Systems Review of Systems   Gastrointestinal:        Ostomy bag  All other systems reviewed and are negative.   Past Medical History:  Diagnosis Date   Bladder cancer [facility]) dx'd 12/2017   urologist-  dr Berneice Heinrich   BPH (benign prostatic hyperplasia)    Full dentures    GERD (gastroesophageal reflux disease)    Hiatal hernia    History of bladder stone    History of kidney stones    per pt hx passed stones spontenaously   Hypercholesterolemia    Wears glasses    Wears hearing aid in both ears     Past Surgical History:  Procedure Laterality Date   COLONOSCOPY WITH ESOPHAGOGASTRODUODENOSCOPY (EGD)  04-08-2012  dr Jena Gauss   COLONOSCOPY WITH PROPOFOL N/A 05/02/2022   Procedure: COLONOSCOPY WITH PROPOFOL;  Surgeon: Corbin Ade, MD;  [facility];  Service: Endoscopy;  Laterality: N/A;  8:15am, asa 2   CYSTO/  BLADDER STONE EXTRACTIONS  1998 approx.   CYSTOSCOPY WITH INJECTION N/A 06/11/2018   Procedure: CYSTOSCOPY WITH INJECTION;  Surgeon: Sebastian Ache, MD;  Location: [facility];  Service: Urology;  Laterality: N/A;   IR FLUORO GUIDE PORT INSERTION RIGHT  01/07/2018   IR REMOVAL TUN ACCESS W/ PORT W/O FL MOD SED  04/05/2019   IR US GUIDE VASC ACCESS RIGHT  01/07/2018   ROBOT ASSISTED LAPAROSCOPIC RADICAL PROSTATECTOMY N/A 06/11/2018   Procedure: XI ROBOT ASSISTED  LAPAROSCOPIC RADICAL CYSTOPROSTATECTOMY BILATERAL PELVIC LYMPHADENECTOMY,NO ORTHOTOPIC NEOBLADDER - ILEAL;  Surgeon: Sebastian Ache, MD;  Location: [facility];  Service: Urology;  Laterality: N/A;   TRANSURETHRAL RESECTION OF BLADDER TUMOR N/A 12/11/2017   Procedure: CYSTOSCOPY WITH BILATERAL RETROGRADE PYELOGRAM, TRANSURETHRAL RESECTION OF BLADDER TUMOR (TURBT);  Surgeon: Sebastian Ache, MD;  Location: [facility];  Service: Urology;  Laterality: N/A;   UMBILICAL HERNIA REPAIR  2008   VASECTOMY  1980s    Family History  Problem Relation Age of Onset   Colon cancer Paternal Grandfather      Social History   Tobacco Use    Smoking status: Former    Packs/day: 0.50    Years: 20.00    Additional pack years: 0.00    Total pack years: 10.00    Types: Cigarettes    Quit date: 03/26/1998    Years since quitting: 24.9   Smokeless tobacco: Never  Vaping Use   Vaping Use: Never used  Substance Use Topics   Alcohol use: Yes    Comment: occasional wine, beer   Drug use: No    Allergies  Allergen Reactions   Emend [Fosaprepitant Dimeglumine] Cough    Reaction during infusion with cough, erythema of face and neck.     Allergies  Allergen Reactions   Emend [Fosaprepitant Dimeglumine] Cough    Reaction during infusion with cough, erythema of face and neck.       Current Meds  Medication Sig   aspirin EC 81 MG tablet Take 81 mg by mouth daily. Swallow whole.   Multiple Vitamin (MULTIVITAMIN) tablet Take 1 tablet by mouth daily.   pantoprazole (PROTONIX) 20 MG tablet Take 20 mg by mouth daily.   polyethylene glycol-electrolytes (NULYTELY) 420 g solution As directed       Physical Exam BP (!) 162/99   Pulse 72   Ht 5\' 8"  (1.727 m)   Wt 158 lb (71.7 kg)   BMI 24.02 kg/m   Ambulatory status normal with no assistive devices  GENERAL : APPEARANCE IS NORMAL GROOMING IS GOOD  NORMAL MOOD AND AFFECT  AWAKE ALERT AND ORIENTED X 3   Right SHOULDER  TENDERNESS I could not elicit tenderness ROM is active flexion was 120 but he altered the position of his arm to get it to that position and his passive flexion was approximately 150 degrees  Passive abduction and active abduction were the same with pain at 90 degrees STABLE ANTERIORLY POSTERIORLY AND INFERIORLY SKIN CLEAN     PROVOCATIVE TESTS  DROP ARM TEST was negative PAINFUL ARC 90 degrees plus EMPTY CAN -JOBST TEST 3+ out of 5 weakness EXTERNAL ROTATION LAG TEST normal LIFT OFF TEST negative BELLYPRESS TEST normal NEER positive   MEDICAL DECISION MAKING  A.  Encounter Diagnoses  Name Primary?   Acute pain of right shoulder Yes    Glenohumeral arthritis    Right rotator cuff tear arthropathy     B. DATA ANALYSED:   IMAGING: Independent interpretation of images: Proximal migration of the humeral head with break in Shenton's line slight narrowing of the glenohumeral joint  Orders: Physical therapy  Outside records reviewed: None  C. MANAGEMENT nonoperative  This will likely be a chronic problem and last greater than 1 year  Injection and physical therapy  Return 2 months Meds ordered this encounter  Medications   methylPREDNISolone acetate (DEPO-MEDROL) injection 40 mg

## 2023-03-04 DIAGNOSIS — Z8551 Personal history of malignant neoplasm of bladder: Secondary | ICD-10-CM | POA: Diagnosis not present

## 2023-03-04 DIAGNOSIS — Z936 Other artificial openings of urinary tract status: Secondary | ICD-10-CM | POA: Diagnosis not present

## 2023-03-10 ENCOUNTER — Ambulatory Visit (HOSPITAL_COMMUNITY): Payer: Medicare Other | Attending: Orthopedic Surgery | Admitting: Occupational Therapy

## 2023-03-10 ENCOUNTER — Encounter (HOSPITAL_COMMUNITY): Payer: Self-pay | Admitting: Occupational Therapy

## 2023-03-10 DIAGNOSIS — M19019 Primary osteoarthritis, unspecified shoulder: Secondary | ICD-10-CM | POA: Insufficient documentation

## 2023-03-10 DIAGNOSIS — M25511 Pain in right shoulder: Secondary | ICD-10-CM | POA: Diagnosis not present

## 2023-03-10 DIAGNOSIS — R29898 Other symptoms and signs involving the musculoskeletal system: Secondary | ICD-10-CM | POA: Diagnosis not present

## 2023-03-10 DIAGNOSIS — M75101 Unspecified rotator cuff tear or rupture of right shoulder, not specified as traumatic: Secondary | ICD-10-CM | POA: Insufficient documentation

## 2023-03-10 DIAGNOSIS — M12811 Other specific arthropathies, not elsewhere classified, right shoulder: Secondary | ICD-10-CM | POA: Insufficient documentation

## 2023-03-10 NOTE — Patient Instructions (Signed)
1) Strengthening: Chest Pull - Resisted   Hold Theraband in front of body with hands about shoulder width a part. Pull band a part and back together slowly. Repeat __10-15__ times. Complete ___1_ set(s) per session.. Repeat ___1_ session(s) per day.  http://orth.exer.us/926   Copyright  VHI. All rights reserved.   2) PNF Strengthening: Resisted   Standing with resistive band around each hand, bring right arm up and away, thumb back. Repeat _10-15___ times per set. Do __1__ sets per session. Do __1__ sessions per day.    3) Resisted External Rotation: in Neutral - Bilateral   Sit or stand, tubing in both hands, elbows at sides, bent to 90, forearms forward. Pinch shoulder blades together and rotate forearms out. Keep elbows at sides. Repeat _10-15___ times per set. Do __1__ sets per session. Do _1___ sessions per day.  http://orth.exer.us/966   Copyright  VHI. All rights reserved.   4) PNF Strengthening: Resisted   Standing, hold resistive band above head. Bring right arm down and out from side. Repeat _10-15___ times per set. Do __1__ sets per session. Do __1__ sessions per day.  http://orth.exer.us/922   Copyright  VHI. All rights reserved.     Theraband strengthening: Complete 10-15X, 1-2X/day  1) Shoulder protraction  Anchor band in doorway, stand with back to door. Push your hand forward as much as you can to bringing your shoulder blades forward on your rib cage.      2) Shoulder horizontal abduction  Standing with a theraband anchored at chest height, begin with arm straight and some tension in the band. Move your arm out to your side (keeping straight the whole time). Bring the affected arm back to midline.     3) Shoulder Internal Rotation  While holding an elastic band at your side with your elbow bent, start with your hand away from your stomach, then pull the band towards your stomach. Keep your elbow near your side the entire time.     4)  Shoulder External Rotation  While holding an elastic band at your side with your elbow bent, start with your hand near your stomach and then pull the band away. Keep your elbow at your side the entire time.     5) Shoulder flexion  While standing with back to the door, holding Theraband at hand level, raise arm in front of you.  Keep elbow straight through entire movement.      6) Shoulder abduction  While holding an elastic band at your side, draw up your arm to the side keeping your elbow straight.   

## 2023-03-10 NOTE — Therapy (Signed)
OUTPATIENT OCCUPATIONAL THERAPY ORTHO EVALUATION  Patient Name: [name] MRN: [id number] DOB:Apr 17, 1941, 82 y.o., male Today's Date: 03/10/2023  PCP: Benita Stabile, MD REFERRING PROVIDER: Fuller Canada, MD  END OF SESSION:  OT End of Session - 03/10/23 1659     Visit Number 1    Number of Visits 1    Date for OT Re-Evaluation 03/11/23    Authorization Type [employer] Medicare    OT Start Time 1519    OT Stop Time 1552    OT Time Calculation (min) 33 min    Activity Tolerance Patient tolerated treatment well    Behavior During Therapy [facility] for tasks assessed/performed             Past Medical History:  Diagnosis Date   Bladder cancer (HCC) dx'd 12/2017   urologist-  dr Berneice Heinrich   BPH (benign prostatic hyperplasia)    Full dentures    GERD (gastroesophageal reflux disease)    Hiatal hernia    History of bladder stone    History of kidney stones    per pt hx passed stones spontenaously   Hypercholesterolemia    Wears glasses    Wears hearing aid in both ears    Past Surgical History:  Procedure Laterality Date   COLONOSCOPY WITH ESOPHAGOGASTRODUODENOSCOPY (EGD)  04-08-2012  dr Jena Gauss   COLONOSCOPY WITH PROPOFOL N/A 05/02/2022   Procedure: COLONOSCOPY WITH PROPOFOL;  Surgeon: Corbin Ade, MD;  Location: [facility];  Service: Endoscopy;  Laterality: N/A;  8:15am, asa 2   CYSTO/  BLADDER STONE EXTRACTIONS  1998 approx.   CYSTOSCOPY WITH INJECTION N/A 06/11/2018   Procedure: CYSTOSCOPY WITH INJECTION;  Surgeon: Sebastian Ache, MD;  Location: [facility];  Service: Urology;  Laterality: N/A;   IR FLUORO GUIDE PORT INSERTION RIGHT  01/07/2018   IR REMOVAL TUN ACCESS W/ PORT W/O FL MOD SED  04/05/2019   IR US GUIDE VASC ACCESS RIGHT  01/07/2018   ROBOT ASSISTED LAPAROSCOPIC RADICAL PROSTATECTOMY N/A 06/11/2018   Procedure: XI ROBOT ASSISTED LAPAROSCOPIC RADICAL CYSTOPROSTATECTOMY BILATERAL PELVIC LYMPHADENECTOMY,NO ORTHOTOPIC NEOBLADDER - ILEAL;  Surgeon: Sebastian Ache, MD;   Location: [facility];  Service: Urology;  Laterality: N/A;   TRANSURETHRAL RESECTION OF BLADDER TUMOR N/A 12/11/2017   Procedure: CYSTOSCOPY WITH BILATERAL RETROGRADE PYELOGRAM, TRANSURETHRAL RESECTION OF BLADDER TUMOR (TURBT);  Surgeon: Sebastian Ache, MD;  Location: [facility];  Service: Urology;  Laterality: N/A;   UMBILICAL HERNIA REPAIR  2008   VASECTOMY  1980s   Patient Active Problem List   Diagnosis Date Noted   Dizziness 08/06/2020   TIA (transient ischemic attack) 08/06/2020   History of urostomy- h/o Bladder cancer 08/06/2020   Port-A-Cath in place 02/10/2018   Encounter for antineoplastic chemotherapy 02/03/2018   Bladder cancer (HCC) 12/29/2017   Encounter for screening colonoscopy 03/26/2012   GERD (gastroesophageal reflux disease) 03/26/2012   Elevated bilirubin 05/26/2011   RLQ abdominal pain 05/26/2011    ONSET DATE: ~2 months  REFERRING DIAG: R shoulder Pain  THERAPY DIAG:  Acute pain of right shoulder  Other symptoms and signs involving the musculoskeletal system  Rationale for Evaluation and Treatment: Rehabilitation  SUBJECTIVE:   SUBJECTIVE STATEMENT: "I'm not really sure what I'm here for." Pt accompanied by: self  PERTINENT HISTORY: Pt reporting 1-2 months of shoulder pain, now s/p steroid injection. PMH significant for bladder cancer s/p urostomy.   PRECAUTIONS: None  WEIGHT BEARING RESTRICTIONS: No  PAIN:  Are you having pain? No  FALLS: Has  patient fallen in last 6 months? No  LIVING ENVIRONMENT: Lives with: lives with their family Lives in: House/apartment  PLOF: Independent  PATIENT GOALS: To not have surgery  NEXT MD VISIT: Mid July  OBJECTIVE:   HAND DOMINANCE: Right  ADLs: Overall ADLs: Pt reports no difficulties with ADL's or IADL's, was completing yard work including weed eating and chain sawing this morning. Pt reports mild pain intermittently with certain movements.   UPPER EXTREMITY ROM:     Active ROM  Right eval  Shoulder flexion 164  Shoulder abduction 161  Shoulder internal rotation 90  Shoulder external rotation 65  (Blank rows = not tested)   UPPER EXTREMITY MMT:     MMT Right eval  Shoulder flexion 4+/5  Shoulder abduction 4+/5  Shoulder adduction 5/5  Shoulder extension 5/5  Shoulder internal rotation 4+/5  Shoulder external rotation 4/5  (Blank rows = not tested) SENSATION: WFL  EDEMA: No Swelling Noted  OBSERVATIONS: Mild fascial restrictions along the biceps and trapezius   TODAY'S TREATMENT:                                                                                                                              DATE: 03/10/23: Evaluation only    PATIENT EDUCATION: Education details: Public relations account executive, PNF strengthening, Scapula Strengthening Person educated: Patient Education method: Explanation, Demonstration, and Handouts Education comprehension: verbalized understanding and returned demonstration  HOME EXERCISE PROGRAM: Shoulder strengthening, PNF strengthening, Scapula Strengthening  GOALS: Goals reviewed with patient? Yes  SHORT TERM GOALS: Target date: 03/11/23  Pt will be provided and educated on HEP to improve RUE mobility and strength for ADL completion.  Goal status: INITIAL  ASSESSMENT:  CLINICAL IMPRESSION: Patient is a 82 y.o. male who was seen today for occupational therapy evaluation for R shoulder pain. Pt reports that his pain is not significant, only irritating and nagging at times. After discussion and education on therapy, pt decided that he wanted to attempt completing exercises at home as he does not feel he has any limitations and his pain is intermittent. OT provided pt with an HEP and education to follow up with therapy as needed or if pain/limitations worsened.   PERFORMANCE DEFICITS: in functional skills including strength, pain, and fascial restrictions.  IMPAIRMENTS: are limiting patient from rest and sleep.    COMORBIDITIES: has no other co-morbidities that affects occupational performance. Patient will benefit from skilled OT to address above impairments and improve overall function.  MODIFICATION OR ASSISTANCE TO COMPLETE EVALUATION: No modification of tasks or assist necessary to complete an evaluation.  OT OCCUPATIONAL PROFILE AND HISTORY: Problem focused assessment: Including review of records relating to presenting problem.  CLINICAL DECISION MAKING: LOW - limited treatment options, no task modification necessary  REHAB POTENTIAL: Excellent  EVALUATION COMPLEXITY: Low      PLAN:  OT FREQUENCY: one time visit  OT DURATION: other: Evaluation Only  PLANNED INTERVENTIONS: therapeutic exercise  RECOMMENDED OTHER SERVICES: N/A  CONSULTED AND AGREED WITH PLAN OF CARE: Patient  PLAN FOR NEXT SESSION: N/A  Trish Mage, OTR/L [facility] [phone] Kennyth Arnold, OT 03/10/2023, 5:00 PM

## 2023-04-20 ENCOUNTER — Ambulatory Visit: Payer: PRIVATE HEALTH INSURANCE | Admitting: Orthopedic Surgery

## 2023-04-23 DIAGNOSIS — Z8551 Personal history of malignant neoplasm of bladder: Secondary | ICD-10-CM | POA: Diagnosis not present

## 2023-04-23 DIAGNOSIS — Z936 Other artificial openings of urinary tract status: Secondary | ICD-10-CM | POA: Diagnosis not present

## 2023-05-13 DIAGNOSIS — Z8551 Personal history of malignant neoplasm of bladder: Secondary | ICD-10-CM | POA: Diagnosis not present

## 2023-05-13 DIAGNOSIS — Z936 Other artificial openings of urinary tract status: Secondary | ICD-10-CM | POA: Diagnosis not present

## 2023-07-21 ENCOUNTER — Ambulatory Visit (HOSPITAL_COMMUNITY)
Admission: RE | Admit: 2023-07-21 | Discharge: 2023-07-21 | Disposition: A | Discharge status: Home / Self Care | Payer: Medicare Other | Source: Ambulatory Visit | Attending: Urology | Admitting: Urology

## 2023-07-21 ENCOUNTER — Other Ambulatory Visit (HOSPITAL_COMMUNITY): Payer: Self-pay | Admitting: Urology

## 2023-07-21 DIAGNOSIS — C670 Malignant neoplasm of trigone of bladder: Secondary | ICD-10-CM | POA: Diagnosis not present

## 2023-07-21 DIAGNOSIS — N132 Hydronephrosis with renal and ureteral calculous obstruction: Secondary | ICD-10-CM | POA: Diagnosis not present

## 2023-07-21 DIAGNOSIS — I700 Atherosclerosis of aorta: Secondary | ICD-10-CM | POA: Diagnosis not present

## 2023-07-28 DIAGNOSIS — C670 Malignant neoplasm of trigone of bladder: Secondary | ICD-10-CM | POA: Diagnosis not present

## 2023-08-06 DIAGNOSIS — E782 Mixed hyperlipidemia: Secondary | ICD-10-CM | POA: Diagnosis not present

## 2023-08-06 DIAGNOSIS — R7301 Impaired fasting glucose: Secondary | ICD-10-CM | POA: Diagnosis not present

## 2023-08-12 DIAGNOSIS — Z23 Encounter for immunization: Secondary | ICD-10-CM | POA: Diagnosis not present

## 2023-08-12 DIAGNOSIS — N1832 Chronic kidney disease, stage 3b: Secondary | ICD-10-CM | POA: Diagnosis not present

## 2023-08-12 DIAGNOSIS — R7301 Impaired fasting glucose: Secondary | ICD-10-CM | POA: Diagnosis not present

## 2023-08-12 DIAGNOSIS — R17 Unspecified jaundice: Secondary | ICD-10-CM | POA: Diagnosis not present

## 2023-08-12 DIAGNOSIS — C689 Malignant neoplasm of urinary organ, unspecified: Secondary | ICD-10-CM | POA: Diagnosis not present

## 2023-08-12 DIAGNOSIS — E875 Hyperkalemia: Secondary | ICD-10-CM | POA: Diagnosis not present

## 2023-08-12 DIAGNOSIS — D649 Anemia, unspecified: Secondary | ICD-10-CM | POA: Diagnosis not present

## 2023-08-12 DIAGNOSIS — K219 Gastro-esophageal reflux disease without esophagitis: Secondary | ICD-10-CM | POA: Diagnosis not present

## 2023-08-12 DIAGNOSIS — E782 Mixed hyperlipidemia: Secondary | ICD-10-CM | POA: Diagnosis not present

## 2023-08-12 DIAGNOSIS — H9110 Presbycusis, unspecified ear: Secondary | ICD-10-CM | POA: Diagnosis not present

## 2023-08-12 DIAGNOSIS — R809 Proteinuria, unspecified: Secondary | ICD-10-CM | POA: Diagnosis not present

## 2023-09-07 DIAGNOSIS — H43393 Other vitreous opacities, bilateral: Secondary | ICD-10-CM | POA: Diagnosis not present

## 2023-10-22 ENCOUNTER — Inpatient Hospital Stay (HOSPITAL_COMMUNITY)
Admission: EM | Admit: 2023-10-22 | Discharge: 2023-10-26 | DRG: 388 | Disposition: A | Discharge status: Home / Self Care | Payer: Medicare Other | Attending: Internal Medicine | Admitting: Internal Medicine

## 2023-10-22 ENCOUNTER — Encounter (HOSPITAL_COMMUNITY): Payer: Self-pay | Admitting: Emergency Medicine

## 2023-10-22 ENCOUNTER — Emergency Department (HOSPITAL_COMMUNITY): Payer: Medicare Other

## 2023-10-22 ENCOUNTER — Other Ambulatory Visit: Payer: Self-pay

## 2023-10-22 DIAGNOSIS — E871 Hypo-osmolality and hyponatremia: Secondary | ICD-10-CM | POA: Diagnosis present

## 2023-10-22 DIAGNOSIS — Z87891 Personal history of nicotine dependence: Secondary | ICD-10-CM

## 2023-10-22 DIAGNOSIS — R17 Unspecified jaundice: Secondary | ICD-10-CM

## 2023-10-22 DIAGNOSIS — Z79899 Other long term (current) drug therapy: Secondary | ICD-10-CM | POA: Diagnosis not present

## 2023-10-22 DIAGNOSIS — R112 Nausea with vomiting, unspecified: Secondary | ICD-10-CM | POA: Diagnosis present

## 2023-10-22 DIAGNOSIS — Z87442 Personal history of urinary calculi: Secondary | ICD-10-CM | POA: Diagnosis not present

## 2023-10-22 DIAGNOSIS — N1330 Unspecified hydronephrosis: Secondary | ICD-10-CM | POA: Diagnosis not present

## 2023-10-22 DIAGNOSIS — D751 Secondary polycythemia: Secondary | ICD-10-CM | POA: Diagnosis not present

## 2023-10-22 DIAGNOSIS — Z8551 Personal history of malignant neoplasm of bladder: Secondary | ICD-10-CM

## 2023-10-22 DIAGNOSIS — R739 Hyperglycemia, unspecified: Secondary | ICD-10-CM | POA: Diagnosis present

## 2023-10-22 DIAGNOSIS — K219 Gastro-esophageal reflux disease without esophagitis: Secondary | ICD-10-CM | POA: Diagnosis not present

## 2023-10-22 DIAGNOSIS — D509 Iron deficiency anemia, unspecified: Secondary | ICD-10-CM | POA: Diagnosis present

## 2023-10-22 DIAGNOSIS — R197 Diarrhea, unspecified: Secondary | ICD-10-CM | POA: Diagnosis not present

## 2023-10-22 DIAGNOSIS — E782 Mixed hyperlipidemia: Secondary | ICD-10-CM | POA: Diagnosis not present

## 2023-10-22 DIAGNOSIS — Z888 Allergy status to other drugs, medicaments and biological substances status: Secondary | ICD-10-CM | POA: Diagnosis not present

## 2023-10-22 DIAGNOSIS — K56609 Unspecified intestinal obstruction, unspecified as to partial versus complete obstruction: Principal | ICD-10-CM | POA: Diagnosis present

## 2023-10-22 DIAGNOSIS — R000 Tachycardia, unspecified: Secondary | ICD-10-CM | POA: Diagnosis not present

## 2023-10-22 DIAGNOSIS — N179 Acute kidney failure, unspecified: Principal | ICD-10-CM | POA: Diagnosis present

## 2023-10-22 DIAGNOSIS — N170 Acute kidney failure with tubular necrosis: Secondary | ICD-10-CM | POA: Diagnosis present

## 2023-10-22 DIAGNOSIS — K922 Gastrointestinal hemorrhage, unspecified: Secondary | ICD-10-CM | POA: Diagnosis not present

## 2023-10-22 DIAGNOSIS — Z800 Family history of malignant neoplasm of digestive organs: Secondary | ICD-10-CM | POA: Diagnosis not present

## 2023-10-22 DIAGNOSIS — R0682 Tachypnea, not elsewhere classified: Secondary | ICD-10-CM | POA: Diagnosis not present

## 2023-10-22 DIAGNOSIS — Z7982 Long term (current) use of aspirin: Secondary | ICD-10-CM

## 2023-10-22 DIAGNOSIS — C679 Malignant neoplasm of bladder, unspecified: Secondary | ICD-10-CM | POA: Diagnosis present

## 2023-10-22 DIAGNOSIS — Z974 Presence of external hearing-aid: Secondary | ICD-10-CM | POA: Diagnosis not present

## 2023-10-22 DIAGNOSIS — R140 Abdominal distension (gaseous): Secondary | ICD-10-CM | POA: Diagnosis not present

## 2023-10-22 DIAGNOSIS — N400 Enlarged prostate without lower urinary tract symptoms: Secondary | ICD-10-CM | POA: Diagnosis present

## 2023-10-22 DIAGNOSIS — E876 Hypokalemia: Secondary | ICD-10-CM | POA: Diagnosis not present

## 2023-10-22 LAB — COMPREHENSIVE METABOLIC PANEL
ALT: 25 U/L (ref 0–44)
AST: 38 U/L (ref 15–41)
Albumin: 5.1 g/dL — ABNORMAL HIGH (ref 3.5–5.0)
Alkaline Phosphatase: 106 U/L (ref 38–126)
Anion gap: 22 — ABNORMAL HIGH (ref 5–15)
BUN: 45 mg/dL — ABNORMAL HIGH (ref 8–23)
CO2: 23 mmol/L (ref 22–32)
Calcium: 10.9 mg/dL — ABNORMAL HIGH (ref 8.9–10.3)
Chloride: 86 mmol/L — ABNORMAL LOW (ref 98–111)
Creatinine, Ser: 3.53 mg/dL — ABNORMAL HIGH (ref 0.61–1.24)
GFR, Estimated: 17 mL/min — ABNORMAL LOW (ref >60)
Glucose, Bld: 189 mg/dL — ABNORMAL HIGH (ref 70–99)
Potassium: 4.5 mmol/L (ref 3.5–5.1)
Sodium: 131 mmol/L — ABNORMAL LOW (ref 135–145)
Total Bilirubin: 4.4 mg/dL — ABNORMAL HIGH (ref 0.0–1.2)
Total Protein: 9.3 g/dL — ABNORMAL HIGH (ref 6.5–8.1)

## 2023-10-22 LAB — URINALYSIS, ROUTINE W REFLEX MICROSCOPIC
Bilirubin Urine: NEGATIVE
Glucose, UA: NEGATIVE mg/dL
Ketones, ur: NEGATIVE mg/dL
Nitrite: NEGATIVE
Protein, ur: 100 mg/dL — AB
Specific Gravity, Urine: 1.015 (ref 1.005–1.030)
WBC, UA: >50 WBC/hpf (ref 0–5)
pH: 6 (ref 5.0–8.0)

## 2023-10-22 LAB — CBC
HCT: 52.4 % — ABNORMAL HIGH (ref 39.0–52.0)
Hemoglobin: 17.7 g/dL — ABNORMAL HIGH (ref 13.0–17.0)
MCH: 30.1 pg (ref 26.0–34.0)
MCHC: 33.8 g/dL (ref 30.0–36.0)
MCV: 89 fL (ref 80.0–100.0)
Platelets: 312 10*3/uL (ref 150–400)
RBC: 5.89 MIL/uL — ABNORMAL HIGH (ref 4.22–5.81)
RDW: 14 % (ref 11.5–15.5)
WBC: 10.2 10*3/uL (ref 4.0–10.5)
nRBC: 0 % (ref 0.0–0.2)

## 2023-10-22 LAB — LIPASE, BLOOD: Lipase: 40 U/L (ref 11–51)

## 2023-10-22 MED ORDER — ONDANSETRON HCL 4 MG/2ML IJ SOLN
4.0000 mg | Freq: Once | INTRAMUSCULAR | Status: AC
  Administered 2023-10-22: 4 mg via INTRAVENOUS
  Filled 2023-10-22: qty 2

## 2023-10-22 MED ORDER — SODIUM CHLORIDE 0.9 % IV BOLUS
1000.0000 mL | Freq: Once | INTRAVENOUS | Status: AC
  Administered 2023-10-22: 1000 mL via INTRAVENOUS

## 2023-10-22 MED ORDER — PROCHLORPERAZINE EDISYLATE 10 MG/2ML IJ SOLN
10.0000 mg | Freq: Once | INTRAMUSCULAR | Status: AC
  Administered 2023-10-22: 10 mg via INTRAVENOUS
  Filled 2023-10-22: qty 2

## 2023-10-22 NOTE — ED Provider Notes (Signed)
 [facility] EMERGENCY DEPARTMENT AT [facility] Provider Note   CSN: [id number] Arrival date & time: 10/22/23  1856     History  Chief Complaint  Patient presents with   Diarrhea   Emesis    [name] is a 83 y.o. male.  The history is provided by the patient.  Diarrhea Associated symptoms: vomiting   Emesis Associated symptoms: diarrhea   He has history of TIA, GERD, bladder cancer and comes in because of vomiting and diarrhea.  He started having diarrhea 2 days ago and started vomiting yesterday.  Diarrhea stopped yesterday but he has continued to have vomiting.  His primary care provider did prescribe him a suppository, but he has been vomiting in spite of the suppository.  He has vomited an estimated 6 times today.  He denies abdominal pain, fever, chills, sweats.   Home Medications Prior to Admission medications   Medication Sig Start Date End Date Taking? Authorizing Provider  aspirin  EC 81 MG tablet Take 81 mg by mouth daily. Swallow whole.    [provider]  atorvastatin  (LIPITOR) 80 MG tablet Take 1 tablet (80 mg total) by mouth daily. For stroke Prevention Patient taking differently: Take 80 mg by mouth at bedtime. For stroke Prevention 08/06/20 05/02/22  Pearlean Manus, MD  Multiple Vitamin (MULTIVITAMIN) tablet Take 1 tablet by mouth daily.    [provider]  pantoprazole  (PROTONIX ) 20 MG tablet Take 20 mg by mouth daily. 07/19/20   [provider]  polyethylene glycol-electrolytes (NULYTELY ) 420 g solution As directed 04/02/22   Rourk, Lamar HERO, MD      Allergies    Emend  [fosaprepitant  dimeglumine]    Review of Systems   Review of Systems  Gastrointestinal:  Positive for diarrhea and vomiting.  All other systems reviewed and are negative.   Physical Exam Updated Vital Signs BP 117/86   Pulse (!) 107   Temp 98.4 F (36.9 C) (Oral)   Resp 18   Wt 71.7 kg   SpO2 91%   BMI 24.03 kg/m  Physical Exam Vitals and  nursing note reviewed.   83 year old male, resting comfortably and in no acute distress. Vital signs are significant for elevated heart rate. Oxygen saturation is 91%, which is normal. Head is normocephalic and atraumatic. PERRLA, EOMI. Oropharynx is clear. Neck is nontender and supple without adenopathy or JVD. Lungs are clear without rales, wheezes, or rhonchi. Chest is nontender. Heart has regular rate and rhythm without murmur. Abdomen is soft, flat, nontender.  Ileal conduit present in the right lower quadrant. Extremities have no cyanosis or edema, full range of motion is present. Skin is warm and dry without rash. Neurologic: Mental status is normal, moves all extremities equally.  ED Results / Procedures / Treatments   Labs (all labs ordered are listed, but only abnormal results are displayed) Labs Reviewed  COMPREHENSIVE METABOLIC PANEL - Abnormal; Notable for the following components:      Result Value   Sodium 131 (*)    Chloride 86 (*)    Glucose, Bld 189 (*)    BUN 45 (*)    Creatinine, Ser 3.53 (*)    Calcium  10.9 (*)    Total Protein 9.3 (*)    Albumin 5.1 (*)    Total Bilirubin 4.4 (*)    GFR, Estimated 17 (*)    Anion gap 22 (*)    All other components within normal limits  CBC - Abnormal; Notable for the following components:  RBC 5.89 (*)    Hemoglobin 17.7 (*)    HCT 52.4 (*)    All other components within normal limits  URINALYSIS, ROUTINE W REFLEX MICROSCOPIC - Abnormal; Notable for the following components:   APPearance TURBID (*)    Hgb urine dipstick SMALL (*)    Protein, ur 100 (*)    Leukocytes,Ua MODERATE (*)    Bacteria, UA MANY (*)    All other components within normal limits  LIPASE, BLOOD   Radiology CT ABDOMEN PELVIS WO CONTRAST Result Date: 10/22/2023 CLINICAL DATA:  Abdominal pain and distension with diarrhea, initial encounter EXAM: CT ABDOMEN AND PELVIS WITHOUT CONTRAST TECHNIQUE: Multidetector CT imaging of the abdomen and pelvis  was performed following the standard protocol without IV contrast. RADIATION DOSE REDUCTION: This exam was performed according to the departmental dose-optimization program which includes automated exposure control, adjustment of the mA and/or kV according to patient size and/or use of iterative reconstruction technique. COMPARISON:  07/21/2023 FINDINGS: Lower chest: No acute abnormality. Hepatobiliary: No focal liver abnormality is seen. No gallstones, gallbladder wall thickening, or biliary dilatation. Pancreas: Unremarkable. No pancreatic ductal dilatation or surrounding inflammatory changes. Spleen: Normal in size without focal abnormality. Adrenals/Urinary Tract: Adrenal glands are within normal limits. Chronic hydronephrosis on the right is noted with cortical thinning identified stable from the prior exam. Left kidney appears within normal limits. No renal calculi or obstructive changes are seen. Bladder has been surgically removed as has the prostate. A right lower quadrant ileal conduit is seen. Previously seen peristomal hernia is noted with small bowel although this may represent a portion of the ileal conduit. Stomach/Bowel: No obstructive or inflammatory changes of the colon are seen. The appendix is within normal limits terminal ileum is unremarkable. Multiple fluid-filled dilated loops of small bowel are identified in the jejunum and proximal ileum. Position zone is noted in the left mid abdomen although no mass is seen. This is likely related to postoperative adhesions. The stomach is significantly distended with fluid. Vascular/Lymphatic: Aortic atherosclerosis. No enlarged abdominal or pelvic lymph nodes. Reproductive: Prostate has been surgically removed. Other: No abdominal wall hernia or abnormality. No abdominopelvic ascites. Musculoskeletal: No acute or significant osseous findings. IMPRESSION: Small-bowel obstruction with a transition zone in the left mid abdomen best seen on image number 42  of series 2. The bowel distal to this is within normal limits. Changes consistent with prior cysto prostatectomy with right ileal conduit. The overall appearance is stable. Chronic right hydronephrosis with renal atrophy on the right. Electronically Signed   By: Oneil Devonshire M.D.   On: 10/22/2023 22:58    Procedures Procedures  Cardiac monitor shows sinus tachycardia, per my interpretation.  Medications Ordered in ED Medications  ondansetron  (ZOFRAN ) injection 4 mg (4 mg Intravenous Given 10/22/23 2214)    ED Course/ Medical Decision Making/ A&P                                 Medical Decision Making Amount and/or Complexity of Data Reviewed Labs: ordered.  Risk Prescription drug management. Decision regarding hospitalization.   Nausea, vomiting, diarrhea most likely viral gastroenteritis, possible food poisoning.  Consider bowel obstruction.  Exam is benign but he is tachycardic suggesting some degree of dehydration.  I have reviewed his laboratory tests, and my interpretation is mild hyponatremia which is not felt to be clinically significant, BUN and creatinine significantly elevated compared with baseline consistent with acute kidney injury,  mildly elevated serum calcium  which will need to be observed as he is rehydrated, elevated total bilirubin with normal transaminases, polycythemia likely secondary to dehydration.  I have reviewed his past records, and he has had mildly elevated bilirubin in the past and I suspect elevated bilirubin is from Gilbert's disease.  He will need to be admitted for IV hydration and management of his acute kidney injury.  I have ordered IV fluids, prochlorperazine  for nausea.  CT of abdomen and pelvis showed small bowel obstruction with transition zone in the left mid abdomen.  I have independently viewed the images, and agree with radiologist interpretation.  He has significant gastric distention, I have ordered placement of an NG tube.  I have discussed case  with Dr. Manfred of [facility], who agrees to admit the patient.  Final Clinical Impression(s) / ED Diagnoses Final diagnoses:  Acute kidney injury (nontraumatic) (HCC)  Polycythemia  Hyponatremia  Hypercalcemia  Serum total bilirubin elevated  Small bowel obstruction [facility])    Rx / DC Orders ED Discharge Orders     None         Raford Lenis, MD 10/23/23 [phone]

## 2023-10-22 NOTE — ED Notes (Signed)
 Patient transported to CT

## 2023-10-22 NOTE — ED Triage Notes (Addendum)
 Pt in with abdominal cramping, emesis and diarrhea x 2 days. Pt has hx of bladder CA and has a urostomy bag - denies any fevers. Reporting cramping and poor intake over the past few days, wife states he is pale and weaker than his norm, abdomen distended

## 2023-10-23 ENCOUNTER — Inpatient Hospital Stay (HOSPITAL_COMMUNITY): Payer: Medicare Other

## 2023-10-23 DIAGNOSIS — E782 Mixed hyperlipidemia: Secondary | ICD-10-CM | POA: Diagnosis not present

## 2023-10-23 DIAGNOSIS — E871 Hypo-osmolality and hyponatremia: Secondary | ICD-10-CM | POA: Insufficient documentation

## 2023-10-23 DIAGNOSIS — R112 Nausea with vomiting, unspecified: Secondary | ICD-10-CM | POA: Diagnosis not present

## 2023-10-23 DIAGNOSIS — D751 Secondary polycythemia: Secondary | ICD-10-CM | POA: Diagnosis present

## 2023-10-23 DIAGNOSIS — R197 Diarrhea, unspecified: Secondary | ICD-10-CM | POA: Diagnosis present

## 2023-10-23 DIAGNOSIS — K56609 Unspecified intestinal obstruction, unspecified as to partial versus complete obstruction: Secondary | ICD-10-CM | POA: Diagnosis present

## 2023-10-23 DIAGNOSIS — Z974 Presence of external hearing-aid: Secondary | ICD-10-CM | POA: Diagnosis not present

## 2023-10-23 DIAGNOSIS — Z8551 Personal history of malignant neoplasm of bladder: Secondary | ICD-10-CM | POA: Diagnosis not present

## 2023-10-23 DIAGNOSIS — R140 Abdominal distension (gaseous): Secondary | ICD-10-CM | POA: Diagnosis not present

## 2023-10-23 DIAGNOSIS — Z7982 Long term (current) use of aspirin: Secondary | ICD-10-CM | POA: Diagnosis not present

## 2023-10-23 DIAGNOSIS — R739 Hyperglycemia, unspecified: Secondary | ICD-10-CM | POA: Insufficient documentation

## 2023-10-23 DIAGNOSIS — N170 Acute kidney failure with tubular necrosis: Secondary | ICD-10-CM | POA: Diagnosis present

## 2023-10-23 DIAGNOSIS — Z87442 Personal history of urinary calculi: Secondary | ICD-10-CM | POA: Diagnosis not present

## 2023-10-23 DIAGNOSIS — D509 Iron deficiency anemia, unspecified: Secondary | ICD-10-CM | POA: Diagnosis present

## 2023-10-23 DIAGNOSIS — K922 Gastrointestinal hemorrhage, unspecified: Secondary | ICD-10-CM | POA: Diagnosis not present

## 2023-10-23 DIAGNOSIS — Z888 Allergy status to other drugs, medicaments and biological substances status: Secondary | ICD-10-CM | POA: Diagnosis not present

## 2023-10-23 DIAGNOSIS — N400 Enlarged prostate without lower urinary tract symptoms: Secondary | ICD-10-CM | POA: Diagnosis present

## 2023-10-23 DIAGNOSIS — Z4682 Encounter for fitting and adjustment of non-vascular catheter: Secondary | ICD-10-CM | POA: Diagnosis not present

## 2023-10-23 DIAGNOSIS — R0682 Tachypnea, not elsewhere classified: Secondary | ICD-10-CM | POA: Diagnosis present

## 2023-10-23 DIAGNOSIS — R000 Tachycardia, unspecified: Secondary | ICD-10-CM | POA: Diagnosis present

## 2023-10-23 DIAGNOSIS — Z79899 Other long term (current) drug therapy: Secondary | ICD-10-CM | POA: Diagnosis not present

## 2023-10-23 DIAGNOSIS — K219 Gastro-esophageal reflux disease without esophagitis: Secondary | ICD-10-CM | POA: Diagnosis present

## 2023-10-23 DIAGNOSIS — Z800 Family history of malignant neoplasm of digestive organs: Secondary | ICD-10-CM | POA: Diagnosis not present

## 2023-10-23 DIAGNOSIS — N179 Acute kidney failure, unspecified: Secondary | ICD-10-CM | POA: Diagnosis not present

## 2023-10-23 DIAGNOSIS — E876 Hypokalemia: Secondary | ICD-10-CM | POA: Diagnosis present

## 2023-10-23 DIAGNOSIS — R109 Unspecified abdominal pain: Secondary | ICD-10-CM | POA: Diagnosis not present

## 2023-10-23 DIAGNOSIS — Z87891 Personal history of nicotine dependence: Secondary | ICD-10-CM | POA: Diagnosis not present

## 2023-10-23 LAB — MAGNESIUM: Magnesium: 2.3 mg/dL (ref 1.7–2.4)

## 2023-10-23 LAB — CBC
HCT: 48.9 % (ref 39.0–52.0)
Hemoglobin: 16 g/dL (ref 13.0–17.0)
MCH: 30 pg (ref 26.0–34.0)
MCHC: 32.7 g/dL (ref 30.0–36.0)
MCV: 91.6 fL (ref 80.0–100.0)
Platelets: 235 10*3/uL (ref 150–400)
RBC: 5.34 MIL/uL (ref 4.22–5.81)
RDW: 14.2 % (ref 11.5–15.5)
WBC: 5.6 10*3/uL (ref 4.0–10.5)
nRBC: 0 % (ref 0.0–0.2)

## 2023-10-23 LAB — HEMOGLOBIN A1C
Hgb A1c MFr Bld: 5.3 % (ref 4.8–5.6)
Mean Plasma Glucose: 105.41 mg/dL

## 2023-10-23 LAB — COMPREHENSIVE METABOLIC PANEL
ALT: 25 U/L (ref 0–44)
AST: 38 U/L (ref 15–41)
Albumin: 4.3 g/dL (ref 3.5–5.0)
Alkaline Phosphatase: 87 U/L (ref 38–126)
Anion gap: 18 — ABNORMAL HIGH (ref 5–15)
BUN: 56 mg/dL — ABNORMAL HIGH (ref 8–23)
CO2: 30 mmol/L (ref 22–32)
Calcium: 9.4 mg/dL (ref 8.9–10.3)
Chloride: 87 mmol/L — ABNORMAL LOW (ref 98–111)
Creatinine, Ser: 4.21 mg/dL — ABNORMAL HIGH (ref 0.61–1.24)
GFR, Estimated: 14 mL/min — ABNORMAL LOW (ref >60)
Glucose, Bld: 134 mg/dL — ABNORMAL HIGH (ref 70–99)
Potassium: 4.2 mmol/L (ref 3.5–5.1)
Sodium: 135 mmol/L (ref 135–145)
Total Bilirubin: 3.8 mg/dL — ABNORMAL HIGH (ref 0.0–1.2)
Total Protein: 8.3 g/dL — ABNORMAL HIGH (ref 6.5–8.1)

## 2023-10-23 LAB — BASIC METABOLIC PANEL
Anion gap: 15 (ref 5–15)
BUN: 67 mg/dL — ABNORMAL HIGH (ref 8–23)
CO2: 28 mmol/L (ref 22–32)
Calcium: 8.4 mg/dL — ABNORMAL LOW (ref 8.9–10.3)
Chloride: 94 mmol/L — ABNORMAL LOW (ref 98–111)
Creatinine, Ser: 4.08 mg/dL — ABNORMAL HIGH (ref 0.61–1.24)
GFR, Estimated: 14 mL/min — ABNORMAL LOW (ref >60)
Glucose, Bld: 129 mg/dL — ABNORMAL HIGH (ref 70–99)
Potassium: 4.2 mmol/L (ref 3.5–5.1)
Sodium: 137 mmol/L (ref 135–145)

## 2023-10-23 LAB — PHOSPHORUS: Phosphorus: 4.9 mg/dL — ABNORMAL HIGH (ref 2.5–4.6)

## 2023-10-23 LAB — OSMOLALITY: Osmolality: 313 mosm/kg — ABNORMAL HIGH (ref 275–295)

## 2023-10-23 LAB — OCCULT BLOOD GASTRIC / DUODENUM (SPECIMEN CUP)
Occult Blood, Gastric: POSITIVE — AB
pH, Gastric: 2

## 2023-10-23 LAB — GLUCOSE, CAPILLARY
Glucose-Capillary: 127 mg/dL — ABNORMAL HIGH (ref 70–99)
Glucose-Capillary: 124 mg/dL — ABNORMAL HIGH (ref 70–99)
Glucose-Capillary: 135 mg/dL — ABNORMAL HIGH (ref 70–99)
Glucose-Capillary: 145 mg/dL — ABNORMAL HIGH (ref 70–99)

## 2023-10-23 LAB — OSMOLALITY, URINE: Osmolality, Ur: 327 mosm/kg (ref 300–900)

## 2023-10-23 LAB — SODIUM, URINE, RANDOM: Sodium, Ur: 67 mmol/L

## 2023-10-23 MED ORDER — BISACODYL 10 MG RE SUPP
10.0000 mg | Freq: Every day | RECTAL | Status: DC
Start: 2023-10-23 — End: 2023-10-26
  Filled 2023-10-23 (×3): qty 1

## 2023-10-23 MED ORDER — PANTOPRAZOLE SODIUM 40 MG IV SOLR
40.0000 mg | Freq: Two times a day (BID) | INTRAVENOUS | Status: DC
  Administered 2023-10-23 – 2023-10-26 (×7): 40 mg via INTRAVENOUS
  Filled 2023-10-23 (×7): qty 10

## 2023-10-23 MED ORDER — PHENOL 1.4 % MT LIQD
1.0000 | OROMUCOSAL | Status: DC | PRN
  Administered 2023-10-23: 1 via OROMUCOSAL
  Filled 2023-10-23: qty 177

## 2023-10-23 MED ORDER — PANTOPRAZOLE SODIUM 40 MG IV SOLR: 40.0000 mg | Freq: Two times a day (BID) | INTRAVENOUS | Status: DC

## 2023-10-23 MED ORDER — LACTATED RINGERS IV SOLN: INTRAVENOUS | Status: DC

## 2023-10-23 MED ORDER — SODIUM CHLORIDE 0.9 % IV SOLN: INTRAVENOUS | Status: DC

## 2023-10-23 MED ORDER — SODIUM CHLORIDE 0.9 % IV BOLUS
500.0000 mL | Freq: Once | INTRAVENOUS | Status: AC
  Administered 2023-10-23: 500 mL via INTRAVENOUS

## 2023-10-23 MED ORDER — LORAZEPAM 2 MG/ML IJ SOLN: 0.5000 mg | INTRAMUSCULAR | Status: DC | PRN

## 2023-10-23 MED ORDER — ONDANSETRON HCL 4 MG/2ML IJ SOLN: 4.0000 mg | Freq: Four times a day (QID) | INTRAMUSCULAR | Status: DC | PRN

## 2023-10-23 MED ORDER — ONDANSETRON HCL 4 MG PO TABS: 4.0000 mg | ORAL_TABLET | Freq: Four times a day (QID) | ORAL | Status: DC | PRN

## 2023-10-23 NOTE — TOC CM/SW Note (Signed)
 Transition of Care [facility]) - Inpatient Brief Assessment   Patient Details  Name: [name] MRN: [id number] Date of Birth: [birth year]  Transition of Care [facility]) CM/SW Contact:    Lucie Lunger, LCSWA Phone Number: 10/23/2023, 10:50 AM   Clinical Narrative: Transition of Care Department [facility]) has reviewed patient and no TOC needs have been identified at this time. We will continue to monitor patient advancement through interdisciplinary progression rounds. If new patient transition needs arise, please place a TOC consult.  Transition of Care Asessment: Insurance and Status: Insurance coverage has been reviewed Patient has primary care physician: Yes Home environment has been reviewed: From home Prior level of function:: Independent Prior/Current Home Services: No current home services Social Drivers of Health Review: SDOH reviewed no interventions necessary Readmission risk has been reviewed: Yes Transition of care needs: no transition of care needs at this time

## 2023-10-23 NOTE — Progress Notes (Signed)
 PROGRESS NOTE    [name]  FMW:[id number] DOB: 22-Dec-1940 DOA: 10/22/2023 PCP: Shona Norleen PEDLAR, MD   Brief Narrative:     [name] is a 83 y.o. male with medical history significant of hyperlipidemia, GERD, bladder cancer who presents to the emergency department due to 3 days onset of diarrhea and emesis.  Diarrhea was 3-4 episodes daily, but this resolved 2 days ago.  He endorsed poor oral intake in the last few days and he complained of being weak.  Patient was admitted for small bowel obstruction and is being followed by general surgery.  He will currently require NG tube replacement.  Assessment & Plan:   Principal Problem:   SBO (small bowel obstruction) (HCC) Active Problems:   GERD without esophagitis   Bladder cancer (HCC)   GI bleed   Nausea & vomiting   Hyponatremia   Hypercalcemia   Hyperglycemia   Mixed hyperlipidemia   AKI (acute kidney injury) (HCC)  Assessment and Plan:   Small bowel obstruction NG tube was placed and greater than 1 L of coffee colored fluid was drained prior to patient requesting for the NG tube to be removed due to intolerance Continue NPO at this time except sips and ice chips with plan to advance diet as tolerated Continue IV hydration Continue Zofran  p.r.n. for nausea/vomiting General Surgery will be consulted and we will await further recommendations Positive Gastroccult, continue IV PPI   Nausea and vomiting Continue IV Zofran  p.r.n. Continue IV hydration   Hyponatremia possibly due to dehydration Na 131, continue IV hydration Continue to monitor sodium with serial BMPs Urine osmolality, serum osmolality and urine sodium will be checked   Hypercalcemia  This may be due to multifactorial including being paraneoplastic Calcium  10.9 Continue IV hydration and monitor calcium  level   Hyperglycemia possibly reactive No known history of type 2 diabetes mellitus Hemoglobin A1c will be checked Continue to monitor blood glucose  level   Acute kidney injury BUN/creatinine 45/3.53 (last creatinine for comparison was 3 years ago and it was 1.4), Continue gentle hydration Renally adjust medications, avoid nephrotoxic agents/dehydration/hypotension   Mixed hyperlipidemia Consider starting patient on Lipitor when he resumes oral intake   GERD Continue Protonix    DVT prophylaxis:SCDs Code Status: Full Family Communication: Wife, [name], at bedside Disposition Plan:  Status is: Inpatient Remains inpatient appropriate because: Need for IV medications.  Consultants:  General Surgery  Procedures:  None  Antimicrobials:  None   Subjective: Patient seen and evaluated today with no new acute complaints or concerns. No acute concerns or events noted overnight.  He states he is overall feeling better with no nausea or vomiting.  Objective: Vitals:   10/23/23 0519 10/23/23 0534 10/23/23 0754 10/23/23 1137  BP: 107/70 135/88 120/80 123/81  Pulse: (!) 110 (!) 112 (!) 109 (!) 104  Resp: 20 18 20 20   Temp: 99.1 F (37.3 C) 98.1 F (36.7 C) 99.1 F (37.3 C) 99.3 F (37.4 C)  TempSrc: Oral  Oral Oral  SpO2:  97% 94% 91%  Weight:  69.4 kg      Intake/Output Summary (Last 24 hours) at 10/23/2023 1256 Last data filed at 10/23/2023 0545 Gross per 24 hour  Intake 1500 ml  Output 1100 ml  Net 400 ml   Filed Weights   10/22/23 2003 10/23/23 0534  Weight: 71.7 kg 69.4 kg    Examination:  General exam: Appears calm and comfortable  Respiratory system: Clear to auscultation. Respiratory effort normal. Cardiovascular system: S1 &  S2 heard, RRR.  Gastrointestinal system: Abdomen is soft Central nervous system: Alert and awake Extremities: No edema Skin: No significant lesions noted Psychiatry: Flat affect.    Data Reviewed: I have personally reviewed following labs and imaging studies  CBC: Recent Labs  Lab 10/22/23 2014 10/23/23 0514  WBC 10.2 5.6  HGB 17.7* 16.0  HCT 52.4* 48.9  MCV 89.0 91.6   PLT 312 235   Basic Metabolic Panel: Recent Labs  Lab 10/22/23 2014 10/23/23 0514  NA 131* 135  K 4.5 4.2  CL 86* 87*  CO2 23 30  GLUCOSE 189* 134*  BUN 45* 56*  CREATININE 3.53* 4.21*  CALCIUM  10.9* 9.4  MG  --  2.3  PHOS  --  4.9*   GFR: CrCl cannot be calculated (Unknown ideal weight.). Liver Function Tests: Recent Labs  Lab 10/22/23 2014 10/23/23 0514  AST 38 38  ALT 25 25  ALKPHOS 106 87  BILITOT 4.4* 3.8*  PROT 9.3* 8.3*  ALBUMIN 5.1* 4.3   Recent Labs  Lab 10/22/23 2014  LIPASE 40   No results for input(s): AMMONIA in the last 168 hours. Coagulation Profile: No results for input(s): INR, PROTIME in the last 168 hours. Cardiac Enzymes: No results for input(s): CKTOTAL, CKMB, CKMBINDEX, TROPONINI in the last 168 hours. BNP (last 3 results) No results for input(s): PROBNP in the last 8760 hours. HbA1C: Recent Labs    10/23/23 0514  HGBA1C 5.3   CBG: Recent Labs  Lab 10/23/23 0830 10/23/23 1159  GLUCAP 127* 124*   Lipid Profile: No results for input(s): CHOL, HDL, LDLCALC, TRIG, CHOLHDL, LDLDIRECT in the last 72 hours. Thyroid Function Tests: No results for input(s): TSH, T4TOTAL, FREET4, T3FREE, THYROIDAB in the last 72 hours. Anemia Panel: No results for input(s): VITAMINB12, FOLATE, FERRITIN, TIBC, IRON , RETICCTPCT in the last 72 hours. Sepsis Labs: No results for input(s): PROCALCITON, LATICACIDVEN in the last 168 hours.  No results found for this or any previous visit (from the past 240 hours).       Radiology Studies: DG Abd 2 Views Result Date: 10/23/2023 CLINICAL DATA:  83 year old male with abdominal pain, distension, small-bowel obstruction. EXAM: ABDOMEN - 2 VIEW COMPARISON:  CT Abdomen and Pelvis 10/22/2023. FINDINGS: Upright and supine AP views of the abdomen and pelvis 1050 hours. Improved small-bowel gas pattern since yesterday although not completely normalized.  Residual air-fluid levels in the bowel. Evidence of some small bowel wall thickening in the left abdomen. Volume of large bowel gas not significantly changed. Stable visualized osseous structures. Stable lung bases. No pneumoperitoneum. IMPRESSION: Improved but not normalized bowel gas pattern since the CT yesterday, suggesting regression of small bowel obstruction. No pneumoperitoneum. Electronically Signed   By: VEAR Hurst M.D.   On: 10/23/2023 12:08   DG Abd 1 View Result Date: 10/23/2023 CLINICAL DATA:  Check gastric catheter placement EXAM: ABDOMEN - 1 VIEW COMPARISON:  None Available. FINDINGS: Gastric catheter is noted with the tip in the stomach. Proximal side port lies in the distal esophagus. This should be advanced deeper into the stomach. Persistent small bowel dilatation is noted. IMPRESSION: Gastric catheter as described. This should be advanced deeper into the stomach. Electronically Signed   By: Oneil Devonshire M.D.   On: 10/23/2023 01:57   CT ABDOMEN PELVIS WO CONTRAST Result Date: 10/22/2023 CLINICAL DATA:  Abdominal pain and distension with diarrhea, initial encounter EXAM: CT ABDOMEN AND PELVIS WITHOUT CONTRAST TECHNIQUE: Multidetector CT imaging of the abdomen and pelvis was performed following  the standard protocol without IV contrast. RADIATION DOSE REDUCTION: This exam was performed according to the departmental dose-optimization program which includes automated exposure control, adjustment of the mA and/or kV according to patient size and/or use of iterative reconstruction technique. COMPARISON:  07/21/2023 FINDINGS: Lower chest: No acute abnormality. Hepatobiliary: No focal liver abnormality is seen. No gallstones, gallbladder wall thickening, or biliary dilatation. Pancreas: Unremarkable. No pancreatic ductal dilatation or surrounding inflammatory changes. Spleen: Normal in size without focal abnormality. Adrenals/Urinary Tract: Adrenal glands are within normal limits. Chronic  hydronephrosis on the right is noted with cortical thinning identified stable from the prior exam. Left kidney appears within normal limits. No renal calculi or obstructive changes are seen. Bladder has been surgically removed as has the prostate. A right lower quadrant ileal conduit is seen. Previously seen peristomal hernia is noted with small bowel although this may represent a portion of the ileal conduit. Stomach/Bowel: No obstructive or inflammatory changes of the colon are seen. The appendix is within normal limits terminal ileum is unremarkable. Multiple fluid-filled dilated loops of small bowel are identified in the jejunum and proximal ileum. Position zone is noted in the left mid abdomen although no mass is seen. This is likely related to postoperative adhesions. The stomach is significantly distended with fluid. Vascular/Lymphatic: Aortic atherosclerosis. No enlarged abdominal or pelvic lymph nodes. Reproductive: Prostate has been surgically removed. Other: No abdominal wall hernia or abnormality. No abdominopelvic ascites. Musculoskeletal: No acute or significant osseous findings. IMPRESSION: Small-bowel obstruction with a transition zone in the left mid abdomen best seen on image number 42 of series 2. The bowel distal to this is within normal limits. Changes consistent with prior cysto prostatectomy with right ileal conduit. The overall appearance is stable. Chronic right hydronephrosis with renal atrophy on the right. Electronically Signed   By: Oneil Devonshire M.D.   On: 10/22/2023 22:58        Scheduled Meds:  bisacodyl   10 mg Rectal Daily   pantoprazole  (PROTONIX ) IV  40 mg Intravenous Q12H   Continuous Infusions:  sodium chloride  125 mL/hr at 10/23/23 1022     LOS: 0 days    Time spent: 55 minutes    Aliea Bobe JONETTA Fairly, DO [facility]  If 7PM-7AM, please contact night-coverage [email or web address] 10/23/2023, 12:56 PM

## 2023-10-23 NOTE — H&P (Signed)
 History and Physical    Patient: [name] FMW:[id number] DOB: 12/05/1940 DOA: 10/22/2023 DOS: the patient was seen and examined on 10/23/2023 PCP: Shona Norleen PEDLAR, MD  Patient coming from: Home  Chief Complaint:  Chief Complaint  Patient presents with   Diarrhea   Emesis   HPI: [name] is a 83 y.o. male with medical history significant of hyperlipidemia, GERD, bladder cancer who presents to the emergency department due to 3 days onset of diarrhea and emesis.  Diarrhea was 3-4 episodes daily, but this resolved 2 days ago.  He endorsed poor oral intake in the last few days and he complained of being weak.  Wife states he was more pale than normal per ED medical record.  He states that he went to his PCP who prescribed suppository for the vomiting, however, he continued to vomit up to 6 episodes daily.  Patient denies fever, abdominal pain, chills, sweats.  ED Course:  In the emergency department, patient was tachypneic, tachycardic, BP was 92/72, other vital signs were within normal range.  Workup in the ED showed WBC 10.2, hemoglobin 17.7, hematocrit 50.4, MCV 89.0, platelets 312.  BMP showed sodium 131, potassium 4.5, chloride 86, bicarb 23, blood glucose 189, BUN/creatinine 45/3.53 (last creatinine for comparison was 3 years ago and it was 1.4), calcium  10.9, albumin 5.1, total bilirubin 4.4, EGFR was 17, anion gap 22, glucose blood gastric was positive, urinalysis showed moderate leukocytes, greater than 50 WBC and many bacteria, lipase 40. CT abdomen and pelvis without contrast showed small bowel obstruction NG tube was placed and greater than 1 L of coffee-ground fluid was obtained.  Though, patient eventually requested for the NG tube to be removed. Zofran  and Compazine  were given, IV hydration was provided.  Review of Systems: Review of systems as noted in the HPI. All other systems reviewed and are negative.   Past Medical History:  Diagnosis Date   Bladder cancer [facility]) dx'd  12/2017   urologist-  dr alvaro   BPH (benign prostatic hyperplasia)    Full dentures    GERD (gastroesophageal reflux disease)    Hiatal hernia    History of bladder stone    History of kidney stones    per pt hx passed stones spontenaously   Hypercholesterolemia    Wears glasses    Wears hearing aid in both ears    Past Surgical History:  Procedure Laterality Date   COLONOSCOPY WITH ESOPHAGOGASTRODUODENOSCOPY (EGD)  04-08-2012  dr shaaron   COLONOSCOPY WITH PROPOFOL  N/A 05/02/2022   Procedure: COLONOSCOPY WITH PROPOFOL ;  Surgeon: Shaaron Lamar HERO, MD;  Location: [facility];  Service: Endoscopy;  Laterality: N/A;  8:15am, asa 2   CYSTO/  BLADDER STONE EXTRACTIONS  1998 approx.   CYSTOSCOPY WITH INJECTION N/A 06/11/2018   Procedure: CYSTOSCOPY WITH INJECTION;  Surgeon: Alvaro Hummer, MD;  Location: [facility];  Service: Urology;  Laterality: N/A;   IR FLUORO GUIDE PORT INSERTION RIGHT  01/07/2018   IR REMOVAL TUN ACCESS W/ PORT W/O FL MOD SED  04/05/2019   IR US  GUIDE VASC ACCESS RIGHT  01/07/2018   ROBOT ASSISTED LAPAROSCOPIC RADICAL PROSTATECTOMY N/A 06/11/2018   Procedure: XI ROBOT ASSISTED LAPAROSCOPIC RADICAL CYSTOPROSTATECTOMY BILATERAL PELVIC LYMPHADENECTOMY,NO ORTHOTOPIC NEOBLADDER - ILEAL;  Surgeon: Alvaro Hummer, MD;  Location: [facility];  Service: Urology;  Laterality: N/A;   TRANSURETHRAL RESECTION OF BLADDER TUMOR N/A 12/11/2017   Procedure: CYSTOSCOPY WITH BILATERAL RETROGRADE PYELOGRAM, TRANSURETHRAL RESECTION OF BLADDER TUMOR (TURBT);  Surgeon: Alvaro Hummer,  MD;  Location: [facility];  Service: Urology;  Laterality: N/A;   UMBILICAL HERNIA REPAIR  2008   VASECTOMY  1980s    Social History:  reports that he quit smoking about 25 years ago. His smoking use included cigarettes. He started smoking about 45 years ago. He has a 10 pack-year smoking history. He has never used smokeless tobacco. He reports current alcohol use. He reports that he does not use  drugs.   Allergies  Allergen Reactions   Emend  [Fosaprepitant  Dimeglumine] Cough    Reaction during infusion with cough, erythema of face and neck.      Family History  Problem Relation Age of Onset   Colon cancer Paternal Grandfather      Prior to Admission medications   Medication Sig Start Date End Date Taking? Authorizing Provider  aspirin  EC 81 MG tablet Take 81 mg by mouth daily. Swallow whole.    [provider]  atorvastatin  (LIPITOR) 80 MG tablet Take 1 tablet (80 mg total) by mouth daily. For stroke Prevention Patient taking differently: Take 80 mg by mouth at bedtime. For stroke Prevention 08/06/20 05/02/22  Pearlean Manus, MD  Multiple Vitamin (MULTIVITAMIN) tablet Take 1 tablet by mouth daily.    [provider]  pantoprazole  (PROTONIX ) 20 MG tablet Take 20 mg by mouth daily. 07/19/20   [provider]  polyethylene glycol-electrolytes (NULYTELY ) 420 g solution As directed 04/02/22   Rourk, Lamar HERO, MD    Physical Exam: BP 104/64   Pulse (!) 109   Temp 98.4 F (36.9 C) (Oral)   Resp 17   Wt 71.7 kg   SpO2 94%   BMI 24.03 kg/m   General: 83 y.o. year-old male well developed well nourished in no acute distress.  Alert and oriented x3. HEENT: NCAT, EOMI, HOH, dry mucous membrane Neck: Supple, trachea medial Cardiovascular: Regular rate and rhythm with no rubs or gallops.  No thyromegaly or JVD noted.  No lower extremity edema. 2/4 pulses in all 4 extremities. Respiratory: Clear to auscultation with no wheezes or rales. Good inspiratory effort. Abdomen: Soft, nontender nondistended with normal bowel sounds x4 quadrants.  Noted ileal conduit in RLQ. Muskuloskeletal: No cyanosis, clubbing or edema noted bilaterally Neuro: CN II-XII intact, strength 5/5 x 4, sensation, reflexes intact Skin: No ulcerative lesions noted or rashes Psychiatry: Judgement and insight appear normal. Mood is appropriate for condition and setting          Labs  on Admission:  Basic Metabolic Panel: Recent Labs  Lab 10/22/23 2014  NA 131*  K 4.5  CL 86*  CO2 23  GLUCOSE 189*  BUN 45*  CREATININE 3.53*  CALCIUM  10.9*   Liver Function Tests: Recent Labs  Lab 10/22/23 2014  AST 38  ALT 25  ALKPHOS 106  BILITOT 4.4*  PROT 9.3*  ALBUMIN 5.1*   Recent Labs  Lab 10/22/23 2014  LIPASE 40   No results for input(s): AMMONIA in the last 168 hours. CBC: Recent Labs  Lab 10/22/23 2014  WBC 10.2  HGB 17.7*  HCT 52.4*  MCV 89.0  PLT 312   Cardiac Enzymes: No results for input(s): CKTOTAL, CKMB, CKMBINDEX, TROPONINI in the last 168 hours.  BNP (last 3 results) No results for input(s): BNP in the last 8760 hours.  ProBNP (last 3 results) No results for input(s): PROBNP in the last 8760 hours.  CBG: No results for input(s): GLUCAP in the last 168 hours.  Radiological Exams on Admission: DG  Abd 1 View Result Date: 10/23/2023 CLINICAL DATA:  Check gastric catheter placement EXAM: ABDOMEN - 1 VIEW COMPARISON:  None Available. FINDINGS: Gastric catheter is noted with the tip in the stomach. Proximal side port lies in the distal esophagus. This should be advanced deeper into the stomach. Persistent small bowel dilatation is noted. IMPRESSION: Gastric catheter as described. This should be advanced deeper into the stomach. Electronically Signed   By: Oneil Devonshire M.D.   On: 10/23/2023 01:57   CT ABDOMEN PELVIS WO CONTRAST Result Date: 10/22/2023 CLINICAL DATA:  Abdominal pain and distension with diarrhea, initial encounter EXAM: CT ABDOMEN AND PELVIS WITHOUT CONTRAST TECHNIQUE: Multidetector CT imaging of the abdomen and pelvis was performed following the standard protocol without IV contrast. RADIATION DOSE REDUCTION: This exam was performed according to the departmental dose-optimization program which includes automated exposure control, adjustment of the mA and/or kV according to patient size and/or use of iterative  reconstruction technique. COMPARISON:  07/21/2023 FINDINGS: Lower chest: No acute abnormality. Hepatobiliary: No focal liver abnormality is seen. No gallstones, gallbladder wall thickening, or biliary dilatation. Pancreas: Unremarkable. No pancreatic ductal dilatation or surrounding inflammatory changes. Spleen: Normal in size without focal abnormality. Adrenals/Urinary Tract: Adrenal glands are within normal limits. Chronic hydronephrosis on the right is noted with cortical thinning identified stable from the prior exam. Left kidney appears within normal limits. No renal calculi or obstructive changes are seen. Bladder has been surgically removed as has the prostate. A right lower quadrant ileal conduit is seen. Previously seen peristomal hernia is noted with small bowel although this may represent a portion of the ileal conduit. Stomach/Bowel: No obstructive or inflammatory changes of the colon are seen. The appendix is within normal limits terminal ileum is unremarkable. Multiple fluid-filled dilated loops of small bowel are identified in the jejunum and proximal ileum. Position zone is noted in the left mid abdomen although no mass is seen. This is likely related to postoperative adhesions. The stomach is significantly distended with fluid. Vascular/Lymphatic: Aortic atherosclerosis. No enlarged abdominal or pelvic lymph nodes. Reproductive: Prostate has been surgically removed. Other: No abdominal wall hernia or abnormality. No abdominopelvic ascites. Musculoskeletal: No acute or significant osseous findings. IMPRESSION: Small-bowel obstruction with a transition zone in the left mid abdomen best seen on image number 42 of series 2. The bowel distal to this is within normal limits. Changes consistent with prior cysto prostatectomy with right ileal conduit. The overall appearance is stable. Chronic right hydronephrosis with renal atrophy on the right. Electronically Signed   By: Oneil Devonshire M.D.   On: 10/22/2023  22:58    EKG: I independently viewed the EKG done and my findings are as followed: Sinus tachycardia at a rate of 109 bpm  Assessment/Plan Present on Admission:  SBO (small bowel obstruction) (HCC)  Bladder cancer (HCC)  GERD without esophagitis  Principal Problem:   SBO (small bowel obstruction) (HCC) Active Problems:   GERD without esophagitis   Bladder cancer (HCC)   GI bleed   Nausea & vomiting   Hyponatremia   Hypercalcemia   Hyperglycemia   Mixed hyperlipidemia   AKI (acute kidney injury) (HCC)  Small bowel obstruction NG tube was placed and greater than 1 L of coffee colored fluid was drained prior to patient requesting for the NG tube to be removed due to intolerance Continue NPO at this time with plan to advance diet as tolerated Continue IV hydration Continue Zofran  p.r.n. for nausea/vomiting General Surgery will be consulted and we will await  further recommendations  GI bleed H/H= 17.7/52.4, this was 13.5/41.6 on 08/06/2020 Gastric occult was positive Continue IV Protonix   Gastroenterologist will be consulted and we shall await further recommendation  Nausea and vomiting Continue IV Zofran  p.r.n. Continue IV hydration  Hyponatremia possibly due to dehydration Na 131, continue IV hydration Continue to monitor sodium with serial BMPs Urine osmolality, serum osmolality and urine sodium will be checked  Hypercalcemia  This may be due to multifactorial including being paraneoplastic Calcium  10.9 Continue IV hydration and monitor calcium  level  Hyperglycemia possibly reactive No known history of type 2 diabetes mellitus Hemoglobin A1c will be checked Continue to monitor blood glucose level  Acute kidney injury BUN/creatinine 45/3.53 (last creatinine for comparison was 3 years ago and it was 1.4), Continue gentle hydration Renally adjust medications, avoid nephrotoxic agents/dehydration/hypotension  Mixed hyperlipidemia Consider starting patient on  Lipitor when he resumes oral intake  GERD Continue Protonix    DVT prophylaxis: SCDs  Code Status: Full code  Family Communication: None at bedside  Consults: Gastroenterology  Severity of Illness: The appropriate patient status for this patient is INPATIENT. Inpatient status is judged to be reasonable and necessary in order to provide the required intensity of service to ensure the patient's safety. The patient's presenting symptoms, physical exam findings, and initial radiographic and laboratory data in the context of their chronic comorbidities is felt to place them at high risk for further clinical deterioration. Furthermore, it is not anticipated that the patient will be medically stable for discharge from the hospital within 2 midnights of admission.   * I certify that at the point of admission it is my clinical judgment that the patient will require inpatient hospital care spanning beyond 2 midnights from the point of admission due to high intensity of service, high risk for further deterioration and high frequency of surveillance required.*  Author: Ronin Rehfeldt, DO 10/23/2023 5:13 AM  For on call review [email or web address].

## 2023-10-23 NOTE — Consult Note (Signed)
 [facility] Consult  Reason for Consult: Small bowel obstruction Referring Physician: Dr. Manfred  Chief Complaint   Diarrhea; Emesis     HPI: [name] is a 83 y.o. male who presents with a 2 to 3-day history of nausea, vomiting, and diarrhea.  Starting on Tuesday, he was having a lot of burping.  The burping then progressed to him feeling nauseous with vomiting on Wednesday.  He also has been having some episodes of diarrhea.  He complains of some abdominal cramping and feels that his abdomen is a little bloated, but denies significant abdominal pain.  He denies ever having symptoms like this in the past.  He last had a bowel movement this morning, it was a loose bowel movement.  He last passed flatus earlier today, though has been passing less flatus.  He has a past medical history significant for bladder cancer, GERD, and hyperlipidemia.  He denies use of blood thinning medications.  His surgical history significant for an umbilical hernia repair many years ago, TURP, and robotic radical cystoprostatectomy with creation of an ileal conduit.  In the ED, he was noted to have an AKI with and elevated creatinine.  He had no leukocytosis.  He underwent a CT of the abdomen and pelvis, which demonstrated concern for small bowel obstruction with a transition zone in the left mid abdomen with decompressed distal small bowel.  He had an NG tube placed yesterday at which time a liter of coffee-ground output was decompressed, though he could not tolerate NG tube being in place and asked for it to be removed.  He underwent a KUB this morning which demonstrates improved but not normal bowel gas pattern, suggesting regression of small bowel obstruction.  This morning, the patient states that he is feeling better.  He had a loose bowel movement this morning and passed flatus this morning as well.  He denies nausea and vomiting currently.  Denies abdominal pain.  Past Medical History:   Diagnosis Date   Bladder cancer [facility]) dx'd 12/2017   urologist-  dr alvaro   BPH (benign prostatic hyperplasia)    Full dentures    GERD (gastroesophageal reflux disease)    Hiatal hernia    History of bladder stone    History of kidney stones    per pt hx passed stones spontenaously   Hypercholesterolemia    Wears glasses    Wears hearing aid in both ears     Past Surgical History:  Procedure Laterality Date   COLONOSCOPY WITH ESOPHAGOGASTRODUODENOSCOPY (EGD)  04-08-2012  dr shaaron   COLONOSCOPY WITH PROPOFOL  N/A 05/02/2022   Procedure: COLONOSCOPY WITH PROPOFOL ;  Surgeon: Shaaron Lamar HERO, MD;  [facility];  Service: Endoscopy;  Laterality: N/A;  8:15am, asa 2   CYSTO/  BLADDER STONE EXTRACTIONS  1998 approx.   CYSTOSCOPY WITH INJECTION N/A 06/11/2018   Procedure: CYSTOSCOPY WITH INJECTION;  Surgeon: Alvaro Hummer, MD;  Location: [facility];  Service: Urology;  Laterality: N/A;   IR FLUORO GUIDE PORT INSERTION RIGHT  01/07/2018   IR REMOVAL TUN ACCESS W/ PORT W/O FL MOD SED  04/05/2019   IR US  GUIDE VASC ACCESS RIGHT  01/07/2018   ROBOT ASSISTED LAPAROSCOPIC RADICAL PROSTATECTOMY N/A 06/11/2018   Procedure: XI ROBOT ASSISTED LAPAROSCOPIC RADICAL CYSTOPROSTATECTOMY BILATERAL PELVIC LYMPHADENECTOMY,NO ORTHOTOPIC NEOBLADDER - ILEAL;  Surgeon: Alvaro Hummer, MD;  Location: [facility];  Service: Urology;  Laterality: N/A;   TRANSURETHRAL RESECTION OF BLADDER TUMOR N/A 12/11/2017   Procedure: CYSTOSCOPY WITH  BILATERAL RETROGRADE PYELOGRAM, TRANSURETHRAL RESECTION OF BLADDER TUMOR (TURBT);  Surgeon: Alvaro Hummer, MD;  Location: [facility];  Service: Urology;  Laterality: N/A;   UMBILICAL HERNIA REPAIR  2008   VASECTOMY  1980s    Family History  Problem Relation Age of Onset   Colon cancer Paternal Grandfather     Social History   Tobacco Use   Smoking status: Former    Current packs/day: 0.00    Average packs/day: 0.5 packs/day for 20.0 years (10.0 ttl pk-yrs)     Types: Cigarettes    Start date: 03/26/1978    Quit date: 03/26/1998    Years since quitting: 25.5   Smokeless tobacco: Never  Vaping Use   Vaping status: Never Used  Substance Use Topics   Alcohol use: Yes    Comment: occasional wine, beer   Drug use: No    Medications: I have reviewed the patient's current medications.  Allergies  Allergen Reactions   Emend  [Fosaprepitant  Dimeglumine] Cough    Reaction during infusion with cough, erythema of face and neck.       ROS:  Pertinent items are noted in HPI.  Blood pressure 123/81, pulse (!) 104, temperature 99.3 F (37.4 C), temperature source Oral, resp. rate 20, weight 69.4 kg, SpO2 91%. Physical Exam Vitals reviewed.  Constitutional:      Appearance: Normal appearance.  HENT:     Head: Normocephalic and atraumatic.  Eyes:     Extraocular Movements: Extraocular movements intact.     Pupils: Pupils are equal, round, and reactive to light.  Cardiovascular:     Rate and Rhythm: Normal rate.  Pulmonary:     Effort: Pulmonary effort is normal.  Abdominal:     Comments: Abdomen soft, moderate distention, no percussion tenderness, nontender to palpation; no rigidity, guarding, rebound tenderness; urostomy in right side of abdomen pink with urine in bag  Musculoskeletal:        General: Normal range of motion.     Cervical back: Normal range of motion.  Skin:    General: Skin is warm and dry.  Neurological:     General: No focal deficit present.     Mental Status: He is alert and oriented to person, place, and time.  Psychiatric:        Mood and Affect: Mood normal.        Behavior: Behavior normal.     Results: Results for orders placed or performed during the hospital encounter of 10/22/23 (from the past 48 hours)  Lipase, blood     Status: None   Collection Time: 10/22/23  8:14 PM  Result Value Ref Range   Lipase 40 11 - 51 U/L    Comment: Performed at [facility], [address], [place], [place] [address]   Comprehensive metabolic panel     Status: Abnormal   Collection Time: 10/22/23  8:14 PM  Result Value Ref Range   Sodium 131 (L) 135 - 145 mmol/L   Potassium 4.5 3.5 - 5.1 mmol/L   Chloride 86 (L) 98 - 111 mmol/L   CO2 23 22 - 32 mmol/L   Glucose, Bld 189 (H) 70 - 99 mg/dL    Comment: Glucose reference range applies only to samples taken after fasting for at least 8 hours.   BUN 45 (H) 8 - 23 mg/dL   Creatinine, Ser 6.46 (H) 0.61 - 1.24 mg/dL   Calcium  10.9 (H) 8.9 - 10.3 mg/dL   Total Protein 9.3 (H) 6.5 -  8.1 g/dL   Albumin 5.1 (H) 3.5 - 5.0 g/dL   AST 38 15 - 41 U/L   ALT 25 0 - 44 U/L   Alkaline Phosphatase 106 38 - 126 U/L   Total Bilirubin 4.4 (H) 0.0 - 1.2 mg/dL   GFR, Estimated 17 (L) >60 mL/min    Comment: (NOTE) Calculated using the CKD-EPI Creatinine Equation (2021)    Anion gap 22 (H) 5 - 15    Comment: Electrolytes repeated to confirm. Electrolytes repeated to confirm. Performed at [facility], [address], [place], [place] [address]   CBC     Status: Abnormal   Collection Time: 10/22/23  8:14 PM  Result Value Ref Range   WBC 10.2 4.0 - 10.5 K/uL   RBC 5.89 (H) 4.22 - 5.81 MIL/uL   Hemoglobin 17.7 (H) 13.0 - 17.0 g/dL   HCT 47.5 (H) 60.9 - 47.9 %   MCV 89.0 80.0 - 100.0 fL   MCH 30.1 26.0 - 34.0 pg   MCHC 33.8 30.0 - 36.0 g/dL   RDW 85.9 88.4 - 84.4 %   Platelets 312 150 - 400 K/uL   nRBC 0.0 0.0 - 0.2 %    Comment: Performed at [facility], [address], [place], [place] [address]  Urinalysis, Routine w reflex microscopic -Urine, Clean Catch     Status: Abnormal   Collection Time: 10/22/23 10:13 PM  Result Value Ref Range   Color, Urine YELLOW YELLOW   APPearance TURBID (A) CLEAR   Specific Gravity, Urine 1.015 1.005 - 1.030   pH 6.0 5.0 - 8.0   Glucose, UA NEGATIVE NEGATIVE mg/dL   Hgb urine dipstick SMALL (A) NEGATIVE   Bilirubin Urine NEGATIVE NEGATIVE   Ketones, ur NEGATIVE NEGATIVE mg/dL   Protein, ur 899 (A) NEGATIVE mg/dL   Nitrite  NEGATIVE NEGATIVE   Leukocytes,Ua MODERATE (A) NEGATIVE   RBC / HPF 0-5 0 - 5 RBC/hpf   WBC, UA >50 0 - 5 WBC/hpf   Bacteria, UA MANY (A) NONE SEEN   Squamous Epithelial / HPF 0-5 0 - 5 /HPF   WBC Clumps PRESENT     Comment: Performed at [facility], [address] [place], [place] [address]  Occult bld gastric/duodenum (cup to lab)     Status: Abnormal   Collection Time: 10/23/23  2:35 AM  Result Value Ref Range   pH, Gastric 2    Occult Blood, Gastric POSITIVE (A) NEGATIVE    Comment: Performed at [facility], [address], [place], [place] [address]  Sodium, urine, random     Status: None   Collection Time: 10/23/23  4:48 AM  Result Value Ref Range   Sodium, Ur 67 mmol/L    Comment: Performed at [facility], [address], [place], [place] [address]  Comprehensive metabolic panel     Status: Abnormal   Collection Time: 10/23/23  5:14 AM  Result Value Ref Range   Sodium 135 135 - 145 mmol/L   Potassium 4.2 3.5 - 5.1 mmol/L   Chloride 87 (L) 98 - 111 mmol/L   CO2 30 22 - 32 mmol/L   Glucose, Bld 134 (H) 70 - 99 mg/dL    Comment: Glucose reference range applies only to samples taken after fasting for at least 8 hours.   BUN 56 (H) 8 - 23 mg/dL   Creatinine, Ser 5.78 (H) 0.61 - 1.24 mg/dL   Calcium  9.4 8.9 - 10.3 mg/dL   Total Protein 8.3 (H) 6.5 -  8.1 g/dL   Albumin 4.3 3.5 - 5.0 g/dL   AST 38 15 - 41 U/L   ALT 25 0 - 44 U/L   Alkaline Phosphatase 87 38 - 126 U/L   Total Bilirubin 3.8 (H) 0.0 - 1.2 mg/dL   GFR, Estimated 14 (L) >60 mL/min    Comment: (NOTE) Calculated using the CKD-EPI Creatinine Equation (2021)    Anion gap 18 (H) 5 - 15    Comment: Performed at [facility], [address] [place], [place] [address]  CBC     Status: None   Collection Time: 10/23/23  5:14 AM  Result Value Ref Range   WBC 5.6 4.0 - 10.5 K/uL   RBC 5.34 4.22 - 5.81 MIL/uL   Hemoglobin 16.0 13.0 - 17.0 g/dL   HCT 51.0 60.9 - 47.9 %   MCV 91.6 80.0 - 100.0 fL   MCH 30.0 26.0 - 34.0  pg   MCHC 32.7 30.0 - 36.0 g/dL   RDW 85.7 88.4 - 84.4 %   Platelets 235 150 - 400 K/uL   nRBC 0.0 0.0 - 0.2 %    Comment: Performed at [facility], [address] [place], [place] [address]  Magnesium      Status: None   Collection Time: 10/23/23  5:14 AM  Result Value Ref Range   Magnesium  2.3 1.7 - 2.4 mg/dL    Comment: Performed at [facility], [address], [place], [place] [address]  Phosphorus     Status: Abnormal   Collection Time: 10/23/23  5:14 AM  Result Value Ref Range   Phosphorus 4.9 (H) 2.5 - 4.6 mg/dL    Comment: Performed at [facility], [address], [place], [place] [address]  Osmolality     Status: Abnormal   Collection Time: 10/23/23  5:14 AM  Result Value Ref Range   Osmolality 313 (H) 275 - 295 mOsm/kg    Comment: Performed at [facility], [address] [place], [place] [address]  Hemoglobin A1c     Status: None   Collection Time: 10/23/23  5:14 AM  Result Value Ref Range   Hgb A1c MFr Bld 5.3 4.8 - 5.6 %    Comment: (NOTE) Pre diabetes:          5.7%-6.4%  Diabetes:              >6.4%  Glycemic control for   <7.0% adults with diabetes    Mean Plasma Glucose 105.41 mg/dL    Comment: Performed at [facility], [address], [place], [place] [address]  Glucose, capillary     Status: Abnormal   Collection Time: 10/23/23  8:30 AM  Result Value Ref Range   Glucose-Capillary 127 (H) 70 - 99 mg/dL    Comment: Glucose reference range applies only to samples taken after fasting for at least 8 hours.  Glucose, capillary     Status: Abnormal   Collection Time: 10/23/23 11:59 AM  Result Value Ref Range   Glucose-Capillary 124 (H) 70 - 99 mg/dL    Comment: Glucose reference range applies only to samples taken after fasting for at least 8 hours.    DG Abd 2 Views Result Date: 10/23/2023 CLINICAL DATA:  83 year old male with abdominal pain, distension, small-bowel obstruction. EXAM: ABDOMEN - 2 VIEW COMPARISON:  CT Abdomen and Pelvis  10/22/2023. FINDINGS: Upright and supine AP views of the abdomen and pelvis 1050 hours. Improved small-bowel gas pattern since yesterday although not completely normalized.  Residual air-fluid levels in the bowel. Evidence of some small bowel wall thickening in the left abdomen. Volume of large bowel gas not significantly changed. Stable visualized osseous structures. Stable lung bases. No pneumoperitoneum. IMPRESSION: Improved but not normalized bowel gas pattern since the CT yesterday, suggesting regression of small bowel obstruction. No pneumoperitoneum. Electronically Signed   By: VEAR Hurst M.D.   On: 10/23/2023 12:08   DG Abd 1 View Result Date: 10/23/2023 CLINICAL DATA:  Check gastric catheter placement EXAM: ABDOMEN - 1 VIEW COMPARISON:  None Available. FINDINGS: Gastric catheter is noted with the tip in the stomach. Proximal side port lies in the distal esophagus. This should be advanced deeper into the stomach. Persistent small bowel dilatation is noted. IMPRESSION: Gastric catheter as described. This should be advanced deeper into the stomach. Electronically Signed   By: Oneil Devonshire M.D.   On: 10/23/2023 01:57   CT ABDOMEN PELVIS WO CONTRAST Result Date: 10/22/2023 CLINICAL DATA:  Abdominal pain and distension with diarrhea, initial encounter EXAM: CT ABDOMEN AND PELVIS WITHOUT CONTRAST TECHNIQUE: Multidetector CT imaging of the abdomen and pelvis was performed following the standard protocol without IV contrast. RADIATION DOSE REDUCTION: This exam was performed according to the departmental dose-optimization program which includes automated exposure control, adjustment of the mA and/or kV according to patient size and/or use of iterative reconstruction technique. COMPARISON:  07/21/2023 FINDINGS: Lower chest: No acute abnormality. Hepatobiliary: No focal liver abnormality is seen. No gallstones, gallbladder wall thickening, or biliary dilatation. Pancreas: Unremarkable. No pancreatic ductal dilatation  or surrounding inflammatory changes. Spleen: Normal in size without focal abnormality. Adrenals/Urinary Tract: Adrenal glands are within normal limits. Chronic hydronephrosis on the right is noted with cortical thinning identified stable from the prior exam. Left kidney appears within normal limits. No renal calculi or obstructive changes are seen. Bladder has been surgically removed as has the prostate. A right lower quadrant ileal conduit is seen. Previously seen peristomal hernia is noted with small bowel although this may represent a portion of the ileal conduit. Stomach/Bowel: No obstructive or inflammatory changes of the colon are seen. The appendix is within normal limits terminal ileum is unremarkable. Multiple fluid-filled dilated loops of small bowel are identified in the jejunum and proximal ileum. Position zone is noted in the left mid abdomen although no mass is seen. This is likely related to postoperative adhesions. The stomach is significantly distended with fluid. Vascular/Lymphatic: Aortic atherosclerosis. No enlarged abdominal or pelvic lymph nodes. Reproductive: Prostate has been surgically removed. Other: No abdominal wall hernia or abnormality. No abdominopelvic ascites. Musculoskeletal: No acute or significant osseous findings. IMPRESSION: Small-bowel obstruction with a transition zone in the left mid abdomen best seen on image number 42 of series 2. The bowel distal to this is within normal limits. Changes consistent with prior cysto prostatectomy with right ileal conduit. The overall appearance is stable. Chronic right hydronephrosis with renal atrophy on the right. Electronically Signed   By: Oneil Devonshire M.D.   On: 10/22/2023 22:58     Assessment & Plan:  CORY KITT is a 83 y.o. male who was admitted with concern for small bowel obstruction.  Imaging and blood work evaluated by myself.  -I discussed the pathophysiology of bowel obstructions with the patient.  We had a long  discussion about the management, and how we try to treat them conservatively prior to any surgical interventions.  I further discussed with him that conservative treatment involves NG tube decompression in addition to IV fluids and  NPO.  I discussed the importance of NG tube decompression at this time -After discussion, the patient is agreeable to NG tube decompression at this time.  Discussed with nurse about placement of NG tube -NG to LIS once position confirmed on KUB -NPO.  Okay for ice chips and sips of water  to keep his mouth moist -IVF per primary team -Would plan for decompression via NG tube today and small bowel obstruction protocol likely starting tomorrow morning -No acute surgical intervention at this time -Will order Dulcolax suppositories -Appreciate hospitalist recommendations  All questions were answered to the satisfaction of the patient and family.  -- Dorothyann Brittle, DO [facility] [address] [place], [place] [address] [phone] (office)

## 2023-10-23 NOTE — ED Notes (Signed)
 Went to advance NG tube per xray recommendation, pt stated that he wanted NG tube removed because it was uncomfortable. Adefeso made aware and at bedside. Pt instructed about what the NG tube helps and that removing it will cause more vomiting due to contents sitting on stomach. Pt still wants NG removed. NG removed at this time.

## 2023-10-24 ENCOUNTER — Inpatient Hospital Stay (HOSPITAL_COMMUNITY): Payer: Medicare Other

## 2023-10-24 DIAGNOSIS — K56609 Unspecified intestinal obstruction, unspecified as to partial versus complete obstruction: Secondary | ICD-10-CM | POA: Diagnosis not present

## 2023-10-24 LAB — CBC
HCT: 40.2 % (ref 39.0–52.0)
Hemoglobin: 12.7 g/dL — ABNORMAL LOW (ref 13.0–17.0)
MCH: 29.5 pg (ref 26.0–34.0)
MCHC: 31.6 g/dL (ref 30.0–36.0)
MCV: 93.3 fL (ref 80.0–100.0)
Platelets: 169 10*3/uL (ref 150–400)
RBC: 4.31 MIL/uL (ref 4.22–5.81)
RDW: 14.4 % (ref 11.5–15.5)
WBC: 6.8 10*3/uL (ref 4.0–10.5)
nRBC: 0 % (ref 0.0–0.2)

## 2023-10-24 LAB — BASIC METABOLIC PANEL
Anion gap: 11 (ref 5–15)
BUN: 65 mg/dL — ABNORMAL HIGH (ref 8–23)
CO2: 25 mmol/L (ref 22–32)
Calcium: 7.7 mg/dL — ABNORMAL LOW (ref 8.9–10.3)
Chloride: 101 mmol/L (ref 98–111)
Creatinine, Ser: 2.97 mg/dL — ABNORMAL HIGH (ref 0.61–1.24)
GFR, Estimated: 21 mL/min — ABNORMAL LOW (ref >60)
Glucose, Bld: 126 mg/dL — ABNORMAL HIGH (ref 70–99)
Potassium: 3.9 mmol/L (ref 3.5–5.1)
Sodium: 137 mmol/L (ref 135–145)

## 2023-10-24 LAB — HEMOGLOBIN AND HEMATOCRIT, BLOOD
HCT: 39.1 % (ref 39.0–52.0)
Hemoglobin: 12.6 g/dL — ABNORMAL LOW (ref 13.0–17.0)

## 2023-10-24 LAB — GLUCOSE, CAPILLARY
Glucose-Capillary: 113 mg/dL — ABNORMAL HIGH (ref 70–99)
Glucose-Capillary: 127 mg/dL — ABNORMAL HIGH (ref 70–99)
Glucose-Capillary: 117 mg/dL — ABNORMAL HIGH (ref 70–99)
Glucose-Capillary: 95 mg/dL (ref 70–99)
Glucose-Capillary: 152 mg/dL — ABNORMAL HIGH (ref 70–99)

## 2023-10-24 LAB — MAGNESIUM: Magnesium: 1.9 mg/dL (ref 1.7–2.4)

## 2023-10-24 MED ORDER — SODIUM CHLORIDE 0.9 % IV SOLN: INTRAVENOUS | Status: AC

## 2023-10-24 MED ORDER — DIATRIZOATE MEGLUMINE & SODIUM 66-10 % PO SOLN
90.0000 mL | Freq: Once | ORAL | Status: AC
  Administered 2023-10-24: 90 mL via NASOGASTRIC
  Filled 2023-10-24: qty 90

## 2023-10-24 NOTE — Progress Notes (Signed)
 Advanced patients NG tube 5 cm per MD Pappayliou order at 1230, noted patient having output from NG tube amount 400 mL.

## 2023-10-24 NOTE — Plan of Care (Signed)
   Problem: Education: Goal: Knowledge of General Education information will improve Description: Including pain rating scale, medication(s)/side effects and non-pharmacologic comfort measures Outcome: Progressing   Problem: Activity: Goal: Risk for activity intolerance will decrease Outcome: Progressing   Problem: Elimination: Goal: Will not experience complications related to bowel motility Outcome: Progressing

## 2023-10-24 NOTE — Progress Notes (Signed)
 [facility] Progress Note     Subjective: Patient seen and examined.  He is resting comfortably in bed.  He states that he is feeling good and feels thirsty today.  He had a small loose bowel movement today.  He thinks he might of passed a little bit of gas, though he has been holding it, as he does not want to have an accident in his bed.  He has not had any significant NG tube output since placement.  He denies nausea and vomiting.  He also feels less bloated today.  He denies abdominal pain.  Objective: Vital signs in last 24 hours: Temp:  [99 F (37.2 C)-99.1 F (37.3 C)] 99.1 F (37.3 C) (01/11 0408) Pulse Rate:  [97-117] 97 (01/11 0408) Resp:  [19-20] 19 (01/10 2200) BP: (90-126)/(70-86) 126/70 (01/11 0408) SpO2:  [90 %-93 %] 91 % (01/11 0408) Last BM Date : 10/23/23  Intake/Output from previous day: 01/10 0701 - 01/11 0700 In: 2329.2 [I.V.:2329.2] Out: -  Intake/Output this shift: No intake/output data recorded.  General appearance: alert, cooperative, and no distress Nose: NG tube in place with no output in canister GI: Abdomen soft, improved distention, no percussion tenderness, nontender to palpation; no rigidity, guarding, rebound tenderness, right-sided urostomy with urine in bag  Lab Results:  Recent Labs    10/23/23 0514 10/24/23 0336 10/24/23 0907  WBC 5.6 6.8  --   HGB 16.0 12.7* 12.6*  HCT 48.9 40.2 39.1  PLT 235 169  --    BMET Recent Labs    10/23/23 1258 10/24/23 0336  NA 137 137  K 4.2 3.9  CL 94* 101  CO2 28 25  GLUCOSE 129* 126*  BUN 67* 65*  CREATININE 4.08* 2.97*  CALCIUM  8.4* 7.7*   PT/INR No results for input(s): LABPROT, INR in the last 72 hours.  Studies/Results: DG Abd Portable 1V-Small Bowel Protocol-Position Verification Result Date: 10/24/2023 CLINICAL DATA:  NG tube placement EXAM: PORTABLE ABDOMEN - 1 VIEW COMPARISON:  10/23/2023 FINDINGS: NG tube tip is in the proximal stomach. Small amount of  contrast injected, located in the fundus. IMPRESSION: NG tube tip in the proximal stomach Electronically Signed   By: Franky Crease M.D.   On: 10/24/2023 08:48   DG Abd 1 View Result Date: 10/23/2023 CLINICAL DATA:  NG tube placement. EXAM: ABDOMEN - 1 VIEW COMPARISON:  Earlier today FINDINGS: Tip and side port of the enteric tube below the diaphragm in the stomach. Scattered air throughout small bowel in the central abdomen, similar to prior. There may be a developing small left pleural effusion. IMPRESSION: Tip and side port of the enteric tube below the diaphragm in the stomach. Electronically Signed   By: Andrea Gasman M.D.   On: 10/23/2023 15:51   DG Abd 2 Views Result Date: 10/23/2023 CLINICAL DATA:  83 year old male with abdominal pain, distension, small-bowel obstruction. EXAM: ABDOMEN - 2 VIEW COMPARISON:  CT Abdomen and Pelvis 10/22/2023. FINDINGS: Upright and supine AP views of the abdomen and pelvis 1050 hours. Improved small-bowel gas pattern since yesterday although not completely normalized. Residual air-fluid levels in the bowel. Evidence of some small bowel wall thickening in the left abdomen. Volume of large bowel gas not significantly changed. Stable visualized osseous structures. Stable lung bases. No pneumoperitoneum. IMPRESSION: Improved but not normalized bowel gas pattern since the CT yesterday, suggesting regression of small bowel obstruction. No pneumoperitoneum. Electronically Signed   By: VEAR Hurst M.D.   On: 10/23/2023 12:08   DG  Abd 1 View Result Date: 10/23/2023 CLINICAL DATA:  Check gastric catheter placement EXAM: ABDOMEN - 1 VIEW COMPARISON:  None Available. FINDINGS: Gastric catheter is noted with the tip in the stomach. Proximal side port lies in the distal esophagus. This should be advanced deeper into the stomach. Persistent small bowel dilatation is noted. IMPRESSION: Gastric catheter as described. This should be advanced deeper into the stomach. Electronically Signed    By: Oneil Devonshire M.D.   On: 10/23/2023 01:57   CT ABDOMEN PELVIS WO CONTRAST Result Date: 10/22/2023 CLINICAL DATA:  Abdominal pain and distension with diarrhea, initial encounter EXAM: CT ABDOMEN AND PELVIS WITHOUT CONTRAST TECHNIQUE: Multidetector CT imaging of the abdomen and pelvis was performed following the standard protocol without IV contrast. RADIATION DOSE REDUCTION: This exam was performed according to the departmental dose-optimization program which includes automated exposure control, adjustment of the mA and/or kV according to patient size and/or use of iterative reconstruction technique. COMPARISON:  07/21/2023 FINDINGS: Lower chest: No acute abnormality. Hepatobiliary: No focal liver abnormality is seen. No gallstones, gallbladder wall thickening, or biliary dilatation. Pancreas: Unremarkable. No pancreatic ductal dilatation or surrounding inflammatory changes. Spleen: Normal in size without focal abnormality. Adrenals/Urinary Tract: Adrenal glands are within normal limits. Chronic hydronephrosis on the right is noted with cortical thinning identified stable from the prior exam. Left kidney appears within normal limits. No renal calculi or obstructive changes are seen. Bladder has been surgically removed as has the prostate. A right lower quadrant ileal conduit is seen. Previously seen peristomal hernia is noted with small bowel although this may represent a portion of the ileal conduit. Stomach/Bowel: No obstructive or inflammatory changes of the colon are seen. The appendix is within normal limits terminal ileum is unremarkable. Multiple fluid-filled dilated loops of small bowel are identified in the jejunum and proximal ileum. Position zone is noted in the left mid abdomen although no mass is seen. This is likely related to postoperative adhesions. The stomach is significantly distended with fluid. Vascular/Lymphatic: Aortic atherosclerosis. No enlarged abdominal or pelvic lymph nodes.  Reproductive: Prostate has been surgically removed. Other: No abdominal wall hernia or abnormality. No abdominopelvic ascites. Musculoskeletal: No acute or significant osseous findings. IMPRESSION: Small-bowel obstruction with a transition zone in the left mid abdomen best seen on image number 42 of series 2. The bowel distal to this is within normal limits. Changes consistent with prior cysto prostatectomy with right ileal conduit. The overall appearance is stable. Chronic right hydronephrosis with renal atrophy on the right. Electronically Signed   By: Oneil Devonshire M.D.   On: 10/22/2023 22:58    Anti-infectives: Anti-infectives (From admission, onward)    None       Assessment/Plan:  Patient is an 83 year old male who was admitted with concern for small bowel obstruction.  -Given no significant output from NG tube, plan for small bowel obstruction protocol today. -On KUB confirming location of NG tube, NG tube appears that has been slightly pulled out.  I have requested that the nurse advance the NG tube an additional 5 cm -NG to LIS while not clamped for small bowel obstruction protocol -NPO, okay for ice chips and sips of water  -IV fluids per primary team -No acute surgical intervention at this time -Continue Dulcolax suppository -Further recommendations to follow 8-hour image today.  I discussed with the patient and his family that if there is no contrast in the colon at the 8-hour film, we will keep the NG tube in place and repeat x-ray 24  hours after administration of Gastrografin .  If there is still no contrast within the colon on this film, then he will likely need to undergo surgery -Appreciate hospitalist recommendations   LOS: 1 day    Kaidance Pantoja A Thelda Gagan 10/24/2023

## 2023-10-24 NOTE — Progress Notes (Signed)
 PROGRESS NOTE    [name]  FMW:[id number] DOB: [birth year] DOA: 10/22/2023 PCP: Shona Norleen PEDLAR, MD   Brief Narrative:     [name] is a 83 y.o. male with medical history significant of hyperlipidemia, GERD, bladder cancer who presents to the emergency department due to 3 days onset of diarrhea and emesis.  Diarrhea was 3-4 episodes daily, but this resolved 2 days ago.  He endorsed poor oral intake in the last few days and he complained of being weak.  Patient was admitted for small bowel obstruction and is being followed by general surgery.  He will currently require NG tube replacement.  Assessment & Plan:   Principal Problem:   SBO (small bowel obstruction) (HCC) Active Problems:   GERD without esophagitis   Bladder cancer (HCC)   GI bleed   Nausea & vomiting   Hyponatremia   Hypercalcemia   Hyperglycemia   Mixed hyperlipidemia   AKI (acute kidney injury) (HCC)  Assessment and Plan:   Small bowel obstruction NG tube was placed and greater than 1 L of coffee colored fluid was drained prior to patient requesting for the NG tube to be removed due to intolerance Continue NPO at this time except sips and ice chips with plan to advance diet as tolerated Continue IV hydration Continue Zofran  p.r.n. for nausea/vomiting Appreciate ongoing general surgery recommendations for NG tube placement   Nausea and vomiting Continue IV Zofran  p.r.n. Continue IV hydration   Hypercalcemia-resolved This may be due to multifactorial including being paraneoplastic Calcium  10.9 Continue IV hydration and monitor calcium  level  Hyponatremia-improved Appears to be related to SIADH We will maintain on fluid restriction once tolerating diet   Hyperglycemia possibly reactive-improved No known history of type 2 diabetes mellitus Hemoglobin A1c will be checked Continue to monitor blood glucose level   Acute kidney injury-improving BUN/creatinine 45/3.53 (last creatinine for comparison  was 3 years ago and it was 1.4), Continue gentle hydration Renally adjust medications, avoid nephrotoxic agents/dehydration/hypotension   Mixed hyperlipidemia Consider starting patient on Lipitor when he resumes oral intake   GERD Continue Protonix    DVT prophylaxis:SCDs Code Status: Full Family Communication: Wife, Devere, at bedside 1/10 Disposition Plan:  Status is: Inpatient Remains inpatient appropriate because: Need for IV medications.  Consultants:  General Surgery  Procedures:  None  Antimicrobials:  None   Subjective: Patient seen and evaluated today with no new acute complaints or concerns. No acute concerns or events noted overnight.  He continues to have NG tube placed.  Objective: Vitals:   10/23/23 1644 10/23/23 1849 10/23/23 2200 10/24/23 0408  BP: 111/79 113/71 101/86 126/70  Pulse: (!) 114 (!) 107 (!) 103 97  Resp: 20 20 19    Temp: 99 F (37.2 C) 99 F (37.2 C) 99 F (37.2 C) 99.1 F (37.3 C)  TempSrc: Oral Oral Oral Oral  SpO2: 93% 93% 93% 91%  Weight:        Intake/Output Summary (Last 24 hours) at 10/24/2023 1128 Last data filed at 10/24/2023 0500 Gross per 24 hour  Intake 2329.17 ml  Output --  Net 2329.17 ml   Filed Weights   10/22/23 2003 10/23/23 0534  Weight: 71.7 kg 69.4 kg    Examination:  General exam: Appears calm and comfortable  Respiratory system: Clear to auscultation. Respiratory effort normal. Cardiovascular system: S1 & S2 heard, RRR.  Gastrointestinal system: Abdomen is soft, NG tube present Central nervous system: Alert and awake Extremities: No edema Skin: No significant lesions noted  Psychiatry: Flat affect.    Data Reviewed: I have personally reviewed following labs and imaging studies  CBC: Recent Labs  Lab 10/22/23 2014 10/23/23 0514 10/24/23 0336 10/24/23 0907  WBC 10.2 5.6 6.8  --   HGB 17.7* 16.0 12.7* 12.6*  HCT 52.4* 48.9 40.2 39.1  MCV 89.0 91.6 93.3  --   PLT 312 235 169  --    Basic  Metabolic Panel: Recent Labs  Lab 10/22/23 2014 10/23/23 0514 10/23/23 1258 10/24/23 0336  NA 131* 135 137 137  K 4.5 4.2 4.2 3.9  CL 86* 87* 94* 101  CO2 23 30 28 25   GLUCOSE 189* 134* 129* 126*  BUN 45* 56* 67* 65*  CREATININE 3.53* 4.21* 4.08* 2.97*  CALCIUM  10.9* 9.4 8.4* 7.7*  MG  --  2.3  --  1.9  PHOS  --  4.9*  --   --    GFR: CrCl cannot be calculated (Unknown ideal weight.). Liver Function Tests: Recent Labs  Lab 10/22/23 2014 10/23/23 0514  AST 38 38  ALT 25 25  ALKPHOS 106 87  BILITOT 4.4* 3.8*  PROT 9.3* 8.3*  ALBUMIN 5.1* 4.3   Recent Labs  Lab 10/22/23 2014  LIPASE 40   No results for input(s): AMMONIA in the last 168 hours. Coagulation Profile: No results for input(s): INR, PROTIME in the last 168 hours. Cardiac Enzymes: No results for input(s): CKTOTAL, CKMB, CKMBINDEX, TROPONINI in the last 168 hours. BNP (last 3 results) No results for input(s): PROBNP in the last 8760 hours. HbA1C: Recent Labs    10/23/23 0514  HGBA1C 5.3   CBG: Recent Labs  Lab 10/23/23 1159 10/23/23 1653 10/23/23 2057 10/24/23 0405 10/24/23 0722  GLUCAP 124* 135* 145* 113* 127*   Lipid Profile: No results for input(s): CHOL, HDL, LDLCALC, TRIG, CHOLHDL, LDLDIRECT in the last 72 hours. Thyroid Function Tests: No results for input(s): TSH, T4TOTAL, FREET4, T3FREE, THYROIDAB in the last 72 hours. Anemia Panel: No results for input(s): VITAMINB12, FOLATE, FERRITIN, TIBC, IRON , RETICCTPCT in the last 72 hours. Sepsis Labs: No results for input(s): PROCALCITON, LATICACIDVEN in the last 168 hours.  No results found for this or any previous visit (from the past 240 hours).       Radiology Studies: DG Abd Portable 1V-Small Bowel Protocol-Position Verification Result Date: 10/24/2023 CLINICAL DATA:  NG tube placement EXAM: PORTABLE ABDOMEN - 1 VIEW COMPARISON:  10/23/2023 FINDINGS: NG tube tip is in the  proximal stomach. Small amount of contrast injected, located in the fundus. IMPRESSION: NG tube tip in the proximal stomach Electronically Signed   By: Franky Crease M.D.   On: 10/24/2023 08:48   DG Abd 1 View Result Date: 10/23/2023 CLINICAL DATA:  NG tube placement. EXAM: ABDOMEN - 1 VIEW COMPARISON:  Earlier today FINDINGS: Tip and side port of the enteric tube below the diaphragm in the stomach. Scattered air throughout small bowel in the central abdomen, similar to prior. There may be a developing small left pleural effusion. IMPRESSION: Tip and side port of the enteric tube below the diaphragm in the stomach. Electronically Signed   By: Andrea Gasman M.D.   On: 10/23/2023 15:51   DG Abd 2 Views Result Date: 10/23/2023 CLINICAL DATA:  83 year old male with abdominal pain, distension, small-bowel obstruction. EXAM: ABDOMEN - 2 VIEW COMPARISON:  CT Abdomen and Pelvis 10/22/2023. FINDINGS: Upright and supine AP views of the abdomen and pelvis 1050 hours. Improved small-bowel gas pattern since yesterday although not completely normalized. Residual  air-fluid levels in the bowel. Evidence of some small bowel wall thickening in the left abdomen. Volume of large bowel gas not significantly changed. Stable visualized osseous structures. Stable lung bases. No pneumoperitoneum. IMPRESSION: Improved but not normalized bowel gas pattern since the CT yesterday, suggesting regression of small bowel obstruction. No pneumoperitoneum. Electronically Signed   By: VEAR Hurst M.D.   On: 10/23/2023 12:08   DG Abd 1 View Result Date: 10/23/2023 CLINICAL DATA:  Check gastric catheter placement EXAM: ABDOMEN - 1 VIEW COMPARISON:  None Available. FINDINGS: Gastric catheter is noted with the tip in the stomach. Proximal side port lies in the distal esophagus. This should be advanced deeper into the stomach. Persistent small bowel dilatation is noted. IMPRESSION: Gastric catheter as described. This should be advanced deeper into  the stomach. Electronically Signed   By: Oneil Devonshire M.D.   On: 10/23/2023 01:57   CT ABDOMEN PELVIS WO CONTRAST Result Date: 10/22/2023 CLINICAL DATA:  Abdominal pain and distension with diarrhea, initial encounter EXAM: CT ABDOMEN AND PELVIS WITHOUT CONTRAST TECHNIQUE: Multidetector CT imaging of the abdomen and pelvis was performed following the standard protocol without IV contrast. RADIATION DOSE REDUCTION: This exam was performed according to the departmental dose-optimization program which includes automated exposure control, adjustment of the mA and/or kV according to patient size and/or use of iterative reconstruction technique. COMPARISON:  07/21/2023 FINDINGS: Lower chest: No acute abnormality. Hepatobiliary: No focal liver abnormality is seen. No gallstones, gallbladder wall thickening, or biliary dilatation. Pancreas: Unremarkable. No pancreatic ductal dilatation or surrounding inflammatory changes. Spleen: Normal in size without focal abnormality. Adrenals/Urinary Tract: Adrenal glands are within normal limits. Chronic hydronephrosis on the right is noted with cortical thinning identified stable from the prior exam. Left kidney appears within normal limits. No renal calculi or obstructive changes are seen. Bladder has been surgically removed as has the prostate. A right lower quadrant ileal conduit is seen. Previously seen peristomal hernia is noted with small bowel although this may represent a portion of the ileal conduit. Stomach/Bowel: No obstructive or inflammatory changes of the colon are seen. The appendix is within normal limits terminal ileum is unremarkable. Multiple fluid-filled dilated loops of small bowel are identified in the jejunum and proximal ileum. Position zone is noted in the left mid abdomen although no mass is seen. This is likely related to postoperative adhesions. The stomach is significantly distended with fluid. Vascular/Lymphatic: Aortic atherosclerosis. No enlarged  abdominal or pelvic lymph nodes. Reproductive: Prostate has been surgically removed. Other: No abdominal wall hernia or abnormality. No abdominopelvic ascites. Musculoskeletal: No acute or significant osseous findings. IMPRESSION: Small-bowel obstruction with a transition zone in the left mid abdomen best seen on image number 42 of series 2. The bowel distal to this is within normal limits. Changes consistent with prior cysto prostatectomy with right ileal conduit. The overall appearance is stable. Chronic right hydronephrosis with renal atrophy on the right. Electronically Signed   By: Oneil Devonshire M.D.   On: 10/22/2023 22:58        Scheduled Meds:  bisacodyl   10 mg Rectal Daily   pantoprazole  (PROTONIX ) IV  40 mg Intravenous Q12H   Continuous Infusions:  sodium chloride  100 mL/hr at 10/24/23 0849     LOS: 1 day    Time spent: 55 minutes    [place] Throckmorton D Maree, DO [facility]  If 7PM-7AM, please contact night-coverage [email or web address] 10/24/2023, 11:28 AM

## 2023-10-25 DIAGNOSIS — K56609 Unspecified intestinal obstruction, unspecified as to partial versus complete obstruction: Secondary | ICD-10-CM | POA: Diagnosis not present

## 2023-10-25 LAB — GLUCOSE, CAPILLARY
Glucose-Capillary: 92 mg/dL (ref 70–99)
Glucose-Capillary: 107 mg/dL — ABNORMAL HIGH (ref 70–99)
Glucose-Capillary: 132 mg/dL — ABNORMAL HIGH (ref 70–99)

## 2023-10-25 LAB — CBC
HCT: 35 % — ABNORMAL LOW (ref 39.0–52.0)
Hemoglobin: 10.9 g/dL — ABNORMAL LOW (ref 13.0–17.0)
MCH: 29.5 pg (ref 26.0–34.0)
MCHC: 31.1 g/dL (ref 30.0–36.0)
MCV: 94.6 fL (ref 80.0–100.0)
Platelets: 151 10*3/uL (ref 150–400)
RBC: 3.7 MIL/uL — ABNORMAL LOW (ref 4.22–5.81)
RDW: 14.1 % (ref 11.5–15.5)
WBC: 7.8 10*3/uL (ref 4.0–10.5)
nRBC: 0 % (ref 0.0–0.2)

## 2023-10-25 LAB — BASIC METABOLIC PANEL
Anion gap: 8 (ref 5–15)
BUN: 44 mg/dL — ABNORMAL HIGH (ref 8–23)
CO2: 23 mmol/L (ref 22–32)
Calcium: 7.7 mg/dL — ABNORMAL LOW (ref 8.9–10.3)
Chloride: 106 mmol/L (ref 98–111)
Creatinine, Ser: 1.72 mg/dL — ABNORMAL HIGH (ref 0.61–1.24)
GFR, Estimated: 40 mL/min — ABNORMAL LOW (ref >60)
Glucose, Bld: 104 mg/dL — ABNORMAL HIGH (ref 70–99)
Potassium: 3.4 mmol/L — ABNORMAL LOW (ref 3.5–5.1)
Sodium: 137 mmol/L (ref 135–145)

## 2023-10-25 LAB — RETICULOCYTES
Immature Retic Fract: 13.1 % (ref 2.3–15.9)
RBC.: 3.68 MIL/uL — ABNORMAL LOW (ref 4.22–5.81)
Retic Count, Absolute: 63.7 10*3/uL (ref 19.0–186.0)
Retic Ct Pct: 1.7 % (ref 0.4–3.1)

## 2023-10-25 LAB — MAGNESIUM: Magnesium: 1.9 mg/dL (ref 1.7–2.4)

## 2023-10-25 LAB — IRON AND TIBC
Iron: 35 ug/dL — ABNORMAL LOW (ref 45–182)
Saturation Ratios: 14 % — ABNORMAL LOW (ref 17.9–39.5)
TIBC: 244 ug/dL — ABNORMAL LOW (ref 250–450)
UIBC: 209 ug/dL

## 2023-10-25 LAB — FERRITIN: Ferritin: 166 ng/mL (ref 24–336)

## 2023-10-25 LAB — VITAMIN B12: Vitamin B-12: 252 pg/mL (ref 180–914)

## 2023-10-25 LAB — FOLATE: Folate: 16.7 ng/mL (ref >5.9)

## 2023-10-25 MED ORDER — SENNOSIDES-DOCUSATE SODIUM 8.6-50 MG PO TABS
1.0000 | ORAL_TABLET | Freq: Two times a day (BID) | ORAL | Status: DC
  Administered 2023-10-25 – 2023-10-26 (×3): 1 via ORAL
  Filled 2023-10-25 (×3): qty 1

## 2023-10-25 MED ORDER — POLYETHYLENE GLYCOL 3350 17 G PO PACK
17.0000 g | PACK | Freq: Every day | ORAL | Status: DC
  Administered 2023-10-25 – 2023-10-26 (×2): 17 g via ORAL
  Filled 2023-10-25 (×2): qty 1

## 2023-10-25 MED ORDER — POTASSIUM CHLORIDE CRYS ER 20 MEQ PO TBCR
40.0000 meq | EXTENDED_RELEASE_TABLET | Freq: Once | ORAL | Status: AC
  Administered 2023-10-25: 40 meq via ORAL
  Filled 2023-10-25: qty 2

## 2023-10-25 MED ORDER — SODIUM CHLORIDE 0.9 % IV SOLN
100.0000 mg | Freq: Once | INTRAVENOUS | Status: AC
  Administered 2023-10-25: 100 mg via INTRAVENOUS
  Filled 2023-10-25: qty 5

## 2023-10-25 NOTE — Progress Notes (Signed)
 PROGRESS NOTE    [name]  FMW:[id number] DOB: [birth year] DOA: 10/22/2023 PCP: Shona Norleen PEDLAR, MD   Brief Narrative:     [name] is a 83 y.o. male with medical history significant of hyperlipidemia, GERD, bladder cancer who presents to the emergency department due to 3 days onset of diarrhea and emesis.  Diarrhea was 3-4 episodes daily, but this resolved 2 days ago.  He endorsed poor oral intake in the last few days and he complained of being weak.  Patient was admitted for small bowel obstruction and is being followed by general surgery.  He he is responding to conservative management and diet is being advanced.  Assessment & Plan:   Principal Problem:   SBO (small bowel obstruction) (HCC) Active Problems:   GERD without esophagitis   Bladder cancer (HCC)   GI bleed   Nausea & vomiting   Hyponatremia   Hypercalcemia   Hyperglycemia   Mixed hyperlipidemia   AKI (acute kidney injury) (HCC)  Assessment and Plan:   Small bowel obstruction-resolving NG tube was placed and greater than 1 L of coffee colored fluid was drained prior to patient requesting for the NG tube to be removed due to intolerance Advance to full liquid diet and then soft diet later today If tolerating, anticipate discharge in a.m.   Worsening anemia Noted to have iron  deficiency No overt bleeding, follow CBC in a.m. 1 dose of Venofer    Hypercalcemia-resolved This may be due to multifactorial including being paraneoplastic Calcium  10.9 Continue IV hydration and monitor calcium  level  Hyponatremia-improved Appears to be related to SIADH We will maintain on fluid restriction once tolerating diet  Mild hypokalemia -Replete and reevaluate   Hyperglycemia possibly reactive-improved No known history of type 2 diabetes mellitus Hemoglobin A1c will be checked Continue to monitor blood glucose level   Acute kidney injury-improving BUN/creatinine 45/3.53 (last creatinine for comparison was 3 years  ago and it was 1.4), Continue gentle hydration Renally adjust medications, avoid nephrotoxic agents/dehydration/hypotension   Mixed hyperlipidemia Plan to resume Lipitor at discharge   GERD Continue Protonix    DVT prophylaxis:SCDs Code Status: Full Family Communication: Wife, Devere, at bedside 1/10 Disposition Plan:  Status is: Inpatient Remains inpatient appropriate because: Need for IV medications.  Consultants:  General Surgery  Procedures:  None  Antimicrobials:  None   Subjective: Patient seen and evaluated today with no new acute complaints or concerns. No acute concerns or events noted overnight.  He appears to overall be doing well and diet is being advanced by general surgery.  Patient has had flatus and 2 bowel movements.  Objective: Vitals:   10/23/23 2200 10/24/23 0408 10/24/23 1421 10/24/23 2026  BP: 101/86 126/70 139/80 (!) 143/80  Pulse: (!) 103 97 95 100  Resp: 19  20 18   Temp: 99 F (37.2 C) 99.1 F (37.3 C) 98.2 F (36.8 C) 97.6 F (36.4 C)  TempSrc: Oral Oral Axillary Oral  SpO2: 93% 91% 95% 93%  Weight:        Intake/Output Summary (Last 24 hours) at 10/25/2023 1108 Last data filed at 10/25/2023 0554 Gross per 24 hour  Intake 1455.73 ml  Output 900 ml  Net 555.73 ml   Filed Weights   10/22/23 2003 10/23/23 0534  Weight: 71.7 kg 69.4 kg    Examination:  General exam: Appears calm and comfortable  Respiratory system: Clear to auscultation. Respiratory effort normal. Cardiovascular system: S1 & S2 heard, RRR.  Gastrointestinal system: Abdomen is soft, NG tube  present Central nervous system: Alert and awake Extremities: No edema Skin: No significant lesions noted Psychiatry: Flat affect.    Data Reviewed: I have personally reviewed following labs and imaging studies  CBC: Recent Labs  Lab 10/22/23 2014 10/23/23 0514 10/24/23 0336 10/24/23 0907 10/25/23 0421  WBC 10.2 5.6 6.8  --  7.8  HGB 17.7* 16.0 12.7* 12.6* 10.9*   HCT 52.4* 48.9 40.2 39.1 35.0*  MCV 89.0 91.6 93.3  --  94.6  PLT 312 235 169  --  151   Basic Metabolic Panel: Recent Labs  Lab 10/22/23 2014 10/23/23 0514 10/23/23 1258 10/24/23 0336 10/25/23 0421  NA 131* 135 137 137 137  K 4.5 4.2 4.2 3.9 3.4*  CL 86* 87* 94* 101 106  CO2 23 30 28 25 23   GLUCOSE 189* 134* 129* 126* 104*  BUN 45* 56* 67* 65* 44*  CREATININE 3.53* 4.21* 4.08* 2.97* 1.72*  CALCIUM  10.9* 9.4 8.4* 7.7* 7.7*  MG  --  2.3  --  1.9 1.9  PHOS  --  4.9*  --   --   --    GFR: CrCl cannot be calculated (Unknown ideal weight.). Liver Function Tests: Recent Labs  Lab 10/22/23 2014 10/23/23 0514  AST 38 38  ALT 25 25  ALKPHOS 106 87  BILITOT 4.4* 3.8*  PROT 9.3* 8.3*  ALBUMIN 5.1* 4.3   Recent Labs  Lab 10/22/23 2014  LIPASE 40   No results for input(s): AMMONIA in the last 168 hours. Coagulation Profile: No results for input(s): INR, PROTIME in the last 168 hours. Cardiac Enzymes: No results for input(s): CKTOTAL, CKMB, CKMBINDEX, TROPONINI in the last 168 hours. BNP (last 3 results) No results for input(s): PROBNP in the last 8760 hours. HbA1C: Recent Labs    10/23/23 0514  HGBA1C 5.3   CBG: Recent Labs  Lab 10/24/23 1631 10/24/23 2136 10/25/23 0059 10/25/23 0506 10/25/23 0714  GLUCAP 95 152* 92 107* 132*   Lipid Profile: No results for input(s): CHOL, HDL, LDLCALC, TRIG, CHOLHDL, LDLDIRECT in the last 72 hours. Thyroid Function Tests: No results for input(s): TSH, T4TOTAL, FREET4, T3FREE, THYROIDAB in the last 72 hours. Anemia Panel: Recent Labs    10/25/23 0724 10/25/23 0728 10/25/23 0729  VITAMINB12 252  --   --   FOLATE  --  16.7  --   FERRITIN 166  --   --   TIBC 244*  --   --   IRON  35*  --   --   RETICCTPCT  --   --  1.7   Sepsis Labs: No results for input(s): PROCALCITON, LATICACIDVEN in the last 168 hours.  No results found for this or any previous visit (from the past 240  hours).       Radiology Studies: DG Abd Portable 1V-Small Bowel Obstruction Protocol-initial, 8 hr delay Result Date: 10/24/2023 CLINICAL DATA:  Small bowel obstruction, 8 hour delay. EXAM: PORTABLE ABDOMEN - 1 VIEW COMPARISON:  Radiograph earlier today.  CT 10/22/2023 FINDINGS: Administered enteric contrast is seen throughout the entire colon to the level of the rectum. A prominent loop of dilated small bowel persists in the left abdomen however is less dilated than earlier today. The tip of the enteric tube is in the stomach, however side port is in the distal esophagus. No free intra-abdominal air. IMPRESSION: 1. Administered enteric contrast is seen throughout the entire colon to the level of the rectum. 2. Persistent but diminished dilated loop of small bowel in the left  abdomen. 3. Enteric tube tip is in the stomach, however side port is in the distal esophagus. Advancement of 8 cm would place the side-port below the diaphragm. Electronically Signed   By: Andrea Gasman M.D.   On: 10/24/2023 18:12   DG Abd Portable 1V-Small Bowel Protocol-Position Verification Result Date: 10/24/2023 CLINICAL DATA:  NG tube placement EXAM: PORTABLE ABDOMEN - 1 VIEW COMPARISON:  10/23/2023 FINDINGS: NG tube tip is in the proximal stomach. Small amount of contrast injected, located in the fundus. IMPRESSION: NG tube tip in the proximal stomach Electronically Signed   By: Franky Crease M.D.   On: 10/24/2023 08:48   DG Abd 1 View Result Date: 10/23/2023 CLINICAL DATA:  NG tube placement. EXAM: ABDOMEN - 1 VIEW COMPARISON:  Earlier today FINDINGS: Tip and side port of the enteric tube below the diaphragm in the stomach. Scattered air throughout small bowel in the central abdomen, similar to prior. There may be a developing small left pleural effusion. IMPRESSION: Tip and side port of the enteric tube below the diaphragm in the stomach. Electronically Signed   By: Andrea Gasman M.D.   On: 10/23/2023 15:51         Scheduled Meds:  bisacodyl   10 mg Rectal Daily   pantoprazole  (PROTONIX ) IV  40 mg Intravenous Q12H   polyethylene glycol  17 g Oral Daily   senna-docusate  1 tablet Oral BID   Continuous Infusions:  iron  sucrose       LOS: 2 days    Time spent: 55 minutes    Nevae Pinnix JONETTA Fairly, DO [facility]  If 7PM-7AM, please contact night-coverage [email or web address] 10/25/2023, 11:08 AM

## 2023-10-25 NOTE — Plan of Care (Signed)
  Problem: Education: Goal: Knowledge of General Education information will improve Description: Including pain rating scale, medication(s)/side effects and non-pharmacologic comfort measures Outcome: Progressing   Problem: Health Behavior/Discharge Planning: Goal: Ability to manage health-related needs will improve Outcome: Progressing   Problem: Clinical Measurements: Goal: Ability to maintain clinical measurements within normal limits will improve Outcome: Progressing Goal: Will remain free from infection Outcome: Progressing Goal: Diagnostic test results will improve Outcome: Progressing Goal: Respiratory complications will improve Outcome: Progressing Goal: Cardiovascular complication will be avoided Outcome: Progressing   Problem: Activity: Goal: Risk for activity intolerance will decrease Outcome: Progressing   Problem: Nutrition: Goal: Adequate nutrition will be maintained Outcome: Progressing   Problem: Coping: Goal: Level of anxiety will decrease Outcome: Progressing   Problem: Elimination: Goal: Will not experience complications related to bowel motility Outcome: Progressing   Problem: Pain Management: Goal: General experience of comfort will improve Outcome: Progressing   Problem: Safety: Goal: Ability to remain free from injury will improve Outcome: Progressing   Problem: Skin Integrity: Goal: Risk for impaired skin integrity will decrease Outcome: Progressing

## 2023-10-25 NOTE — Progress Notes (Signed)
 [facility] Progress Note     Subjective: Patient seen and examined.  He is resting comfortably in bed.  He tolerated his clears without nausea and vomiting.  He has had multiple bowel movements overnight.  He is also passing a large amount of flatus.  He denies abdominal pain.  Objective: Vital signs in last 24 hours: Temp:  [97.6 F (36.4 C)-98.2 F (36.8 C)] 97.6 F (36.4 C) (01/11 2026) Pulse Rate:  [95-100] 100 (01/11 2026) Resp:  [18-20] 18 (01/11 2026) BP: (139-143)/(80) 143/80 (01/11 2026) SpO2:  [93 %-95 %] 93 % (01/11 2026) Last BM Date : 10/25/23  Intake/Output from previous day: 01/11 0701 - 01/12 0700 In: 1505.7 [P.O.:870; I.V.:635.7] Out: 2700 [Urine:2300; Emesis/NG output:400] Intake/Output this shift: No intake/output data recorded.  General appearance: alert, cooperative, and no distress GI: Abdomen soft, nondistended, no percussion tenderness, nontender to palpation; no rigidity, guarding, rebound tenderness  Lab Results:  Recent Labs    10/24/23 0336 10/24/23 0907 10/25/23 0421  WBC 6.8  --  7.8  HGB 12.7* 12.6* 10.9*  HCT 40.2 39.1 35.0*  PLT 169  --  151   BMET Recent Labs    10/24/23 0336 10/25/23 0421  NA 137 137  K 3.9 3.4*  CL 101 106  CO2 25 23  GLUCOSE 126* 104*  BUN 65* 44*  CREATININE 2.97* 1.72*  CALCIUM  7.7* 7.7*   PT/INR No results for input(s): LABPROT, INR in the last 72 hours.  Studies/Results: DG Abd Portable 1V-Small Bowel Obstruction Protocol-initial, 8 hr delay Result Date: 10/24/2023 CLINICAL DATA:  Small bowel obstruction, 8 hour delay. EXAM: PORTABLE ABDOMEN - 1 VIEW COMPARISON:  Radiograph earlier today.  CT 10/22/2023 FINDINGS: Administered enteric contrast is seen throughout the entire colon to the level of the rectum. A prominent loop of dilated small bowel persists in the left abdomen however is less dilated than earlier today. The tip of the enteric tube is in the stomach, however side  port is in the distal esophagus. No free intra-abdominal air. IMPRESSION: 1. Administered enteric contrast is seen throughout the entire colon to the level of the rectum. 2. Persistent but diminished dilated loop of small bowel in the left abdomen. 3. Enteric tube tip is in the stomach, however side port is in the distal esophagus. Advancement of 8 cm would place the side-port below the diaphragm. Electronically Signed   By: Andrea Gasman M.D.   On: 10/24/2023 18:12   DG Abd Portable 1V-Small Bowel Protocol-Position Verification Result Date: 10/24/2023 CLINICAL DATA:  NG tube placement EXAM: PORTABLE ABDOMEN - 1 VIEW COMPARISON:  10/23/2023 FINDINGS: NG tube tip is in the proximal stomach. Small amount of contrast injected, located in the fundus. IMPRESSION: NG tube tip in the proximal stomach Electronically Signed   By: Franky Crease M.D.   On: 10/24/2023 08:48   DG Abd 1 View Result Date: 10/23/2023 CLINICAL DATA:  NG tube placement. EXAM: ABDOMEN - 1 VIEW COMPARISON:  Earlier today FINDINGS: Tip and side port of the enteric tube below the diaphragm in the stomach. Scattered air throughout small bowel in the central abdomen, similar to prior. There may be a developing small left pleural effusion. IMPRESSION: Tip and side port of the enteric tube below the diaphragm in the stomach. Electronically Signed   By: Andrea Gasman M.D.   On: 10/23/2023 15:51    Anti-infectives: Anti-infectives (From admission, onward)    None       Assessment/Plan:  Patient is an  83 year old male who was admitted with concern for small bowel obstruction.  -8-hour KUB demonstrated contrast throughout the colon -NG tube was removed and clear liquid diet was initiated -Advance to full liquids.  If he tolerates full liquids for lunch, can advance to a soft diet for dinner -IV fluids per primary team, can probably Hep-Lock IV fluids given his oral intake -No acute surgical intervention at this time -Continue  Dulcolax suppository, will add on bowel regimen -Appreciate hospitalist recommendations   LOS: 2 days    [name] A Anielle Headrick 10/25/2023

## 2023-10-26 ENCOUNTER — Encounter: Payer: Self-pay | Admitting: Oncology

## 2023-10-26 DIAGNOSIS — K56609 Unspecified intestinal obstruction, unspecified as to partial versus complete obstruction: Secondary | ICD-10-CM | POA: Diagnosis not present

## 2023-10-26 LAB — BASIC METABOLIC PANEL
Anion gap: 6 (ref 5–15)
BUN: 27 mg/dL — ABNORMAL HIGH (ref 8–23)
CO2: 22 mmol/L (ref 22–32)
Calcium: 8.2 mg/dL — ABNORMAL LOW (ref 8.9–10.3)
Chloride: 109 mmol/L (ref 98–111)
Creatinine, Ser: 1.41 mg/dL — ABNORMAL HIGH (ref 0.61–1.24)
GFR, Estimated: 50 mL/min — ABNORMAL LOW (ref >60)
Glucose, Bld: 104 mg/dL — ABNORMAL HIGH (ref 70–99)
Potassium: 3.5 mmol/L (ref 3.5–5.1)
Sodium: 137 mmol/L (ref 135–145)

## 2023-10-26 LAB — CBC
HCT: 37.2 % — ABNORMAL LOW (ref 39.0–52.0)
Hemoglobin: 11.7 g/dL — ABNORMAL LOW (ref 13.0–17.0)
MCH: 29.5 pg (ref 26.0–34.0)
MCHC: 31.5 g/dL (ref 30.0–36.0)
MCV: 93.9 fL (ref 80.0–100.0)
Platelets: 166 10*3/uL (ref 150–400)
RBC: 3.96 MIL/uL — ABNORMAL LOW (ref 4.22–5.81)
RDW: 13.3 % (ref 11.5–15.5)
WBC: 9.3 10*3/uL (ref 4.0–10.5)
nRBC: 0 % (ref 0.0–0.2)

## 2023-10-26 LAB — GLUCOSE, CAPILLARY: Glucose-Capillary: 142 mg/dL — ABNORMAL HIGH (ref 70–99)

## 2023-10-26 LAB — MAGNESIUM: Magnesium: 1.9 mg/dL (ref 1.7–2.4)

## 2023-10-26 MED ORDER — SENNOSIDES-DOCUSATE SODIUM 8.6-50 MG PO TABS: 1.0000 | ORAL_TABLET | Freq: Every evening | ORAL | 0 refills | Status: AC | PRN

## 2023-10-26 MED ORDER — ONDANSETRON HCL 4 MG PO TABS: 4.0000 mg | ORAL_TABLET | Freq: Four times a day (QID) | ORAL | 0 refills | Status: AC | PRN

## 2023-10-26 NOTE — Discharge Summary (Signed)
 Physician Discharge Summary  [name] FMW:[id number] DOB: [birth year] DOA: 10/22/2023  PCP: Shona Norleen PEDLAR, MD  Admit date: 10/22/2023  Discharge date: 10/26/2023  Admitted From:Home  Disposition:  Home  Recommendations for Outpatient Follow-up:  Follow up with PCP in 1-2 weeks Continue on medications as noted below  Home Health: None  Equipment/Devices: None  Discharge Condition:Stable  CODE STATUS: Full  Diet recommendation: Heart Healthy  Brief/Interim Summary: [name] is a 83 y.o. male with medical history significant of hyperlipidemia, GERD, bladder cancer who presents to the emergency department due to 3 days onset of diarrhea and emesis.  Diarrhea was 3-4 episodes daily, but this resolved 2 days ago.  He endorsed poor oral intake in the last few days and he complained of being weak.  Patient was admitted for small bowel obstruction and is being followed by general surgery.  He briefly required NG tube placement and had small bowel follow-through evaluation.  He overall has responded well to conservative management and is now tolerating soft diet.  Stable for discharge from medical and surgical standpoint with resolution of AKI as well.  No other acute events or concerns noted.  Discharge Diagnoses:  Principal Problem:   SBO (small bowel obstruction) (HCC) Active Problems:   GERD without esophagitis   Bladder cancer (HCC)   GI bleed   Nausea & vomiting   Hyponatremia   Hypercalcemia   Hyperglycemia   Mixed hyperlipidemia   AKI (acute kidney injury) (HCC)  Principal discharge diagnosis: Small bowel obstruction with prerenal AKI-resolved.  Discharge Instructions  Discharge Instructions     Diet - low sodium heart healthy   Complete by: As directed    Increase activity slowly   Complete by: As directed       Allergies as of 10/26/2023       Reactions   Emend  [fosaprepitant  Dimeglumine] Cough   Reaction during infusion with cough, erythema of face and  neck.          Medication List     STOP taking these medications    ciprofloxacin 250 MG tablet Commonly known as: CIPRO       TAKE these medications    acetaminophen  500 MG tablet Commonly known as: TYLENOL  Take 500-1,000 mg by mouth every 6 (six) hours as needed for moderate pain (pain score 4-6).   aspirin  EC 81 MG tablet Take 81 mg by mouth daily. Swallow whole.   atorvastatin  80 MG tablet Commonly known as: Lipitor Take 1 tablet (80 mg total) by mouth daily. For stroke Prevention What changed: when to take this   multivitamin tablet Take 1 tablet by mouth daily.   ondansetron  4 MG tablet Commonly known as: ZOFRAN  Take 1 tablet (4 mg total) by mouth every 6 (six) hours as needed for nausea.   pantoprazole  20 MG tablet Commonly known as: PROTONIX  Take 20 mg by mouth daily.   promethazine  25 MG suppository Commonly known as: PHENERGAN  Place 25 mg rectally every 8 (eight) hours as needed for vomiting or nausea.   senna-docusate 8.6-50 MG tablet Commonly known as: Senokot-S Take 1 tablet by mouth at bedtime as needed for mild constipation.        Follow-up Information     Shona Norleen PEDLAR, MD. Schedule an appointment as soon as possible for a visit in 1 week(s).   Specialty: Internal Medicine Contact information: [address] [place] [address] [phone]  Allergies  Allergen Reactions   Emend  [Fosaprepitant  Dimeglumine] Cough    Reaction during infusion with cough, erythema of face and neck.      Consultations: General surgery   Procedures/Studies: DG Abd Portable 1V-Small Bowel Obstruction Protocol-initial, 8 hr delay Result Date: 10/24/2023 CLINICAL DATA:  Small bowel obstruction, 8 hour delay. EXAM: PORTABLE ABDOMEN - 1 VIEW COMPARISON:  Radiograph earlier today.  CT 10/22/2023 FINDINGS: Administered enteric contrast is seen throughout the entire colon to the level of the rectum. A prominent loop of dilated  small bowel persists in the left abdomen however is less dilated than earlier today. The tip of the enteric tube is in the stomach, however side port is in the distal esophagus. No free intra-abdominal air. IMPRESSION: 1. Administered enteric contrast is seen throughout the entire colon to the level of the rectum. 2. Persistent but diminished dilated loop of small bowel in the left abdomen. 3. Enteric tube tip is in the stomach, however side port is in the distal esophagus. Advancement of 8 cm would place the side-port below the diaphragm. Electronically Signed   By: Andrea Gasman M.D.   On: 10/24/2023 18:12   DG Abd Portable 1V-Small Bowel Protocol-Position Verification Result Date: 10/24/2023 CLINICAL DATA:  NG tube placement EXAM: PORTABLE ABDOMEN - 1 VIEW COMPARISON:  10/23/2023 FINDINGS: NG tube tip is in the proximal stomach. Small amount of contrast injected, located in the fundus. IMPRESSION: NG tube tip in the proximal stomach Electronically Signed   By: Franky Crease M.D.   On: 10/24/2023 08:48   DG Abd 1 View Result Date: 10/23/2023 CLINICAL DATA:  NG tube placement. EXAM: ABDOMEN - 1 VIEW COMPARISON:  Earlier today FINDINGS: Tip and side port of the enteric tube below the diaphragm in the stomach. Scattered air throughout small bowel in the central abdomen, similar to prior. There may be a developing small left pleural effusion. IMPRESSION: Tip and side port of the enteric tube below the diaphragm in the stomach. Electronically Signed   By: Andrea Gasman M.D.   On: 10/23/2023 15:51   DG Abd 2 Views Result Date: 10/23/2023 CLINICAL DATA:  83 year old male with abdominal pain, distension, small-bowel obstruction. EXAM: ABDOMEN - 2 VIEW COMPARISON:  CT Abdomen and Pelvis 10/22/2023. FINDINGS: Upright and supine AP views of the abdomen and pelvis 1050 hours. Improved small-bowel gas pattern since yesterday although not completely normalized. Residual air-fluid levels in the bowel. Evidence of  some small bowel wall thickening in the left abdomen. Volume of large bowel gas not significantly changed. Stable visualized osseous structures. Stable lung bases. No pneumoperitoneum. IMPRESSION: Improved but not normalized bowel gas pattern since the CT yesterday, suggesting regression of small bowel obstruction. No pneumoperitoneum. Electronically Signed   By: VEAR Hurst M.D.   On: 10/23/2023 12:08   DG Abd 1 View Result Date: 10/23/2023 CLINICAL DATA:  Check gastric catheter placement EXAM: ABDOMEN - 1 VIEW COMPARISON:  None Available. FINDINGS: Gastric catheter is noted with the tip in the stomach. Proximal side port lies in the distal esophagus. This should be advanced deeper into the stomach. Persistent small bowel dilatation is noted. IMPRESSION: Gastric catheter as described. This should be advanced deeper into the stomach. Electronically Signed   By: Oneil Devonshire M.D.   On: 10/23/2023 01:57   CT ABDOMEN PELVIS WO CONTRAST Result Date: 10/22/2023 CLINICAL DATA:  Abdominal pain and distension with diarrhea, initial encounter EXAM: CT ABDOMEN AND PELVIS WITHOUT CONTRAST TECHNIQUE: Multidetector CT imaging of the abdomen and  pelvis was performed following the standard protocol without IV contrast. RADIATION DOSE REDUCTION: This exam was performed according to the departmental dose-optimization program which includes automated exposure control, adjustment of the mA and/or kV according to patient size and/or use of iterative reconstruction technique. COMPARISON:  07/21/2023 FINDINGS: Lower chest: No acute abnormality. Hepatobiliary: No focal liver abnormality is seen. No gallstones, gallbladder wall thickening, or biliary dilatation. Pancreas: Unremarkable. No pancreatic ductal dilatation or surrounding inflammatory changes. Spleen: Normal in size without focal abnormality. Adrenals/Urinary Tract: Adrenal glands are within normal limits. Chronic hydronephrosis on the right is noted with cortical thinning  identified stable from the prior exam. Left kidney appears within normal limits. No renal calculi or obstructive changes are seen. Bladder has been surgically removed as has the prostate. A right lower quadrant ileal conduit is seen. Previously seen peristomal hernia is noted with small bowel although this may represent a portion of the ileal conduit. Stomach/Bowel: No obstructive or inflammatory changes of the colon are seen. The appendix is within normal limits terminal ileum is unremarkable. Multiple fluid-filled dilated loops of small bowel are identified in the jejunum and proximal ileum. Position zone is noted in the left mid abdomen although no mass is seen. This is likely related to postoperative adhesions. The stomach is significantly distended with fluid. Vascular/Lymphatic: Aortic atherosclerosis. No enlarged abdominal or pelvic lymph nodes. Reproductive: Prostate has been surgically removed. Other: No abdominal wall hernia or abnormality. No abdominopelvic ascites. Musculoskeletal: No acute or significant osseous findings. IMPRESSION: Small-bowel obstruction with a transition zone in the left mid abdomen best seen on image number 42 of series 2. The bowel distal to this is within normal limits. Changes consistent with prior cysto prostatectomy with right ileal conduit. The overall appearance is stable. Chronic right hydronephrosis with renal atrophy on the right. Electronically Signed   By: Oneil Devonshire M.D.   On: 10/22/2023 22:58     Discharge Exam: Vitals:   10/25/23 2018 10/26/23 0452  BP: (!) 157/57 (!) 146/82  Pulse: 79 78  Resp: 18 16  Temp: 98.8 F (37.1 C) 99.6 F (37.6 C)  SpO2: 96% 97%   Vitals:   10/24/23 2026 10/25/23 1340 10/25/23 2018 10/26/23 0452  BP: (!) 143/80 (!) 142/83 (!) 157/57 (!) 146/82  Pulse: 100 81 79 78  Resp: 18 16 18 16   Temp: 97.6 F (36.4 C) 98.6 F (37 C) 98.8 F (37.1 C) 99.6 F (37.6 C)  TempSrc: Oral  Oral Oral  SpO2: 93% 98% 96% 97%  Weight:         General: Pt is alert, awake, not in acute distress Cardiovascular: RRR, S1/S2 +, no rubs, no gallops Respiratory: CTA bilaterally, no wheezing, no rhonchi Abdominal: Soft, NT, ND, bowel sounds + Extremities: no edema, no cyanosis    The results of significant diagnostics from this hospitalization (including imaging, microbiology, ancillary and laboratory) are listed below for reference.     Microbiology: No results found for this or any previous visit (from the past 240 hours).   Labs: BNP (last 3 results) No results for input(s): BNP in the last 8760 hours. Basic Metabolic Panel: Recent Labs  Lab 10/23/23 0514 10/23/23 1258 10/24/23 0336 10/25/23 0421 10/26/23 0324  NA 135 137 137 137 137  K 4.2 4.2 3.9 3.4* 3.5  CL 87* 94* 101 106 109  CO2 30 28 25 23 22   GLUCOSE 134* 129* 126* 104* 104*  BUN 56* 67* 65* 44* 27*  CREATININE 4.21* 4.08* 2.97* 1.72*  1.41*  CALCIUM  9.4 8.4* 7.7* 7.7* 8.2*  MG 2.3  --  1.9 1.9 1.9  PHOS 4.9*  --   --   --   --    Liver Function Tests: Recent Labs  Lab 10/22/23 2014 10/23/23 0514  AST 38 38  ALT 25 25  ALKPHOS 106 87  BILITOT 4.4* 3.8*  PROT 9.3* 8.3*  ALBUMIN 5.1* 4.3   Recent Labs  Lab 10/22/23 2014  LIPASE 40   No results for input(s): AMMONIA in the last 168 hours. CBC: Recent Labs  Lab 10/22/23 2014 10/23/23 0514 10/24/23 0336 10/24/23 0907 10/25/23 0421 10/26/23 0324  WBC 10.2 5.6 6.8  --  7.8 9.3  HGB 17.7* 16.0 12.7* 12.6* 10.9* 11.7*  HCT 52.4* 48.9 40.2 39.1 35.0* 37.2*  MCV 89.0 91.6 93.3  --  94.6 93.9  PLT 312 235 169  --  151 166   Cardiac Enzymes: No results for input(s): CKTOTAL, CKMB, CKMBINDEX, TROPONINI in the last 168 hours. BNP: Invalid input(s): POCBNP CBG: Recent Labs  Lab 10/24/23 1631 10/24/23 2136 10/25/23 0059 10/25/23 0506 10/25/23 0714  GLUCAP 95 152* 92 107* 132*   D-Dimer No results for input(s): DDIMER in the last 72 hours. Hgb A1c No results  for input(s): HGBA1C in the last 72 hours. Lipid Profile No results for input(s): CHOL, HDL, LDLCALC, TRIG, CHOLHDL, LDLDIRECT in the last 72 hours. Thyroid function studies No results for input(s): TSH, T4TOTAL, T3FREE, THYROIDAB in the last 72 hours.  Invalid input(s): FREET3 Anemia work up Recent Labs    10/25/23 0724 10/25/23 0728 10/25/23 0729  VITAMINB12 252  --   --   FOLATE  --  16.7  --   FERRITIN 166  --   --   TIBC 244*  --   --   IRON  35*  --   --   RETICCTPCT  --   --  1.7   Urinalysis    Component Value Date/Time   COLORURINE YELLOW 10/22/2023 2213   APPEARANCEUR TURBID (A) 10/22/2023 2213   LABSPEC 1.015 10/22/2023 2213   PHURINE 6.0 10/22/2023 2213   GLUCOSEU NEGATIVE 10/22/2023 2213   HGBUR SMALL (A) 10/22/2023 2213   BILIRUBINUR NEGATIVE 10/22/2023 2213   KETONESUR NEGATIVE 10/22/2023 2213   PROTEINUR 100 (A) 10/22/2023 2213   NITRITE NEGATIVE 10/22/2023 2213   LEUKOCYTESUR MODERATE (A) 10/22/2023 2213   Sepsis Labs Recent Labs  Lab 10/23/23 0514 10/24/23 0336 10/25/23 0421 10/26/23 0324  WBC 5.6 6.8 7.8 9.3   Microbiology No results found for this or any previous visit (from the past 240 hours).   Time coordinating discharge: 35 minutes  SIGNED:   Adron JONETTA Fairly, DO [facility] 10/26/2023, 11:00 AM  If 7PM-7AM, please contact night-coverage [email or web address]

## 2023-10-26 NOTE — Care Management Important Message (Signed)
 Important Message  Patient Details  Name: [name] MRN: [id number] Date of Birth: [birth year]   Important Message Given:  Yes - Medicare IM     Corey Harold 10/26/2023, 11:53 AM

## 2023-10-26 NOTE — Plan of Care (Signed)
  Problem: Education: Goal: Knowledge of General Education information will improve Description: Including pain rating scale, medication(s)/side effects and non-pharmacologic comfort measures Outcome: Progressing   Problem: Health Behavior/Discharge Planning: Goal: Ability to manage health-related needs will improve Outcome: Progressing   Problem: Clinical Measurements: Goal: Ability to maintain clinical measurements within normal limits will improve Outcome: Progressing Goal: Will remain free from infection Outcome: Progressing Goal: Diagnostic test results will improve Outcome: Progressing   Problem: Activity: Goal: Risk for activity intolerance will decrease Outcome: Progressing   Problem: Pain Management: Goal: General experience of comfort will improve Outcome: Progressing   Problem: Safety: Goal: Ability to remain free from injury will improve Outcome: Progressing

## 2023-10-26 NOTE — Progress Notes (Signed)
 [facility] Progress Note     Subjective: Patient seen and examined.  He is resting comfortably in bed.  He tolerated a soft diet for breakfast.  He continues to pass flatus and have bowel function.  Denies abdominal pain.  Objective: Vital signs in last 24 hours: Temp:  [98.6 F (37 C)-99.6 F (37.6 C)] 99.6 F (37.6 C) (01/13 0452) Pulse Rate:  [78-81] 78 (01/13 0452) Resp:  [16-18] 16 (01/13 0452) BP: (142-157)/(57-83) 146/82 (01/13 0452) SpO2:  [96 %-98 %] 97 % (01/13 0452) Last BM Date : 10/25/23  Intake/Output from previous day: 01/12 0701 - 01/13 0700 In: 960 [P.O.:960] Out: 2725 [Urine:2725] Intake/Output this shift: No intake/output data recorded.  General appearance: alert, cooperative, and no distress GI: Abdomen soft, nondistended, no percussion tenderness, nontender to palpation; no rigidity, guarding, rebound tenderness  Lab Results:  Recent Labs    10/25/23 0421 10/26/23 0324  WBC 7.8 9.3  HGB 10.9* 11.7*  HCT 35.0* 37.2*  PLT 151 166   BMET Recent Labs    10/25/23 0421 10/26/23 0324  NA 137 137  K 3.4* 3.5  CL 106 109  CO2 23 22  GLUCOSE 104* 104*  BUN 44* 27*  CREATININE 1.72* 1.41*  CALCIUM  7.7* 8.2*   PT/INR No results for input(s): LABPROT, INR in the last 72 hours.  Studies/Results: DG Abd Portable 1V-Small Bowel Obstruction Protocol-initial, 8 hr delay Result Date: 10/24/2023 CLINICAL DATA:  Small bowel obstruction, 8 hour delay. EXAM: PORTABLE ABDOMEN - 1 VIEW COMPARISON:  Radiograph earlier today.  CT 10/22/2023 FINDINGS: Administered enteric contrast is seen throughout the entire colon to the level of the rectum. A prominent loop of dilated small bowel persists in the left abdomen however is less dilated than earlier today. The tip of the enteric tube is in the stomach, however side port is in the distal esophagus. No free intra-abdominal air. IMPRESSION: 1. Administered enteric contrast is seen throughout the  entire colon to the level of the rectum. 2. Persistent but diminished dilated loop of small bowel in the left abdomen. 3. Enteric tube tip is in the stomach, however side port is in the distal esophagus. Advancement of 8 cm would place the side-port below the diaphragm. Electronically Signed   By: Andrea Gasman M.D.   On: 10/24/2023 18:12    Anti-infectives: Anti-infectives (From admission, onward)    None       Assessment/Plan:  Patient is an 83 year old male who was admitted with concern for small bowel obstruction.  -Small bowel obstruction protocol demonstrated contrast within the colon at 8 hours -Tolerating soft diet -No acute surgical intervention at this time -Continue bowel regimen outpatient -Stable for discharge from surgical standpoint -Care per hospitalist   LOS: 3 days    Dorlene Footman A Kentrel Clevenger 10/26/2023

## 2023-10-27 ENCOUNTER — Telehealth: Payer: Self-pay

## 2023-10-27 NOTE — Transitions of Care (Post Inpatient/ED Visit) (Signed)
   10/27/2023  Name: [name] MRN: [id number] DOB: [birth year]  Today's TOC FU Call Status: Today's TOC FU Call Status:: Unsuccessful Call (1st Attempt) Unsuccessful Call (1st Attempt) Date: 10/27/23  Attempted to reach the patient regarding the most recent Inpatient/ED visit.Patient was called in an Outreach attempt to offer VBCI  30-day TOC program. Pt is eligible for program due to potential risk for readmission and/or high utilization. Unfortunately, I was not able to speak with the patient in regards to recent hospital discharge Unable to leave a message due to generic voice mail   Follow Up Plan: Additional outreach attempts will be made to reach the patient to complete the Transitions of Care (Post Inpatient/ED visit) call.    Bari Mayans , BSN, RN Care Management Coordinator [place]   [facility] [email or web address] Direct Dial: [phone]

## 2023-10-28 ENCOUNTER — Telehealth: Payer: Self-pay

## 2023-10-28 NOTE — Transitions of Care (Post Inpatient/ED Visit) (Signed)
 10/28/2023  Name: [name] MRN: [id number] DOB: 12/01/1940  Today's TOC FU Call Status: Today's TOC FU Call Status:: Successful TOC FU Call Completed Unsuccessful Call (1st Attempt) Date: 10/27/23 [name] FU Call Complete Date: 10/28/23 Patient's Name and Date of Birth confirmed.  Transition Care Management Follow-up Telephone Call Date of Discharge: 10/26/23 [facility] Type of Discharge: Inpatient Admission Primary Inpatient Discharge Diagnosis:: SBO (small bowel obstruction) How have you been since you were released from the hospital?: Better Any questions or concerns?: No  Items Reviewed: Did you receive and understand the discharge instructions provided?: Yes Medications obtained,verified, and reconciled?: Yes (Medications Reviewed) Any new allergies since your discharge?: No Dietary orders reviewed?: Yes Type of Diet Ordered:: Reg Heart Healthy Do you have support at home?: Yes People in Home: spouse Name of Support/Comfort Primary Source: Wife [name]  Medications Reviewed Today: Medications Reviewed Today     Reviewed by Grafton Lawrence, RN (Registered Nurse) on 10/28/23 at 1340  Med List Status: <None>   Medication Order Taking? Sig Documenting Provider Last Dose Status Informant  acetaminophen  (TYLENOL ) 500 MG tablet [id number] Yes Take 500-1,000 mg by mouth every 6 (six) hours as needed for moderate pain (pain score 4-6). [provider] Taking Active Spouse/Significant Other, Pharmacy Records  aspirin  EC 81 MG tablet [id number] Yes Take 81 mg by mouth daily. Swallow whole. [provider] Taking Active Spouse/Significant Other, Pharmacy Records  atorvastatin  (LIPITOR) 80 MG tablet [id number]  Take 1 tablet (80 mg total) by mouth daily. For stroke Prevention  Patient taking differently: Take 80 mg by mouth at bedtime. For stroke Prevention   Colin Dawley, MD  Expired 10/23/23 2359 Spouse/Significant Other, Pharmacy Records   Multiple Vitamin (MULTIVITAMIN) tablet [id number] Yes Take 1 tablet by mouth daily. [provider] Taking Active Spouse/Significant Other, Pharmacy Records  ondansetron  (ZOFRAN ) 4 MG tablet [id number] No Take 1 tablet (4 mg total) by mouth every 6 (six) hours as needed for nausea.  Patient not taking: Reported on 10/28/2023   Doreene Gammon D, DO Not Taking Active            Med Note Welford Haley, Lilybeth Vien L   Wed Oct 28, 2023  1:38 PM) Per patient not called to pharmacy he denies nausea   pantoprazole  (PROTONIX ) 20 MG tablet [id number] Yes Take 20 mg by mouth daily. [provider] Taking Active Spouse/Significant Other, Pharmacy Records  promethazine  (PHENERGAN ) 25 MG suppository [id number] Yes Place 25 mg rectally every 8 (eight) hours as needed for vomiting or nausea. [provider] Taking Active Spouse/Significant Other, Pharmacy Records  senna-docusate (SENOKOT-S) 8.6-50 MG tablet [id number] Yes Take 1 tablet by mouth at bedtime as needed for mild constipation. Doreene Gammon D, DO Taking Active            Medication reconciliation / review completed based on most recent discharge summary and EHR medication list. Confirmed patient is taking all newly prescribed medications as instructed (any discrepancies are noted in review section)  He did nor receive Zofran  bur denies nausea and stated he will not pick up or follow-up on obtaining at this time  Patient / Caregiver is aware of any changes to and / or  any dosage adjustments to medication regimen. Patient/ Caregiver denies questions at this time and reports no barriers to medication adherence.   Home Care and Equipment/Supplies: Were Home Health Services Ordered?: No Any new equipment or medical supplies ordered?: No  Functional Questionnaire: Do you need assistance with  bathing/showering or dressing?: No Do you need assistance with meal preparation?: No Do you need assistance with eating?: No Do you have difficulty  maintaining continence: No Do you need assistance with getting out of bed/getting out of a chair/moving?: No Do you have difficulty managing or taking your medications?: Yes (Zofran  not called)  Follow up appointments reviewed: PCP Follow-up appointment confirmed?: Yes MD Provider Line Number:[phone] Given: No Date of PCP follow-up appointment?: 11/10/23 Follow-up Provider: [facility] Follow-up appointment confirmed?: NA Do you need transportation to your follow-up appointment?: No (He drives, spouse will transport) Do you understand care options if your condition(s) worsen?: Yes-patient verbalized understanding  Interventions Today    Flowsheet Row Most Recent Value  General Interventions   General Interventions Discussed/Reviewed General Interventions Discussed  Exercise Interventions   Exercise Discussed/Reviewed Exercise Reviewed, Physical Activity  Physical Activity Discussed/Reviewed Physical Activity Reviewed  Nutrition Interventions   Nutrition Discussed/Reviewed Nutrition Reviewed, Portion sizes  Pharmacy Interventions   Pharmacy Dicussed/Reviewed Medications and their functions  Safety Interventions   Safety Discussed/Reviewed Safety Discussed         Patient is at high risk for readmission and/or has history of  high utilization  Discussed VBCI  TOC program and weekly calls to patient to assess condition/status, medication management  and provide support/education as indicated . Patient/ Caregiver voiced understanding and declined enrollment in the 30-day [facility] Program.  Spoke with patient and spouse They did nit feel they needed additional follow-up at thus time. He is upas li, back to usual routine PCP follow-up 10/1296/25     The patient has been provided with contact information for the care management team and has been advised to call with any health related questions or concerns.    James Mcardle , BSN, RN [facility]   [facility] RN  Care Manager Direct Dial [phone] Fax [phone] Website: [email or web address]

## 2023-11-10 DIAGNOSIS — R7301 Impaired fasting glucose: Secondary | ICD-10-CM | POA: Diagnosis not present

## 2023-11-10 DIAGNOSIS — N1832 Chronic kidney disease, stage 3b: Secondary | ICD-10-CM | POA: Diagnosis not present

## 2023-11-10 DIAGNOSIS — E875 Hyperkalemia: Secondary | ICD-10-CM | POA: Diagnosis not present

## 2023-11-10 DIAGNOSIS — D631 Anemia in chronic kidney disease: Secondary | ICD-10-CM | POA: Diagnosis not present

## 2023-11-10 DIAGNOSIS — R17 Unspecified jaundice: Secondary | ICD-10-CM | POA: Diagnosis not present

## 2023-11-10 DIAGNOSIS — D649 Anemia, unspecified: Secondary | ICD-10-CM | POA: Diagnosis not present

## 2023-11-10 DIAGNOSIS — M19011 Primary osteoarthritis, right shoulder: Secondary | ICD-10-CM | POA: Diagnosis not present

## 2023-11-10 DIAGNOSIS — C689 Malignant neoplasm of urinary organ, unspecified: Secondary | ICD-10-CM | POA: Diagnosis not present

## 2023-11-10 DIAGNOSIS — K56609 Unspecified intestinal obstruction, unspecified as to partial versus complete obstruction: Secondary | ICD-10-CM | POA: Diagnosis not present

## 2023-11-10 DIAGNOSIS — R809 Proteinuria, unspecified: Secondary | ICD-10-CM | POA: Diagnosis not present

## 2023-11-10 DIAGNOSIS — E782 Mixed hyperlipidemia: Secondary | ICD-10-CM | POA: Diagnosis not present

## 2024-02-04 DIAGNOSIS — R7301 Impaired fasting glucose: Secondary | ICD-10-CM | POA: Diagnosis not present

## 2024-02-04 DIAGNOSIS — E782 Mixed hyperlipidemia: Secondary | ICD-10-CM | POA: Diagnosis not present

## 2024-02-10 DIAGNOSIS — D649 Anemia, unspecified: Secondary | ICD-10-CM | POA: Diagnosis not present

## 2024-02-10 DIAGNOSIS — R7301 Impaired fasting glucose: Secondary | ICD-10-CM | POA: Diagnosis not present

## 2024-02-10 DIAGNOSIS — M19011 Primary osteoarthritis, right shoulder: Secondary | ICD-10-CM | POA: Diagnosis not present

## 2024-02-10 DIAGNOSIS — N1832 Chronic kidney disease, stage 3b: Secondary | ICD-10-CM | POA: Diagnosis not present

## 2024-02-10 DIAGNOSIS — R17 Unspecified jaundice: Secondary | ICD-10-CM | POA: Diagnosis not present

## 2024-02-10 DIAGNOSIS — E875 Hyperkalemia: Secondary | ICD-10-CM | POA: Diagnosis not present

## 2024-02-10 DIAGNOSIS — C689 Malignant neoplasm of urinary organ, unspecified: Secondary | ICD-10-CM | POA: Diagnosis not present

## 2024-02-10 DIAGNOSIS — E782 Mixed hyperlipidemia: Secondary | ICD-10-CM | POA: Diagnosis not present

## 2024-02-10 DIAGNOSIS — R809 Proteinuria, unspecified: Secondary | ICD-10-CM | POA: Diagnosis not present

## 2024-02-10 DIAGNOSIS — K56609 Unspecified intestinal obstruction, unspecified as to partial versus complete obstruction: Secondary | ICD-10-CM | POA: Diagnosis not present

## 2024-02-10 DIAGNOSIS — D631 Anemia in chronic kidney disease: Secondary | ICD-10-CM | POA: Diagnosis not present

## 2024-08-05 DIAGNOSIS — D649 Anemia, unspecified: Secondary | ICD-10-CM | POA: Diagnosis not present

## 2024-08-05 DIAGNOSIS — N1832 Chronic kidney disease, stage 3b: Secondary | ICD-10-CM | POA: Diagnosis not present

## 2024-08-05 DIAGNOSIS — R7301 Impaired fasting glucose: Secondary | ICD-10-CM | POA: Diagnosis not present

## 2024-08-05 DIAGNOSIS — E782 Mixed hyperlipidemia: Secondary | ICD-10-CM | POA: Diagnosis not present

## 2024-08-11 DIAGNOSIS — E875 Hyperkalemia: Secondary | ICD-10-CM | POA: Diagnosis not present

## 2024-08-11 DIAGNOSIS — D631 Anemia in chronic kidney disease: Secondary | ICD-10-CM | POA: Diagnosis not present

## 2024-08-11 DIAGNOSIS — N1832 Chronic kidney disease, stage 3b: Secondary | ICD-10-CM | POA: Diagnosis not present

## 2024-08-11 DIAGNOSIS — M19011 Primary osteoarthritis, right shoulder: Secondary | ICD-10-CM | POA: Diagnosis not present

## 2024-08-11 DIAGNOSIS — R809 Proteinuria, unspecified: Secondary | ICD-10-CM | POA: Diagnosis not present

## 2024-08-11 DIAGNOSIS — Z23 Encounter for immunization: Secondary | ICD-10-CM | POA: Diagnosis not present

## 2024-08-11 DIAGNOSIS — R7301 Impaired fasting glucose: Secondary | ICD-10-CM | POA: Diagnosis not present

## 2024-08-11 DIAGNOSIS — R17 Unspecified jaundice: Secondary | ICD-10-CM | POA: Diagnosis not present

## 2024-08-11 DIAGNOSIS — E782 Mixed hyperlipidemia: Secondary | ICD-10-CM | POA: Diagnosis not present

## 2024-08-11 DIAGNOSIS — D649 Anemia, unspecified: Secondary | ICD-10-CM | POA: Diagnosis not present

## 2024-08-11 DIAGNOSIS — Z0000 Encounter for general adult medical examination without abnormal findings: Secondary | ICD-10-CM | POA: Diagnosis not present

## 2024-08-11 DIAGNOSIS — K219 Gastro-esophageal reflux disease without esophagitis: Secondary | ICD-10-CM | POA: Diagnosis not present

## 2024-08-12 ENCOUNTER — Ambulatory Visit (HOSPITAL_COMMUNITY)
Admission: RE | Admit: 2024-08-12 | Discharge: 2024-08-12 | Disposition: A | Discharge status: Home / Self Care | Source: Ambulatory Visit | Attending: Urology | Admitting: Urology

## 2024-08-12 ENCOUNTER — Other Ambulatory Visit (HOSPITAL_COMMUNITY): Payer: Self-pay | Admitting: Urology

## 2024-08-12 DIAGNOSIS — C670 Malignant neoplasm of trigone of bladder: Secondary | ICD-10-CM | POA: Insufficient documentation

## 2024-10-31 ENCOUNTER — Encounter (HOSPITAL_COMMUNITY)
Admission: RE | Admit: 2024-10-31 | Discharge: 2024-10-31 | Disposition: A | Discharge status: Home / Self Care | Source: Ambulatory Visit | Attending: Ophthalmology | Admitting: Ophthalmology

## 2024-10-31 ENCOUNTER — Encounter (HOSPITAL_COMMUNITY): Payer: Self-pay

## 2024-10-31 NOTE — Pre-Procedure Instructions (Signed)
 Attempted pre-op phone call. Left VM for him to call us back.

## 2024-11-15 ENCOUNTER — Encounter (HOSPITAL_COMMUNITY)
Admission: RE | Admit: 2024-11-15 | Discharge: 2024-11-15 | Disposition: A | Source: Ambulatory Visit | Attending: Ophthalmology

## 2024-11-15 ENCOUNTER — Encounter (HOSPITAL_COMMUNITY): Payer: Self-pay

## 2024-11-16 ENCOUNTER — Other Ambulatory Visit (HOSPITAL_COMMUNITY)
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
## 2024-11-21 ENCOUNTER — Ambulatory Visit (HOSPITAL_COMMUNITY): Admission: RE | Admit: 2024-11-21 | Admitting: Ophthalmology

## 2024-11-21 ENCOUNTER — Encounter (HOSPITAL_COMMUNITY): Admission: RE | Payer: Self-pay | Source: Home / Self Care

## 2024-11-30 ENCOUNTER — Encounter (HOSPITAL_COMMUNITY)

## 2024-12-14 ENCOUNTER — Other Ambulatory Visit (HOSPITAL_COMMUNITY)

## 2024-12-19 ENCOUNTER — Ambulatory Visit (HOSPITAL_COMMUNITY): Admit: 2024-12-19 | Admitting: Ophthalmology
